# Patient Record
Sex: Female | Born: 1960
Health system: Southern US, Community
[De-identification: ages and names within clinical notes are randomized; demographics above are authoritative.]

## PROBLEM LIST (undated history)

## (undated) DIAGNOSIS — E785 Hyperlipidemia, unspecified: Secondary | ICD-10-CM

## (undated) DIAGNOSIS — N3010 Interstitial cystitis (chronic) without hematuria: Secondary | ICD-10-CM

## (undated) DIAGNOSIS — L259 Unspecified contact dermatitis, unspecified cause: Secondary | ICD-10-CM

## (undated) DIAGNOSIS — R7303 Prediabetes: Secondary | ICD-10-CM

## (undated) DIAGNOSIS — K469 Unspecified abdominal hernia without obstruction or gangrene: Secondary | ICD-10-CM

## (undated) DIAGNOSIS — J309 Allergic rhinitis, unspecified: Secondary | ICD-10-CM

## (undated) DIAGNOSIS — R0789 Other chest pain: Secondary | ICD-10-CM

## (undated) DIAGNOSIS — T7840XA Allergy, unspecified, initial encounter: Secondary | ICD-10-CM

## (undated) DIAGNOSIS — Z86010 Personal history of colonic polyps: Secondary | ICD-10-CM

## (undated) DIAGNOSIS — R400 Somnolence: Secondary | ICD-10-CM

## (undated) DIAGNOSIS — K439 Ventral hernia without obstruction or gangrene: Secondary | ICD-10-CM

## (undated) DIAGNOSIS — J4530 Mild persistent asthma, uncomplicated: Secondary | ICD-10-CM

## (undated) DIAGNOSIS — Z8619 Personal history of other infectious and parasitic diseases: Secondary | ICD-10-CM

## (undated) DIAGNOSIS — G4730 Sleep apnea, unspecified: Secondary | ICD-10-CM

## (undated) DIAGNOSIS — E782 Mixed hyperlipidemia: Secondary | ICD-10-CM

## (undated) DIAGNOSIS — F329 Major depressive disorder, single episode, unspecified: Secondary | ICD-10-CM

## (undated) DIAGNOSIS — J301 Allergic rhinitis due to pollen: Secondary | ICD-10-CM

## (undated) DIAGNOSIS — K219 Gastro-esophageal reflux disease without esophagitis: Secondary | ICD-10-CM

## (undated) HISTORY — PX: POLYPECTOMY: SHX149

## (undated) HISTORY — PX: COLONOSCOPY W/ POLYPECTOMY: SHX1380

## (undated) HISTORY — DX: Major depressive disorder, single episode, unspecified: F32.9

## (undated) HISTORY — PX: CYSTOSCOPY: SUR368

## (undated) HISTORY — DX: Personal history of colonic polyps: Z86.010

## (undated) HISTORY — DX: Allergy, unspecified, initial encounter: T78.40XA

## (undated) HISTORY — DX: Hyperlipidemia, unspecified: E78.5

## (undated) HISTORY — DX: Mixed hyperlipidemia: E78.2

## (undated) HISTORY — DX: Unspecified contact dermatitis, unspecified cause: L25.9

## (undated) HISTORY — DX: Other chest pain: R07.89

## (undated) HISTORY — DX: Gastro-esophageal reflux disease without esophagitis: K21.9

## (undated) HISTORY — DX: Personal history of other infectious and parasitic diseases: Z86.19

## (undated) HISTORY — DX: Sleep apnea, unspecified: G47.30

## (undated) HISTORY — PX: OTHER SURGICAL HISTORY: SHX169

## (undated) HISTORY — DX: Allergic rhinitis due to pollen: J30.1

## (undated) HISTORY — DX: Mild persistent asthma, uncomplicated: J45.30

## (undated) HISTORY — PX: COLONOSCOPY: SHX174

---
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
## 1998-10-08 ENCOUNTER — Emergency Department (HOSPITAL_COMMUNITY): Admission: EM | Admit: 1998-10-08 | Discharge: 1998-10-08 | Payer: Self-pay | Admitting: Emergency Medicine

## 2000-11-12 HISTORY — PX: CHOLECYSTECTOMY: SHX55

## 2001-11-12 HISTORY — PX: HERNIA REPAIR: SHX51

## 2002-02-05 ENCOUNTER — Other Ambulatory Visit: Admission: RE | Admit: 2002-02-05 | Discharge: 2002-02-05 | Payer: Self-pay | Admitting: Obstetrics & Gynecology

## 2002-02-09 ENCOUNTER — Encounter: Admission: RE | Admit: 2002-02-09 | Discharge: 2002-02-09 | Payer: Self-pay | Admitting: Internal Medicine

## 2002-02-09 ENCOUNTER — Encounter: Payer: Self-pay | Admitting: Internal Medicine

## 2002-04-13 ENCOUNTER — Encounter: Payer: Self-pay | Admitting: General Surgery

## 2002-04-13 ENCOUNTER — Encounter (INDEPENDENT_AMBULATORY_CARE_PROVIDER_SITE_OTHER): Payer: Self-pay | Admitting: Specialist

## 2002-04-13 ENCOUNTER — Observation Stay (HOSPITAL_COMMUNITY): Admission: RE | Admit: 2002-04-13 | Discharge: 2002-04-14 | Payer: Self-pay | Admitting: General Surgery

## 2003-09-15 ENCOUNTER — Ambulatory Visit (HOSPITAL_COMMUNITY): Admission: RE | Admit: 2003-09-15 | Discharge: 2003-09-15 | Payer: Self-pay | Admitting: Occupational Medicine

## 2006-03-28 ENCOUNTER — Emergency Department (HOSPITAL_COMMUNITY): Admission: EM | Admit: 2006-03-28 | Discharge: 2006-03-28 | Payer: Self-pay | Admitting: Family Medicine

## 2006-07-19 ENCOUNTER — Emergency Department (HOSPITAL_COMMUNITY): Admission: EM | Admit: 2006-07-19 | Discharge: 2006-07-19 | Payer: Self-pay | Admitting: Emergency Medicine

## 2006-07-25 ENCOUNTER — Ambulatory Visit: Payer: Self-pay | Admitting: Internal Medicine

## 2006-08-15 ENCOUNTER — Ambulatory Visit: Payer: Self-pay | Admitting: Internal Medicine

## 2006-09-24 ENCOUNTER — Ambulatory Visit: Payer: Self-pay | Admitting: Internal Medicine

## 2007-07-22 ENCOUNTER — Ambulatory Visit (HOSPITAL_COMMUNITY): Admission: RE | Admit: 2007-07-22 | Discharge: 2007-07-22 | Payer: Self-pay | Admitting: Family Medicine

## 2008-04-12 ENCOUNTER — Emergency Department (HOSPITAL_COMMUNITY): Admission: EM | Admit: 2008-04-12 | Discharge: 2008-04-12 | Payer: Self-pay | Admitting: Emergency Medicine

## 2008-09-17 ENCOUNTER — Ambulatory Visit (HOSPITAL_BASED_OUTPATIENT_CLINIC_OR_DEPARTMENT_OTHER): Admission: RE | Admit: 2008-09-17 | Discharge: 2008-09-17 | Payer: Self-pay | Admitting: Urology

## 2008-09-17 ENCOUNTER — Encounter (INDEPENDENT_AMBULATORY_CARE_PROVIDER_SITE_OTHER): Payer: Self-pay | Admitting: Urology

## 2011-03-27 NOTE — Op Note (Signed)
Donna Willis, Donna Willis                ACCOUNT NO.:  [id number]   MEDICAL RECORD NO.:  [id number]          PATIENT TYPE:  AMB   LOCATION:  [facility]                         FACILITY:  [facility]   PHYSICIAN:  Ronald L. Earlene Plater, M.D.  DATE OF BIRTH:  1961/10/16   DATE OF PROCEDURE:  09/17/2008  DATE OF DISCHARGE:                               OPERATIVE REPORT   DIAGNOSIS:  Probable interstitial cystitis.   OPERATIVE PROCEDURE:  Cystourethroscopy, hydraulic bladder distention,  bladder biopsies and bladder washings.   SURGEON:  Lucrezia Starch. Earlene Plater, M.D.   ANESTHESIA:  LMA.   FINDINGS:  A 600 mL capacity bladder at 80 cm of water pressure, cracks  and submucosal glomerulation noted.   ESTIMATED BLOOD LOSS:  Minimal.   TUBES:  None.   COMPLICATIONS:  None.   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  She notes that she has had hematuria x2 in  the past.  The situation has progressively worsened.  She has suprapubic  pain relieved by voiding and voids every 2 hours with nocturia times  two.  She has had an NMP-22 in the office which was negative.  She had  renal ultrasound that revealed no significant problems.  After  understanding risks, benefits and alternatives she has elected to  proceed with Cysto, hydraulic bladder distention and bladder biopsy for  both diagnostic and therapeutic purposes.   PROCEDURE IN DETAIL:  The patient was placed in supine position.  After  proper LMA anesthesia was placed in the dorsal lithotomy position and  prepped and draped with Betadine in a sterile fashion.  Cystourethroscopy was performed with a 22.5 French Olympus panendoscope  utilizing the 12 and 70 degrees lenses.  The bladder was carefully  inspected.  Efflux of clear urine was noted from the normally placed  orifices bilaterally.  There was impression that appeared to be a  uterine  impression on the lower bladder.  In the posterior midline there  was some raised reddish areas that appeared to be inflammatory, but  carcinoma in situ was a remote possibility.  Hydraulic bladder  distention was then performed to 80 cm of water pressure.  She was noted  to have a 600 mL bladder capacity and when the pressure was relieved  cracks and glomerulation were noted diffusely.  There was some bleeding  noted from them.  A biopsy was taken of the posterior midline from the  area of maximum  erythema and  the base was cauterized with Bugbee coagulation cautery.  Barbotage  bladder cytologies were then performed with normal saline and submitted  for cytology.  The bladder was drained.  The panendoscope was removed.  The patient was taken to the recovery room stable.      Ronald L. Earlene Plater, M.D.  Electronically Signed     RLD/MEDQ  D:  09/17/2008  T:  09/17/2008  Job:  [id number]

## 2011-03-30 NOTE — Assessment & Plan Note (Signed)
[facility]                           GASTROENTEROLOGY OFFICE NOTE   Donna Willis, Donna Willis                       MRN:          [id number]  DATE:07/25/2006                            DOB:          [birth year]    REFERRING PHYSICIAN:  Jonita Albee, M.D.   OFFICE CONSULTATION:   PROBLEM:  Left-sided abdominal pain for 3 weeks with nausea and fullness.   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  She has a history of reactive airway disease.  She is  status post cholecystectomy in 2003 and had an umbilical hernia in 2003.  Has had one remote C-section.  She has not had any prior GI problems or  prior GI evaluation.  She says her current symptoms started about 3 weeks  ago and has had some intermittent lower abdominal discomfort along with a  constant sense of fullness and swelling or pressure on the left side of her  abdomen.  She does feel that this is exacerbated some by straining with  bowel movements.  She has not noted any change in discomfort or bloating  with p.o. intake though her appetite has been quite poor and she says she  feels full all over her abdomen.  She says she eats a little bit and feels  like it is backing up.  She has been nauseated and has vomited on a few  occasions, though not daily.  Her weight has remained fairly stable.  She  has not had any bowel movement over the past 2 days but prior to that had  been having some intermittent loose stools, no melena or hematochezia.  She  has not had any fever or chills with this illness, no sweats.  She also  mentioned some urinary frequency and pressure type of symptoms.  She has not  had any history of ureterolithiasis.   The patient has had a GYN evaluation since onset of her pain.  She is not  felt to have a GYN etiology.  She underwent CT scan of the abdomen and  pelvis on July 21, 2006, which was really unremarkable.  She  does have a  very small umbilical hernia containing only mesenteric fat.  Bowel loops had  normal appearance.  There was no diverticulosis mentioned, no adenopathy or  ascites.   Laboratory studies on September 11:  Glucose 98, creatinine 0.8.  Liver  function studies normal.  Amylase 42, lipase 22.  WBC of 6.5, hemoglobin 14,  hematocrit of 40, MCV of 85.  Sedimentation rate was 51.  A UA showed light  bacteria, 3-5 wbc's.   The patient was started on a course of Flagyl 500 mg b.i.d. and Bactrim  b.i.d. 4 days ago.  Thus far she has not really noticed any improvement  symptoms, though feels that she has been perhaps more nauseated.   CURRENT MEDICATIONS:  1. Bactrim one p.o. b.i.d.  2. Phenergan 25 mg q.4-6h. p.r.n.  3. Flagyl 500 mg b.i.d.  4. Darvocet-N 100 p.r.n.   ALLERGIES/INTOLERANCES:  PENICILLIN and  CIPRO.  She says she had some  itching with CIPRO once before but has since taken Cipro-E and did fine with  that.   PAST HISTORY:  As outlined above.   SOCIAL HISTORY:  The patient is married, has 1 child.  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  She is a nonsmoker, nondrinker.   FAMILY HISTORY:  Pertinent for diabetes in her grandparents, heart disease  in her grandfather.  Mother had colon polyps.  No family history of  inflammatory bowel disease.  Mother also has been diagnosed with  amyloidosis.   REVIEW OF SYSTEMS:  Entirely negative other than outlined above.   PHYSICAL EXAMINATION:  GENERAL:  A well-developed white female in no acute  distress  VITAL SIGNS:  Height is 5 feet 6 inches, weight is 221, blood pressure  121/78, pulse is 56.  HEENT:  Nontraumatic, normocephalic.  EOMI.  PERRLA.  Sclerae are anicteric.  CARDIOVASCULAR:  Regular rate and rhythm with S1 and S2.  PULMONARY:  Clear to A&P.  ABDOMEN:  Soft, bowel sounds are active.  She is tender in the left  midquadrant, left lower quadrant.  There is no guarding or rebound, no mass  or  palpable hepatosplenomegaly.  No lesions noted.  RECTAL:  Not repeated.  Hemoccult-negative per Dr. Perrin Maltese according to the  patient.   IMPRESSION:  A 50 year old female with 3-week history of left lower  abdominal pain, generalized abdominal fullness and nausea, with negative CT  scan and elevated sedimentation rate.  Etiology of her GI symptoms is not  clear at this time.  There is no evidence for any definite  inflammatory/infectious process on CT scan.  Question viral syndrome.  Question subacute diverticulitis.   PLAN:  1. Finish course of Flagyl 500 mg b.i.d. x14 days and Bactrim b.i.d. x14      days.  2. Phenergan on a p.r.n. basis.  3. Samples of Nexium for 40 mg p.o. q.a.m.  4. Plan return office visit in one week.  She was asked to call in the      interim should her symptoms worsen.  Depending on her response to the      antibiotics, will decide regarding need for colonoscopy plus/minus EGD      at the time of her next visit.                                  Amy Esterwood, PA-C                                Iva Boop, MD,FACG   AE/MedQ  DD:  07/25/2006  DT:  07/26/2006  Job #:  [id number]

## 2011-03-30 NOTE — Op Note (Signed)
[facility]  Patient:    Donna Willis, GARBETT Visit Number: [id number] MRN: [id number]          Service Type: SUR Location: 4W [id number] Attending Physician:  Delsa Bern Dictated by:   Lorne Skeens. Hoxworth, M.D. Proc. Date: 04/13/02 Admit Date:  04/13/2002                             Operative Report  PREOPERATIVE DIAGNOSES: 1. Cholelithiasis. 2. Cholecystitis.  POSTOPERATIVE DIAGNOSES: 1. Cholelithiasis. 2. Cholecystitis. 3. Umbilical hernia.  PROCEDURES: 1. Laparoscopic cholecystectomy with intraoperative cholangiogram. 2. Repair of umbilical hernia.  SURGEON:  Lorne Skeens. Hoxworth, M.D.  ASSISTANT:  Vikki Ports, M.D.  ANESTHESIA:  General.  BRIEF HISTORY:  The patient is a 50 year old white female who recently presented with right flank and back pain to her primary care physician. CT scan was obtained which was unremarkable except for cholelithiasis. On questioning, she has had definite episodes of right upper quadrant, right flank, and right back pain entirely consistent with biliary colic. Laparoscopic cholecystectomy with cholangiogram has been recommended and accepted.  NATURE OF THE PROCEDURE/INDICATIONS:  The risks of bleeding, infection, bile leak, and bile duct injury were discussed and understood. She is now brought to the operating room for the procedure.  PROCEDURE IN DETAIL:  The patient was brought to the operating room and placed in supine position on the operating table and general endotracheal anesthesia was induced. She received preoperative antibiotics. PAS were placed. The abdomen was sterilely prepped and draped. Local anesthesia was used to infiltrate the trocar sites. A 1 cm incision was made at the umbilicus. An umbilical hernia was palpable at this point. Dissection was carried down to the sac which was dissected away from the umbilical skin. This was mainly herniated preperitoneal fat.  Dissection was carried down through this to the peritoneum which was entered. Two stay sutures of 2-0 Vicryl were placed. The Hasson trocar inserted and pneumoperitoneum established. Under direct vision, a 10 mm trocar was placed in the subxiphoid area. Two 5 mm trocars on the right subcostal margin. The gallbladder was visualized and was definitely chronically thickened and inflamed. Some minor omental adhesions were dissected off the infundibulum. The fundus was grasped and elevated above the liver. The infundibulum retracted inferolaterally. Fibrofatty tissue was then stripped off the neck of the gallbladder toward the porta hepatis. The distal gallbladder and enclosed triangle was thoroughly dissected. The cystic artery was seen coursing up under the gallbladder wall. The distal gallbladder cystic duct trench was identified. The cystic duct appeared to be possibly somewhat short with a slightly tented common bile duct in the field. Dissection was kept close to the gallbladder cystic duct junction and the cystic duct was encircled at this point or was smaller in diameter and clipped to the gallbladder junction. The cystic artery was proximally clipped distally. Operative cholangiograms were then obtained through the cystic duct. This showed good filling of the normal small common bile duct, common hepatic duct, and intrahepatic ducts with free flow into the duodenum and no filling defects. Following this, the cholangiocatheter was removed, and the cystic duct was doubly clipped proximally and divided. The cystic artery was divided. The gallbladder was then dissected free from its bed using a hook cautery and removed through the umbilicus. Complete hemostasis was obtained in the gallbladder bed then right upper quadrant irrigated and suctioned until clear. At this point, the fascial edges at the  umbilicus were dissected back about 1 cm in all directions to allow closure. The umbilical  defect was then closed with four interrupted 0 Vicryl sutures transversely. Pneumoperitoneum was reestablished with camera through the subxiphoid port and the area was reinspected and there was no trapped viscera or defects left. The lateral trocars were removed under direct vision and all CO2 evacuated from the peritoneal cavity, and the subxiphoid trocar removed. The skin incisions were closed with interrupted subcuticular 4-0 Monocryl and Steri-Strips. Sponge, instrument, and needle counts were correct. Compressive dressing was applied, and the patient taken to the recovery room in good condition. Dictated by:   Lorne Skeens. Hoxworth, M.D. Attending Physician:  Delsa Bern DD:  04/13/02 TD:  04/14/02 Job: [id number]

## 2011-03-30 NOTE — Assessment & Plan Note (Signed)
[facility]                           GASTROENTEROLOGY OFFICE NOTE   SHAYLON, GILLEAN                       MRN:          [id number]  DATE:09/24/2006                            DOB:          1961/04/22    Miss Bias returns for followup of her abdominal pain and change in bowel  habit syndrome with elevated sed rate. She is completely well at this time.  It is unclear what she had, but presumably she had some sort of infectious  syndrome. Her weight is stable. There are no other issues at this time.   Blood pressure is 110/70, pulse is 64.   ASSESSMENT:  Presumed infectious diarrhea syndrome with left lower quadrant  pain. Resolved.   PLAN:  Followup as needed, routine screening colonoscopy at age 4.   I advised her that if she had recurrent symptoms as sometimes inflammatory  bowel disease could start out this way, she should return to see me sooner  as well. I think she is going to be fine and this is some sort of transient  infectious/inflammatory process.     Iva Boop, MD,FACG  Electronically Signed    CEG/MedQ  DD: 09/24/2006  DT: 09/24/2006  Job #: [id number]   cc:   Jonita Albee, M.D.

## 2011-03-30 NOTE — Assessment & Plan Note (Signed)
[facility]                           GASTROENTEROLOGY OFFICE NOTE   Donna, Willis                       MRN:          [id number]  DATE:08/15/2006                            DOB:          1961/04/10    CHIEF COMPLAINT:  Followup of abdominal pain, change in bowels.   HISTORY OF PRESENT ILLNESS:  Donna Willis was seen to evaluate left lower  quadrant pain and elevated sedimentation rate.  A CT was unrevealing.  When  I saw her about 3-4 weeks ago, she had been treated with Flagyl and Bactrim  by Dr. Perrin Maltese and was given some Nexium.  In that time period, she has  generally improved, but still eating just soup and crackers.  She has lost  some weight down about 6 pounds with the diet change.  There is no pain  anymore.  She has not had fever.  She has some mildly loose stools, but is  not really having any difficulties.  She is on no medication at this time.  She finished her antibiotics.   PHYSICAL EXAMINATION:  GENERAL:  An obese, well-developed, white woman.  She  looks well.  VITAL SIGNS:  Pulse 56, blood pressure 120/78, weight 215.  ABDOMEN:  Soft, nontender without organomegaly, mass or hernia.   ASSESSMENT:  Left lower quadrant pain, nausea which has resolved, although  she has not really been able to advance her diet back to normal.  She had an  elevated sedimentation rate of unclear etiology.  I am not exactly sure what  her syndrome was.  She is only 45.  There are just mild bowel habit changes  and it sounds to me like she may have had some sort of viral syndrome or  infection that is resolving.  She has not had any bleeding or other  worrisome features.  She has lost some weight, but I think that can be  explained with what we know at this point.   PLAN:  1. Recheck a CBC and a sedimentation rate.  2. See me at the end of October.  She is planning a trip to [place] in      early November.  If things change or worsen, she is to call  me back.      As long as she continues to improve, I would not investigate further.       Donna Boop, MD,FACG    CEG/MedQ  DD:  08/17/2006  DT:  08/19/2006  Job #:  [id number]   cc:   Donna Willis, M.D.

## 2011-08-14 LAB — POCT HEMOGLOBIN-HEMACUE: Hemoglobin: 14.3

## 2012-06-29 ENCOUNTER — Ambulatory Visit (INDEPENDENT_AMBULATORY_CARE_PROVIDER_SITE_OTHER): Payer: 59 | Admitting: Emergency Medicine

## 2012-06-29 ENCOUNTER — Ambulatory Visit: Payer: 59

## 2012-06-29 VITALS — BP 121/77 | HR 88 | Temp 98.1°F | Resp 14 | Ht 65.0 in | Wt 231.6 lb

## 2012-06-29 DIAGNOSIS — K219 Gastro-esophageal reflux disease without esophagitis: Secondary | ICD-10-CM

## 2012-06-29 DIAGNOSIS — R05 Cough: Secondary | ICD-10-CM

## 2012-06-29 MED ORDER — OMEPRAZOLE 20 MG PO CPDR: 20.0000 mg | DELAYED_RELEASE_CAPSULE | Freq: Every day | ORAL | Status: DC

## 2012-06-29 MED ORDER — BENZONATATE 100 MG PO CAPS: 100.0000 mg | ORAL_CAPSULE | Freq: Three times a day (TID) | ORAL | Status: AC | PRN

## 2012-06-29 NOTE — Progress Notes (Signed)
  Subjective:    Patient ID: Donna Willis, female    DOB: July 14, 1961, 51 y.o.   MRN: [id number]  HPI patient presents with a cough which has been present for one month. Her cough is nonproductive enhancement associated with a good amount of heartburn she has been taking Pepcid for this. She has tried an albuterol inhaler and this seems to help some    Review of Systems     Objective:   Physical Exam alert cooperative female who is not in distress. Chest is clear. I didn't hear any audible wheezes. There may be a slight prolongation of expiration . There is no murmur audible.   [facility] reading (PRIMARY) by  Dr. Cleta Alberts no acute disease  Results for orders placed during the hospital encounter of 09/17/08  POCT PREGNANCY, URINE      Component Value Range   Preg Test, Ur NEGATIVE    POCT HEMOGLOBIN-HEMACUE      Component Value Range   Hemoglobin 14.3          Assessment  & Plan:     I suspect her cough is secondary to reflux. We'll treat with omeprazole 20 one a day along with her Pepcid one a day have given her instructions regarding treatment of her cough and treatment for reflux.

## 2012-06-29 NOTE — Patient Instructions (Signed)
Gastroesophageal Reflux Disease, Adult Gastroesophageal reflux disease (GERD) happens when acid from your stomach flows up into the esophagus. When acid comes in contact with the esophagus, the acid causes soreness (inflammation) in the esophagus. Over time, GERD may create small holes (ulcers) in the lining of the esophagus. CAUSES   Increased body weight. This puts pressure on the stomach, making acid rise from the stomach into the esophagus.   Smoking. This increases acid production in the stomach.   Drinking alcohol. This causes decreased pressure in the lower esophageal sphincter (valve or ring of muscle between the esophagus and stomach), allowing acid from the stomach into the esophagus.   Late evening meals and a full stomach. This increases pressure and acid production in the stomach.   A malformed lower esophageal sphincter.  Sometimes, no cause is found. SYMPTOMS   Burning pain in the lower part of the mid-chest behind the breastbone and in the mid-stomach area. This may occur twice a week or more often.   Trouble swallowing.   Sore throat.   Dry cough.   Asthma-like symptoms including chest tightness, shortness of breath, or wheezing.  DIAGNOSIS  Your caregiver may be able to diagnose GERD based on your symptoms. In some cases, X-rays and other tests may be done to check for complications or to check the condition of your stomach and esophagus. TREATMENT  Your caregiver may recommend over-the-counter or prescription medicines to help decrease acid production. Ask your caregiver before starting or adding any new medicines.  HOME CARE INSTRUCTIONS   Change the factors that you can control. Ask your caregiver for guidance concerning weight loss, quitting smoking, and alcohol consumption.   Avoid foods and drinks that make your symptoms worse, such as:   Caffeine or alcoholic drinks.   Chocolate.   Peppermint or mint flavorings.   Garlic and onions.   Spicy foods.     Citrus fruits, such as oranges, lemons, or limes.   Tomato-based foods such as sauce, chili, salsa, and pizza.   Fried and fatty foods.   Avoid lying down for the 3 hours prior to your bedtime or prior to taking a nap.   Eat small, frequent meals instead of large meals.   Wear loose-fitting clothing. Do not wear anything tight around your waist that causes pressure on your stomach.   Raise the head of your bed 6 to 8 inches with wood blocks to help you sleep. Extra pillows will not help.   Only take over-the-counter or prescription medicines for pain, discomfort, or fever as directed by your caregiver.   Do not take aspirin, ibuprofen, or other nonsteroidal anti-inflammatory drugs (NSAIDs).  SEEK IMMEDIATE MEDICAL CARE IF:   You have pain in your arms, neck, jaw, teeth, or back.   Your pain increases or changes in intensity or duration.   You develop nausea, vomiting, or sweating (diaphoresis).   You develop shortness of breath, or you faint.   Your vomit is green, yellow, black, or looks like coffee grounds or blood.   Your stool is red, bloody, or black.  These symptoms could be signs of other problems, such as heart disease, gastric bleeding, or esophageal bleeding. MAKE SURE YOU:   Understand these instructions.   Will watch your condition.   Will get help right away if you are not doing well or get worse.  Document Released: 08/08/2005 Document Revised: 10/18/2011 Document Reviewed: 05/18/2011 [facility] Patient Information 2012 [place], [place].Cough, Adult  A cough is a reflex that helps  clear your throat and airways. It can help heal the body or may be a reaction to an irritated airway. A cough may only last 2 or 3 weeks (acute) or may last more than 8 weeks (chronic).  CAUSES Acute cough:  Viral or bacterial infections.  Chronic cough:  Infections.   Allergies.   Asthma.   Post-nasal drip.   Smoking.   Heartburn or acid reflux.   Some medicines.    Chronic lung problems (COPD).   Cancer.  SYMPTOMS   Cough.   Fever.   Chest pain.   Increased breathing rate.   High-pitched whistling sound when breathing (wheezing).   Colored mucus that you cough up (sputum).  TREATMENT   A bacterial cough may be treated with antibiotic medicine.   A viral cough must run its course and will not respond to antibiotics.   Your caregiver may recommend other treatments if you have a chronic cough.  HOME CARE INSTRUCTIONS   Only take over-the-counter or prescription medicines for pain, discomfort, or fever as directed by your caregiver. Use cough suppressants only as directed by your caregiver.   Use a cold steam vaporizer or humidifier in your bedroom or home to help loosen secretions.   Sleep in a semi-upright position if your cough is worse at night.   Rest as needed.   Stop smoking if you smoke.  SEEK IMMEDIATE MEDICAL CARE IF:   You have pus in your sputum.   Your cough starts to worsen.   You cannot control your cough with suppressants and are losing sleep.   You begin coughing up blood.   You have difficulty breathing.   You develop pain which is getting worse or is uncontrolled with medicine.   You have a fever.  MAKE SURE YOU:   Understand these instructions.   Will watch your condition.   Will get help right away if you are not doing well or get worse.  Document Released: 04/27/2011 Document Revised: 10/18/2011 Document Reviewed: 04/27/2011 [facility] Patient Information 2012 [place], [place].

## 2012-08-12 DIAGNOSIS — R0789 Other chest pain: Secondary | ICD-10-CM

## 2012-08-12 HISTORY — DX: Other chest pain: R07.89

## 2012-08-18 ENCOUNTER — Ambulatory Visit (INDEPENDENT_AMBULATORY_CARE_PROVIDER_SITE_OTHER): Payer: 59 | Admitting: Family Medicine

## 2012-08-18 ENCOUNTER — Inpatient Hospital Stay (HOSPITAL_COMMUNITY)
Admission: EM | Admit: 2012-08-18 | Discharge: 2012-08-21 | DRG: 355 | Disposition: A | Discharge status: Home / Self Care | Payer: 59 | Attending: General Surgery | Admitting: General Surgery

## 2012-08-18 ENCOUNTER — Encounter (HOSPITAL_COMMUNITY): Payer: Self-pay

## 2012-08-18 ENCOUNTER — Telehealth: Payer: Self-pay

## 2012-08-18 VITALS — BP 148/82 | HR 78 | Temp 98.2°F | Resp 17 | Ht 65.0 in | Wt 235.0 lb

## 2012-08-18 DIAGNOSIS — K420 Umbilical hernia with obstruction, without gangrene: Secondary | ICD-10-CM

## 2012-08-18 DIAGNOSIS — K219 Gastro-esophageal reflux disease without esophagitis: Secondary | ICD-10-CM | POA: Diagnosis present

## 2012-08-18 DIAGNOSIS — N3010 Interstitial cystitis (chronic) without hematuria: Secondary | ICD-10-CM | POA: Diagnosis present

## 2012-08-18 DIAGNOSIS — Z6839 Body mass index (BMI) 39.0-39.9, adult: Secondary | ICD-10-CM

## 2012-08-18 DIAGNOSIS — R109 Unspecified abdominal pain: Secondary | ICD-10-CM

## 2012-08-18 DIAGNOSIS — K439 Ventral hernia without obstruction or gangrene: Secondary | ICD-10-CM

## 2012-08-18 DIAGNOSIS — Z23 Encounter for immunization: Secondary | ICD-10-CM

## 2012-08-18 HISTORY — DX: Unspecified abdominal hernia without obstruction or gangrene: K46.9

## 2012-08-18 HISTORY — DX: Interstitial cystitis (chronic) without hematuria: N30.10

## 2012-08-18 LAB — POCT UA - MICROSCOPIC ONLY
Casts, Ur, LPF, POC: NEGATIVE
Crystals, Ur, HPF, POC: NEGATIVE
Mucus, UA: NEGATIVE

## 2012-08-18 LAB — POCT URINALYSIS DIPSTICK
Ketones, UA: NEGATIVE
Protein, UA: NEGATIVE
Spec Grav, UA: 1.015
pH, UA: 6

## 2012-08-18 LAB — POCT CBC
HCT, POC: 43.7 % (ref 37.7–47.9)
Hemoglobin: 13.2 g/dL (ref 12.2–16.2)
MCH, POC: 26.5 pg — AB (ref 27–31.2)
MCV: 87.7 fL (ref 80–97)
RBC: 4.98 M/uL (ref 4.04–5.48)
WBC: 7.4 10*3/uL (ref 4.6–10.2)

## 2012-08-18 LAB — BASIC METABOLIC PANEL
CO2: 23 mEq/L (ref 19–32)
Calcium: 9.1 mg/dL (ref 8.4–10.5)
Glucose, Bld: 100 mg/dL — ABNORMAL HIGH (ref 70–99)
Sodium: 138 mEq/L (ref 135–145)

## 2012-08-18 LAB — SURGICAL PCR SCREEN
MRSA, PCR: NEGATIVE
Staphylococcus aureus: NEGATIVE

## 2012-08-18 MED ORDER — INFLUENZA VIRUS VACC SPLIT PF IM SUSP
0.5000 mL | INTRAMUSCULAR | Status: AC
  Administered 2012-08-19: 0.5 mL via INTRAMUSCULAR
  Filled 2012-08-18: qty 0.5

## 2012-08-18 MED ORDER — OXYCODONE-ACETAMINOPHEN 5-325 MG PO TABS: 1.0000 | ORAL_TABLET | ORAL | Status: DC | PRN

## 2012-08-18 MED ORDER — ONDANSETRON HCL 4 MG/2ML IJ SOLN: 4.0000 mg | Freq: Four times a day (QID) | INTRAMUSCULAR | Status: DC | PRN

## 2012-08-18 MED ORDER — MORPHINE SULFATE 2 MG/ML IJ SOLN
1.0000 mg | INTRAMUSCULAR | Status: DC | PRN
  Administered 2012-08-18: 2 mg via INTRAMUSCULAR

## 2012-08-18 MED ORDER — MORPHINE SULFATE 2 MG/ML IJ SOLN
1.0000 mg | INTRAMUSCULAR | Status: DC | PRN
  Administered 2012-08-18 – 2012-08-19 (×2): 2 mg via INTRAVENOUS
  Filled 2012-08-18 (×3): qty 1

## 2012-08-18 MED ORDER — KCL IN DEXTROSE-NACL 20-5-0.45 MEQ/L-%-% IV SOLN
INTRAVENOUS | Status: DC
  Administered 2012-08-19: 06:00:00 via INTRAVENOUS
  Filled 2012-08-18 (×4): qty 1000

## 2012-08-18 MED ORDER — DIPHENHYDRAMINE HCL 12.5 MG/5ML PO ELIX: 12.5000 mg | ORAL_SOLUTION | Freq: Four times a day (QID) | ORAL | Status: DC | PRN

## 2012-08-18 MED ORDER — DEXTROSE 5 % IV SOLN
2.0000 g | INTRAVENOUS | Status: AC
  Filled 2012-08-18: qty 2

## 2012-08-18 MED ORDER — DIPHENHYDRAMINE HCL 50 MG/ML IJ SOLN: 12.5000 mg | Freq: Four times a day (QID) | INTRAMUSCULAR | Status: DC | PRN

## 2012-08-18 MED ORDER — PANTOPRAZOLE SODIUM 40 MG IV SOLR
40.0000 mg | Freq: Every day | INTRAVENOUS | Status: DC
  Administered 2012-08-18 – 2012-08-20 (×3): 40 mg via INTRAVENOUS
  Filled 2012-08-18 (×4): qty 40

## 2012-08-18 NOTE — Telephone Encounter (Signed)
She was sent to [facility] ER today , hopefully he has found her. I have left message for him to call me.

## 2012-08-18 NOTE — H&P (Signed)
Donna Willis 1961-02-26  [id number].   Room - 1527 PCP - [facility] - Dr. Renato Shin Uro - Dr. Vania Rea  Chief Complaint/Reason for Consult: abdominal pain with hernia HPI: This is a 51 year old female who underwent a laparoscopic cholecystectomy in 2002. At that time, she was noted to have an umbilical hernia which was primarily repaired during the closure of her umbilical incision. She has been doing well since that time. Over the last several months she has noted a bulge, consistent with a hernia that has not had any issues with this. Yesterday he she began noticing some abdominal pain at this location. She ate solid food for lunch time but only a popsicle at dinner secondary to worsening abdominal pain. She did have a bowel movement yesterday, but states this was a little bit loose. Her pain persisted overnight. She has had worsening nausea but no emesis. She went to urgent care today where she was found to have an incarcerated umbilical hernia. We were contacted, and informed the urgent care to some the patient urgently to [facility] emergency department.  Review of systems: Please see history of present illness, otherwise all other systems have been reviewed and are negative.  Family History  Problem Relation Age of Onset  . Colon polyps Mother   . Diverticulitis Father     Past Medical History  Diagnosis Date  . GERD (gastroesophageal reflux disease)   . Hernia   . Interstitial cystitis     Past Surgical History  Procedure Date  . Cholecystectomy   . Cesarean section   . Tonsillectomy and adenoidectomy   . Hernia repair     umbilical, during lap chole    Social History:  reports that she has never smoked. She has never used smokeless tobacco. She reports that she drinks alcohol. She reports that she does not use illicit drugs.  Allergies:  Allergies  Allergen Reactions  . Ciprofloxacin Itching  . Penicillins Itching     (Not in a hospital  admission)  Blood pressure 131/76, pulse 70, temperature 98.2 F (36.8 C), temperature source Oral, resp. rate 18, last menstrual period 08/04/2012, SpO2 100.00%. Physical Exam: General: pleasant, WD, WN white female who is laying in bed in NAD HEENT: head is normocephalic, atraumatic.  Sclera are noninjected.  PERRL.  Ears and nose without any masses or lesions.  Mouth is pink and moist Heart: regular, rate, and rhythm.  Normal s1,s2. No obvious murmurs, gallops, or rubs noted.  Palpable radial and pedal pulses bilaterally Lungs: CTAB, no wheezes, rhonchi, or rales noted.  Respiratory effort nonlabored Abd: soft, mild distention, hypoactive BS, no masses or organomegaly. The patient has a moderate size incarcerated umbilical hernia. This is very tender and unable to be reduced secondary to pain. This is still mildly soft and not hard yet. There is no erythematous changes to her skin. MS: all 4 extremities are symmetrical with no cyanosis, clubbing, or edema. Skin: warm and dry with no masses, lesions, or rashes Psych: A&Ox3 with an appropriate affect.    Results for orders placed in visit on 08/18/12 (from the past 48 hour(s))  POCT CBC     Status: Abnormal   Collection Time   08/18/12 11:38 AM      Component Value Range Comment   WBC 7.4  4.6 - 10.2 K/uL    Lymph, poc 1.6  0.6 - 3.4    POC LYMPH PERCENT 21.9  10 - 50 %L  MID (cbc) 0.3  0 - 0.9    POC MID % 4.6  0 - 12 %M    POC Granulocyte 5.4  2 - 6.9    Granulocyte percent 73.5  37 - 80 %G    RBC 4.98  4.04 - 5.48 M/uL    Hemoglobin 13.2  12.2 - 16.2 g/dL    HCT, POC 19.1  47.8 - 47.9 %    MCV 87.7  80 - 97 fL    MCH, POC 26.5 (*) 27 - 31.2 pg    MCHC 30.2 (*) 31.8 - 35.4 g/dL    RDW, POC 29.5      Platelet Count, POC 299  142 - 424 K/uL    MPV 9.1  0 - 99.8 fL   POCT UA - MICROSCOPIC ONLY     Status: Normal   Collection Time   08/18/12 11:38 AM      Component Value Range Comment   WBC, Ur, HPF, POC 0-1      RBC, urine,  microscopic 0-1      Bacteria, U Microscopic tr      Mucus, UA neg      Epithelial cells, urine per micros 8-12      Crystals, Ur, HPF, POC neg      Casts, Ur, LPF, POC neg      Yeast, UA neg     POCT URINE PREGNANCY     Status: Normal   Collection Time   08/18/12 11:38 AM      Component Value Range Comment   Preg Test, Ur Negative     POCT URINALYSIS DIPSTICK     Status: Normal   Collection Time   08/18/12 11:47 AM      Component Value Range Comment   Color, UA yellow      Clarity, UA hazy      Glucose, UA neg      Bilirubin, UA neg      Ketones, UA neg      Spec Grav, UA 1.015      Blood, UA neg      pH, UA 6.0      Protein, UA neg      Urobilinogen, UA 0.2      Nitrite, UA neg      Leukocytes, UA Negative      No results found.  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  Assessment/Plan 1. incarcerated umbilical hernia 2. History of interstitial cystitis  Seen by Dr. Monico Blitz in the past, but has not seen him in 2+ years. 3. History of reactive airway disease 4.  Treated for GERD by Dr. Cleta Alberts  Plan: We will get the patient admitted. We will keep her n.p.o. in preparation for possible surgical intervention tonight versus surgery in the morning. We'll give her when necessary medicines for pain and nausea. I discussed this with the patient and she understands.  OSBORNE,KELLY E 08/18/2012, 3:40 PM Pager: [phone]  To discuss with Dr. Corliss Skains, who is call coverage tonight.  If not operated on tonight, I will do her surgery tomorrow.  I discussed the indications and complications of hernia surgery with the patient.  I discussed both the laparoscopic and open approach to hernia repair..  The potential risks of hernia surgery include, but are not limited to, bleeding, infection, open surgery, nerve injury, and recurrence of the hernia. Her husband and son were at the bedside.   Ovidio Kin, MD, [facility] Surgery Pager: [phone]  Office phone:   [phone]

## 2012-08-18 NOTE — ED Notes (Signed)
Dr. Ezzard Standing is currently performing a surgery on another patient in the hospital, when he is finished with his current patient, he will decide whether he will do surgery on Mrs. Funchess today or Maybe tomorrow. Dr. Ezzard Standing do want this patient to be admitted.

## 2012-08-18 NOTE — ED Notes (Signed)
Kelly with [facility] notified of patient's location in the ED.

## 2012-08-18 NOTE — ED Notes (Signed)
Pt is pending for incarcerated hernia, Tresa Endo PA from [place] surgery has seem this patient and sts this pt will be going to OR.

## 2012-08-18 NOTE — Progress Notes (Signed)
Subjective:    Patient ID: Donna Willis, female    DOB: 1961/02/03, 51 y.o.   MRN: [id number]  HPI  [name] is a 51 yr old female here with a 12 hour history of abdominal pain.  The pain started last evening and is worst around her belly button, but it feels like her "whole stomach is tight".  She describes the pain is sharp and "pulling".  She had some nausea this morning for which she took Zofran.  She has not had vomiting.  She endorses some loose stools yesterday.  Her last normal bowel movement was Saturday morning.  She denies constipation.  There has been no blood or mucus in the stool.  Appetite has been decreased, but eating does not worsen her symptoms.  The pain is somewhat positional.  She had difficulty finding a comfortable position to sleep in.  Lying flat feels best.  Bending over causes pain.  She has had some back pain today located in the right flank.  She denies problems urinating.  She has a history of interstitial cystitis, but denies any hematuria.  She denies vaginal discharge or concern for STI.  She denies the possibility of pregnancy.  LMP about 2 weeks ago.  She had a similar episode about 7 years ago.  At the time it was thought she has diverticulosis.  She was seen by GI who felt that it was likely a GI bug.  She has not yet had a screening colonoscopy.  She had a cholecystectomy and umbilical hernia repair in 2003.   Review of Systems  Constitutional: Positive for appetite change. Negative for fever and chills.  HENT: Negative.   Respiratory: Negative.   Cardiovascular: Negative.   Genitourinary: Negative.   Musculoskeletal: Positive for back pain (right flank).  Neurological: Positive for dizziness. Negative for light-headedness and headaches.       Objective:   Physical Exam  Vitals reviewed. Constitutional: She is oriented to person, place, and time. She appears well-developed and well-nourished. No distress.  Cardiovascular: Normal rate, regular rhythm  and normal heart sounds.  Exam reveals no gallop and no friction rub.   No murmur heard. Pulmonary/Chest: Breath sounds normal. She has no wheezes. She has no rales.  Abdominal: Soft. She exhibits no distension. Bowel sounds are decreased. There is no hepatosplenomegaly. There is tenderness in the periumbilical area. There is no rigidity, no rebound, no guarding, no CVA tenderness, no tenderness at McBurney's point and negative Murphy's sign. A hernia is present. Hernia confirmed positive in the ventral area.       Rosving's and obturator signs are negative  Neurological: She is alert and oriented to person, place, and time.  Skin: Skin is warm and dry.     Results for orders placed in visit on 08/18/12  POCT CBC      Component Value Range   WBC 7.4  4.6 - 10.2 K/uL   Lymph, poc 1.6  0.6 - 3.4   POC LYMPH PERCENT 21.9  10 - 50 %L   MID (cbc) 0.3  0 - 0.9   POC MID % 4.6  0 - 12 %M   POC Granulocyte 5.4  2 - 6.9   Granulocyte percent 73.5  37 - 80 %G   RBC 4.98  4.04 - 5.48 M/uL   Hemoglobin 13.2  12.2 - 16.2 g/dL   HCT, POC 40.9  81.1 - 47.9 %   MCV 87.7  80 - 97 fL   MCH, POC  26.5 (*) 27 - 31.2 pg   MCHC 30.2 (*) 31.8 - 35.4 g/dL   RDW, POC 16.1     Platelet Count, POC 299  142 - 424 K/uL   MPV 9.1  0 - 99.8 fL  POCT UA - MICROSCOPIC ONLY      Component Value Range   WBC, Ur, HPF, POC 0-1     RBC, urine, microscopic 0-1     Bacteria, U Microscopic tr     Mucus, UA neg     Epithelial cells, urine per micros 8-12     Crystals, Ur, HPF, POC neg     Casts, Ur, LPF, POC neg     Yeast, UA neg    POCT URINE PREGNANCY      Component Value Range   Preg Test, Ur Negative    POCT URINALYSIS DIPSTICK      Component Value Range   Color, UA yellow     Clarity, UA hazy     Glucose, UA neg     Bilirubin, UA neg     Ketones, UA neg     Spec Grav, UA 1.015     Blood, UA neg     pH, UA 6.0     Protein, UA neg     Urobilinogen, UA 0.2     Nitrite, UA neg     Leukocytes, UA  Negative          Assessment & Plan:  [name] is a 51 yr old female with 12 hour history of abdominal pain.  Her CBC and UA are both normal.  Urine pregnancy test is negative.  She has a known ventral hernia that she states is bulging out more than usual.  It is not reducible manually, and causes her extreme pain to attempt reduction.  Discussed the case with Dr. Alwyn Ren, who also examined the patient.  Dr. Alwyn Ren spoke with surgeon Dr. Ovidio Kin who asked that the patient proceed to directly [facility] ED for immediate consultation.  The patient is in agreement with this plan and will proceed by private car to [facility] ED.  The ED has been alerted to her arrival and will contact Dr. Ezzard Standing when she arrives.

## 2012-08-18 NOTE — Progress Notes (Signed)
Subjective patient was having abdominal pain from her umbilical hernia. The physician assistant asked me to see the patient also. She's been hurting badly since yesterday. She's had the hernia for some time, but this is significantly different. Did not do anything that made it popped out further than usual.  Objective: Umbilical hernia just below the umbilicus a larger than my fist. With gentle pressure I attempted to reduce it, but was unsuccessful. It caused a moderate amount of pain and on doing so.  Assessment: Umbilical herniation with incarceration  Plan: Spoke to the general surgeon Dr. Ovidio Kin. He denies recent the patient to [facility] long ER to be seen by the surgeon.

## 2012-08-18 NOTE — Telephone Encounter (Signed)
Spouse called looking for wife in concern to her health (he is on her medical release form) please call for his questions in regard to patient. Thank you!

## 2012-08-18 NOTE — Progress Notes (Signed)
I have called Diane at [facility] to advise her Dr Ezzard Standing needs to be paged upon patients arrival to [facility] Emergency Department.

## 2012-08-18 NOTE — Anesthesia Preprocedure Evaluation (Addendum)
Anesthesia Evaluation  Patient identified by MRN, date of birth, ID band Patient awake    Reviewed: Allergy & Precautions, H&P , NPO status , Patient's Chart, lab work & pertinent test results  Airway Mallampati: II TM Distance: >3 FB Neck ROM: Full    Dental  (+) Teeth Intact and Caps,    Pulmonary neg pulmonary ROS,  breath sounds clear to auscultation  Pulmonary exam normal       Cardiovascular - CAD, - Past MI and - CHF Rhythm:Regular Rate:Normal     Neuro/Psych negative neurological ROS  negative psych ROS   GI/Hepatic Neg liver ROS, GERD-  Medicated,  Endo/Other  negative endocrine ROSMorbid obesity  Renal/GU negative Renal ROS     Musculoskeletal negative musculoskeletal ROS (+)   Abdominal (+) + obese,   Peds  Hematology negative hematology ROS (+)   Anesthesia Other Findings   Reproductive/Obstetrics                          Anesthesia Physical Anesthesia Plan  ASA: II  Anesthesia Plan: General   Post-op Pain Management:    Induction: Intravenous and Rapid sequence  Airway Management Planned: Oral ETT  Additional Equipment:   Intra-op Plan:   Post-operative Plan: Extubation in OR  Informed Consent: I have reviewed the patients History and Physical, chart, labs and discussed the procedure including the risks, benefits and alternatives for the proposed anesthesia with the patient or authorized representative who has indicated his/her understanding and acceptance.   Dental advisory given  Plan Discussed with: CRNA  Anesthesia Plan Comments:         Anesthesia Quick Evaluation

## 2012-08-18 NOTE — ED Notes (Signed)
Patient  States that she began having mid abdominal pain last night. Patient went to Urgent Care and they in turn call [facility]. Dr. Ezzard Standing is to see patient in the ED. Patient also c/o nausea, but denies diarrhea, vomiting , or fever.

## 2012-08-18 NOTE — Progress Notes (Signed)
Screen opened on error

## 2012-08-18 NOTE — Patient Instructions (Addendum)
We have spoken to Dr Ezzard Standing at [facility], he is going to meet you in the ER< I have spoken to Diane at [facility] ED and she will page Dr Ezzard Standing upon your arrival to [facility] Emergency

## 2012-08-19 ENCOUNTER — Inpatient Hospital Stay (HOSPITAL_COMMUNITY): Payer: 59 | Admitting: Anesthesiology

## 2012-08-19 ENCOUNTER — Encounter (HOSPITAL_COMMUNITY): Payer: Self-pay | Admitting: Anesthesiology

## 2012-08-19 ENCOUNTER — Encounter (HOSPITAL_COMMUNITY): Admission: EM | Disposition: A | Discharge status: Home / Self Care | Payer: Self-pay | Source: Home / Self Care

## 2012-08-19 HISTORY — PX: UMBILICAL HERNIA REPAIR: SHX196

## 2012-08-19 LAB — BASIC METABOLIC PANEL
Calcium: 8.6 mg/dL (ref 8.4–10.5)
Chloride: 104 mEq/L (ref 96–112)
Creatinine, Ser: 0.66 mg/dL (ref 0.50–1.10)
GFR calc Af Amer: >90 mL/min (ref >90)
GFR calc non Af Amer: >90 mL/min (ref >90)

## 2012-08-19 LAB — CBC
HCT: 35.8 % — ABNORMAL LOW (ref 36.0–46.0)
Platelets: 215 10*3/uL (ref 150–400)
RDW: 14.1 % (ref 11.5–15.5)
WBC: 6.6 10*3/uL (ref 4.0–10.5)

## 2012-08-19 SURGERY — REPAIR, HERNIA, UMBILICAL, LAPAROSCOPIC
Anesthesia: General | Site: Abdomen | Wound class: Clean

## 2012-08-19 MED ORDER — FENTANYL CITRATE 0.05 MG/ML IJ SOLN
INTRAMUSCULAR | Status: DC | PRN
  Administered 2012-08-19 (×2): 50 ug via INTRAVENOUS
  Administered 2012-08-19: 100 ug via INTRAVENOUS
  Administered 2012-08-19: 50 ug via INTRAVENOUS

## 2012-08-19 MED ORDER — ENOXAPARIN SODIUM 40 MG/0.4ML ~~LOC~~ SOLN
40.0000 mg | SUBCUTANEOUS | Status: DC
  Administered 2012-08-19 – 2012-08-20 (×2): 40 mg via SUBCUTANEOUS
  Filled 2012-08-19 (×3): qty 0.4

## 2012-08-19 MED ORDER — GLYCOPYRROLATE 0.2 MG/ML IJ SOLN
INTRAMUSCULAR | Status: DC | PRN
  Administered 2012-08-19: 0.6 mg via INTRAVENOUS

## 2012-08-19 MED ORDER — LABETALOL HCL 5 MG/ML IV SOLN
INTRAVENOUS | Status: DC | PRN
  Administered 2012-08-19: 5 mg via INTRAVENOUS

## 2012-08-19 MED ORDER — HYDROCODONE-ACETAMINOPHEN 5-325 MG PO TABS
1.0000 | ORAL_TABLET | ORAL | Status: DC | PRN
  Administered 2012-08-19 – 2012-08-21 (×7): 2 via ORAL
  Filled 2012-08-19 (×7): qty 2

## 2012-08-19 MED ORDER — NEOSTIGMINE METHYLSULFATE 1 MG/ML IJ SOLN
INTRAMUSCULAR | Status: DC | PRN
  Administered 2012-08-19: 5 mg via INTRAVENOUS

## 2012-08-19 MED ORDER — MORPHINE SULFATE 2 MG/ML IJ SOLN
1.0000 mg | INTRAMUSCULAR | Status: DC | PRN
  Administered 2012-08-19 (×3): 2 mg via INTRAVENOUS
  Filled 2012-08-19 (×3): qty 1

## 2012-08-19 MED ORDER — BUPIVACAINE HCL (PF) 0.25 % IJ SOLN
INTRAMUSCULAR | Status: DC | PRN
  Administered 2012-08-19: 20 mL

## 2012-08-19 MED ORDER — ONDANSETRON HCL 4 MG PO TABS
4.0000 mg | ORAL_TABLET | Freq: Four times a day (QID) | ORAL | Status: DC | PRN
  Administered 2012-08-20: 4 mg via ORAL
  Filled 2012-08-19: qty 1

## 2012-08-19 MED ORDER — ACETAMINOPHEN 10 MG/ML IV SOLN
INTRAVENOUS | Status: DC | PRN
  Administered 2012-08-19: 1000 mg via INTRAVENOUS

## 2012-08-19 MED ORDER — LACTATED RINGERS IV SOLN
INTRAVENOUS | Status: DC
  Administered 2012-08-19: 1000 mL via INTRAVENOUS

## 2012-08-19 MED ORDER — CEFAZOLIN SODIUM-DEXTROSE 2-3 GM-% IV SOLR
INTRAVENOUS | Status: DC | PRN
  Administered 2012-08-19: 2 g via INTRAVENOUS

## 2012-08-19 MED ORDER — ROCURONIUM BROMIDE 100 MG/10ML IV SOLN
INTRAVENOUS | Status: DC | PRN
  Administered 2012-08-19: 35 mg via INTRAVENOUS

## 2012-08-19 MED ORDER — KCL IN DEXTROSE-NACL 20-5-0.45 MEQ/L-%-% IV SOLN
INTRAVENOUS | Status: DC
  Administered 2012-08-19 – 2012-08-21 (×4): via INTRAVENOUS
  Filled 2012-08-19 (×6): qty 1000

## 2012-08-19 MED ORDER — ONDANSETRON HCL 4 MG/2ML IJ SOLN
INTRAMUSCULAR | Status: DC | PRN
  Administered 2012-08-19: 4 mg via INTRAVENOUS

## 2012-08-19 MED ORDER — IBUPROFEN 600 MG PO TABS
600.0000 mg | ORAL_TABLET | Freq: Four times a day (QID) | ORAL | Status: DC | PRN
  Administered 2012-08-21 (×2): 600 mg via ORAL
  Filled 2012-08-19 (×2): qty 1

## 2012-08-19 MED ORDER — PROPOFOL 10 MG/ML IV BOLUS
INTRAVENOUS | Status: DC | PRN
  Administered 2012-08-19: 150 mg via INTRAVENOUS

## 2012-08-19 MED ORDER — LACTATED RINGERS IV SOLN: INTRAVENOUS | Status: DC

## 2012-08-19 MED ORDER — HYDRALAZINE HCL 20 MG/ML IJ SOLN
INTRAMUSCULAR | Status: DC | PRN
  Administered 2012-08-19: 5 mg via INTRAVENOUS

## 2012-08-19 MED ORDER — ONDANSETRON HCL 4 MG/2ML IJ SOLN
4.0000 mg | Freq: Four times a day (QID) | INTRAMUSCULAR | Status: DC | PRN
  Administered 2012-08-19 – 2012-08-21 (×4): 4 mg via INTRAVENOUS
  Filled 2012-08-19 (×5): qty 2

## 2012-08-19 MED ORDER — HYDROMORPHONE HCL PF 1 MG/ML IJ SOLN
INTRAMUSCULAR | Status: DC | PRN
  Administered 2012-08-19 (×2): 1 mg via INTRAVENOUS

## 2012-08-19 MED ORDER — HYDROMORPHONE HCL PF 1 MG/ML IJ SOLN
0.2500 mg | INTRAMUSCULAR | Status: DC | PRN
  Administered 2012-08-19 (×2): 0.5 mg via INTRAVENOUS

## 2012-08-19 MED ORDER — LACTATED RINGERS IV SOLN
INTRAVENOUS | Status: DC | PRN
  Administered 2012-08-19: 11:00:00 via INTRAVENOUS

## 2012-08-19 MED ORDER — BUPIVACAINE LIPOSOME 1.3 % IJ SUSP
20.0000 mL | Freq: Once | INTRAMUSCULAR | Status: DC
  Filled 2012-08-19: qty 20

## 2012-08-19 MED ORDER — MIDAZOLAM HCL 5 MG/5ML IJ SOLN
INTRAMUSCULAR | Status: DC | PRN
  Administered 2012-08-19: 2 mg via INTRAVENOUS

## 2012-08-19 MED ORDER — LIDOCAINE HCL (CARDIAC) 20 MG/ML IV SOLN
INTRAVENOUS | Status: DC | PRN
  Administered 2012-08-19: 100 mg via INTRAVENOUS

## 2012-08-19 MED ORDER — DEXAMETHASONE SODIUM PHOSPHATE 10 MG/ML IJ SOLN
INTRAMUSCULAR | Status: DC | PRN
  Administered 2012-08-19: 10 mg via INTRAVENOUS

## 2012-08-19 MED ORDER — SUCCINYLCHOLINE CHLORIDE 20 MG/ML IJ SOLN
INTRAMUSCULAR | Status: DC | PRN
  Administered 2012-08-19: 100 mg via INTRAVENOUS

## 2012-08-19 MED ORDER — PROMETHAZINE HCL 25 MG/ML IJ SOLN: 6.2500 mg | INTRAMUSCULAR | Status: DC | PRN

## 2012-08-19 SURGICAL SUPPLY — 47 items
APL SKNCLS STERI-STRIP NONHPOA (GAUZE/BANDAGES/DRESSINGS) ×1
APPLIER CLIP ROT 10 11.4 M/L (STAPLE)
BENZOIN TINCTURE PRP APPL 2/3 (GAUZE/BANDAGES/DRESSINGS) ×2 IMPLANT
CABLE HIGH FREQUENCY MONO STRZ (ELECTRODE) ×2 IMPLANT
CANISTER SUCTION 2500CC (MISCELLANEOUS) IMPLANT
CLIP APPLIE ROT 10 11.4 M/L (STAPLE) IMPLANT
CLOTH BEACON ORANGE TIMEOUT ST (SAFETY) ×2 IMPLANT
DECANTER SPIKE VIAL GLASS SM (MISCELLANEOUS) ×2 IMPLANT
DERMABOND ADVANCED (GAUZE/BANDAGES/DRESSINGS) ×1
DERMABOND ADVANCED .7 DNX12 (GAUZE/BANDAGES/DRESSINGS) ×1 IMPLANT
DEVICE SECURE STRAP 25 ABSORB (INSTRUMENTS) ×4 IMPLANT
DEVICE TROCAR PUNCTURE CLOSURE (ENDOMECHANICALS) ×2 IMPLANT
DRAPE INCISE IOBAN 66X45 STRL (DRAPES) ×2 IMPLANT
DRAPE LAPAROSCOPIC ABDOMINAL (DRAPES) ×2 IMPLANT
ELECT REM PT RETURN 9FT ADLT (ELECTROSURGICAL) ×2
ELECTRODE REM PT RTRN 9FT ADLT (ELECTROSURGICAL) ×1 IMPLANT
GLOVE BIO SURGEON STRL SZ 6.5 (GLOVE) ×4 IMPLANT
GLOVE BIOGEL PI IND STRL 7.0 (GLOVE) ×2 IMPLANT
GLOVE BIOGEL PI IND STRL 7.5 (GLOVE) ×1 IMPLANT
GLOVE BIOGEL PI INDICATOR 7.0 (GLOVE) ×2
GLOVE BIOGEL PI INDICATOR 7.5 (GLOVE) ×1
GLOVE ECLIPSE 7.0 STRL STRAW (GLOVE) ×2 IMPLANT
GLOVE SURG SIGNA 7.5 PF LTX (GLOVE) ×2 IMPLANT
GLOVE SURG SS PI 7.5 STRL IVOR (GLOVE) ×2 IMPLANT
GOWN STRL NON-REIN LRG LVL3 (GOWN DISPOSABLE) ×2 IMPLANT
GOWN STRL REIN XL XLG (GOWN DISPOSABLE) ×4 IMPLANT
KIT BASIN OR (CUSTOM PROCEDURE TRAY) ×4 IMPLANT
MESH PARIETEX 4.7 (Mesh General) ×2 IMPLANT
NEEDLE SPNL 22GX3.5 QUINCKE BK (NEEDLE) ×2 IMPLANT
PENCIL BUTTON HOLSTER BLD 10FT (ELECTRODE) IMPLANT
SCALPEL HARMONIC ACE (MISCELLANEOUS) IMPLANT
SET IRRIG TUBING LAPAROSCOPIC (IRRIGATION / IRRIGATOR) ×2 IMPLANT
SOLUTION ANTI FOG 6CC (MISCELLANEOUS) ×2 IMPLANT
STAPLER VISISTAT 35W (STAPLE) ×2 IMPLANT
STRIP CLOSURE SKIN 1/4X3 (GAUZE/BANDAGES/DRESSINGS) ×2 IMPLANT
STRIP CLOSURE SKIN 1/4X4 (GAUZE/BANDAGES/DRESSINGS) ×2 IMPLANT
SUT MON AB 5-0 PS2 18 (SUTURE) ×2 IMPLANT
SUT NOVA NAB GS-21 0 18 T12 DT (SUTURE) ×2 IMPLANT
SUT NOVA NAB GS-21 1 T12 (SUTURE) ×2 IMPLANT
TOWEL OR 17X26 10 PK STRL BLUE (TOWEL DISPOSABLE) ×2 IMPLANT
TRAY FOLEY CATH 14FRSI W/METER (CATHETERS) ×2 IMPLANT
TRAY LAP CHOLE (CUSTOM PROCEDURE TRAY) ×2 IMPLANT
TROCAR HASSON GELL 12X100 (TROCAR) IMPLANT
TROCAR Z-THREAD FIOS 11X100 BL (TROCAR) ×2 IMPLANT
TROCAR Z-THREAD FIOS 5X100MM (TROCAR) ×2 IMPLANT
TUBING INSUFFLATION 10FT LAP (TUBING) ×2 IMPLANT
WATER STERILE IRR 1500ML POUR (IV SOLUTION) ×2 IMPLANT

## 2012-08-19 NOTE — Transfer of Care (Signed)
Immediate Anesthesia Transfer of Care Note  Patient: Donna Willis  Procedure(s) Performed: Procedure(s) (LRB): LAPAROSCOPIC UMBILICAL HERNIA (N/A)  Patient Location: PACU  Anesthesia Type: General  Level of Consciousness: sedated, patient cooperative and responds to stimulaton  Airway & Oxygen Therapy: Patient Spontanous Breathing and Patient connected to face mask oxgen  Post-op Assessment: Report given to PACU RN and Post -op Vital signs reviewed and stable  Post vital signs: Reviewed and stable  Complications: No apparent anesthesia complications

## 2012-08-19 NOTE — Progress Notes (Signed)
Patient ID: Donna Willis, female   DOB: 1961-07-10, 51 y.o.   MRN: [id number] Patient feels better this morning.  Less pain.  Hernia reduced on its own last night, partially.  Patient is ready for OR today.  Has no questions.  Donna Willis E 9:52 AM

## 2012-08-19 NOTE — Anesthesia Postprocedure Evaluation (Signed)
Anesthesia Post Note  Patient: Donna Willis  Procedure(s) Performed: Procedure(s) (LRB): LAPAROSCOPIC UMBILICAL HERNIA (N/A)  Anesthesia type: General  Patient location: PACU  Post pain: Pain level controlled  Post assessment: Post-op Vital signs reviewed  Last Vitals:  Filed Vitals:   08/19/12 1330  BP: 117/69  Pulse: 68  Temp:   Resp: 7    Post vital signs: Reviewed  Level of consciousness: sedated  Complications: No apparent anesthesia complications

## 2012-08-19 NOTE — Brief Op Note (Signed)
08/18/2012 - 08/19/2012  12:38 PM  PATIENT:  Donna Willis, 51 y.o., female, MRN: [id number]  PREOP DIAGNOSIS:  incarcerated umbilical hernia  POSTOP DIAGNOSIS:   Incarcerated (omentum) recurrent umbilical/ventral hernia.  PROCEDURE:   Procedure(s): LAPAROSCOPIC UMBILICAL HERNIA  SURGEON:   Ovidio Kin, M.D.  ASSISTANT:   None  ANESTHESIA:   general  Einar Pheasant, MD - Anesthesiologist Mechele Dawley, CRNA - CRNA Edison Pace - CRNA  General  EBL:  min  ml  BLOOD ADMINISTERED: none  DRAINS: none   LOCAL MEDICATIONS USED:   20 cc 1/4  SPECIMEN:   none  COUNTS CORRECT:  YES  INDICATIONS FOR PROCEDURE:  Donna Willis is a 51 y.o. (DOB: 05/15/1961) white female whose primary care physician is No primary provider on file. and comes for incarcerated recurrent umbilical/ventral hernia.   The indications and risks of the surgery were explained to the patient.  The risks include, but are not limited to, infection, bleeding, and nerve injury.  Note dictated to:   #[id number]  Type of repair - Mesh  (choices - primary suture, mesh, or component)  Name of mesh - Parietex composite  Size of mesh - Length 3 cm, Width 5 cm  Mesh overlap - 4 cm  Placement of mesh - beneath fascia and into peritoneal cavity,   (choices - beneath fascia and into peritoneal cavity, beneath fascia but external to peritoneal cavity, between the muscle and fascia, above or external to fascia)

## 2012-08-19 NOTE — Preoperative (Signed)
Beta Blockers   Reason not to administer Beta Blockers:Not Applicable 

## 2012-08-20 ENCOUNTER — Encounter (HOSPITAL_COMMUNITY): Payer: Self-pay | Admitting: Surgery

## 2012-08-20 NOTE — Op Note (Signed)
NAMEDOROTHIE, WAH NO.:  [id number]  MEDICAL RECORD NO.:  [id number]  LOCATION:  1527                         FACILITY:  [facility]  PHYSICIAN:  Sandria Bales. Ezzard Standing, M.D.  DATE OF BIRTH:  03/13/1961  DATE OF PROCEDURE:  19 August 2012                              OPERATIVE REPORT   PREOPERATIVE DIAGNOSIS:  Incarcerated umbilical/ventral hernia.  POSTOPERATIVE DIAGNOSIS:  Incarcerated umbilical/ventral hernia approximately 3 x 5-cm defect.  [Photos taken]  PROCEDURE:  Laparoscopic umbilical/ventral hernia repair with 12 x 12-cm Parietex composite mesh.  SURGEON:  Sandria Bales. Ezzard Standing, MD  FIRST ASSISTANT:  None.  ANESTHESIA:  General supervised Dr. Lucille Passy.  ESTIMATED BLOOD LOSS:  Minimal.  I used about 20 mL of 0.25% Marcaine. There was no specimen.  INDICATION FOR PROCEDURE:  Ms. Topor is a 51 year old white female who was seen through [facility] of her primary medical doctor, sees Dr. Varney Baas from a GYN standpoint provisionally with an incarcerated umbilical/ventral hernia.  She had had an umbilical hernia repair by Dr. Johna Sheriff in 2002, during a laparoscopic cholecystectomy.  She noticed a bulge her umbilicus, but then presented acutely yesterday with increasing abdominal pain.  She was seen by Dr. Janace Hoard and sent to the [facility] Emergency Room where we admitted her.  I discussed with her the indications, potential complications of umbilical hernia, surgery, potential complications include, but are not limited to, bleeding, infection, nerve injury, and recurrent hernia.  I also discussed the open and laparoscopic repair, but felt it should be best served with a laparoscopic hernia repair.  OPERATIVE NOTE:  The patient was placed in supine position in room #6, both arms were tucked.  Her abdomen was prepped with ChloraPrep, sterilely draped.  She had general anesthesia supervised by Dr. Lucille Passy, who was given 2 g of Ancef at  the initiation of procedure.  The time-out was held and the surgical checklist run.  Her abdomen was prepped with ChloraPrep and sterilely draped with Ioban drape.  I accessed her abdominal cavity through a left upper quadrant 10- mm Optiview trocar and insufflated the abdomen, and placed a second 5-mm trocar in the left mid abdomen.  Her right and left lobes of liver were unremarkable.  She does have a lot of omentum, portion of this which was incarcerated in this umbilical/ventral hernia, and this hernia was recurred and had been repaired by Dr. Johna Sheriff in the past.  I was able to reduce this omentum out.  There was some bleeding from the omentum which I controlled with Bovie electrocautery.   The defect that was about 3 cm cranially and 5 cm transversely.  I then closed the hernia primarily with a figure-of-eight #1 Novafil suture.  Then, I closed the hernia primarily.  I placed an Underlay composite Parietex mesh.  I used four holding sutures for the Parietex mass along with 35 Secruretrap absorbable tacks for the mesh.  The mesh lay flat.  I desufflated the abdomen one time, in which, there was no evidence of any defect over the edge of the mesh.  I reinspected the omentum, take it out.  I then irrigated out with about  500 mL of saline.  There was no bleeding or other injury.  The patient tolerated the procedure well.  The trocars were then removed in turn.  I infiltrated about 20 mL of 0.25% Marcaine in the different trocar sites.  I closed the two left abdominal trocar sites with a 5-0 Vicryl suture and painted with Dermabond.  The puncture wounds for the transfascial sutures, which were five, puncture wounds were closed with Steri-Strips.    The patient tolerated the procedure well, was transported to the recovery room in good condition.  Sponge and needle count were correct at the end of the case.   Sandria Bales. Ezzard Standing, M.D., FACS   DHN/MEDQ  D:  08/19/2012  T:  08/20/2012   Job:  [id number]  cc:   [facility]  Freddy Finner, M.D. Fax: [phone]

## 2012-08-20 NOTE — Care Management Note (Signed)
    Page 1 of 1   08/20/2012     2:07:03 PM   CARE MANAGEMENT NOTE 08/20/2012  Patient:  Donna Willis, Donna Willis   Account Number:  [id number]  Date Initiated:  08/20/2012  Documentation initiated by:  Lorenda Ishihara  Subjective/Objective Assessment:   51 yo female admitted s/p ventral hernia repair. PTA lived at home with spouse.     Action/Plan:   Anticipated DC Date:  08/21/2012   Anticipated DC Plan:  HOME/SELF CARE      DC Planning Services  CM consult      Choice offered to / List presented to:             Status of service:  Completed, signed off Medicare Important Message given?   (If response is "NO", the following Medicare IM given date fields will be blank) Date Medicare IM given:   Date Additional Medicare IM given:    Discharge Disposition:  HOME/SELF CARE  Per UR Regulation:  Reviewed for med. necessity/level of care/duration of stay  If discussed at Long Length of Stay Meetings, dates discussed:    Comments:

## 2012-08-20 NOTE — Progress Notes (Signed)
EVERY SINCE LAST NIGHT EVERY TIME  PT HAS VOIDED HER URINE HAS BEEN BLOOD TINGED.  SHE IS VOIDING FREQUENTLY.   NOTIFIED MD ON CALL. ORDERED TO MONITOR PT'S URINE.  WILL CONTINUE TO DO SO.

## 2012-08-20 NOTE — Progress Notes (Signed)
Patient ID: Donna Willis, female   DOB: [birth year], 51 y.o.   MRN: [id number] 1 Day Post-Op  Subjective: Pt with little appetite.  No flatus yet, but tolerated the clear liquids she had this morning.  Objective: Vital signs in last 24 hours: Temp:  [97.9 F (36.6 C)-98.9 F (37.2 C)] 98.4 F (36.9 C) (10/09 1113) Pulse Rate:  [65-89] 72  (10/09 1113) Resp:  [7-18] 18  (10/09 1113) BP: (103-149)/(58-84) 117/72 mmHg (10/09 1113) SpO2:  [90 %-100 %] 95 % (10/09 1113) Last BM Date: 08/17/12  Intake/Output from previous day: 10/08 0701 - 10/09 0700 In: 2510 [I.V.:2510] Out: 2700 [Urine:2700] Intake/Output this shift: Total I/O In: 302.5 [I.V.:302.5] Out: 300 [Urine:300]  PE: Abd: soft, tender appropriately, few BS, incisions c/d/i  Lab Results:   Basename 08/19/12 0350 08/18/12 1138  WBC 6.6 7.4  HGB 11.9* 13.2  HCT 35.8* 43.7  PLT 215 --   BMET  Basename 08/19/12 0350 08/18/12 1609  NA 137 138  K 3.9 3.7  CL 104 103  CO2 24 23  GLUCOSE 117* 100*  BUN 7 6  CREATININE 0.66 0.61  CALCIUM 8.6 9.1   PT/INR No results found for this basename: LABPROT:2,INR:2 in the last 72 hours CMP     Component Value Date/Time   NA 137 08/19/2012 0350   K 3.9 08/19/2012 0350   CL 104 08/19/2012 0350   CO2 24 08/19/2012 0350   GLUCOSE 117* 08/19/2012 0350   BUN 7 08/19/2012 0350   CREATININE 0.66 08/19/2012 0350   CALCIUM 8.6 08/19/2012 0350   GFRNONAA >90 08/19/2012 0350   GFRAA >90 08/19/2012 0350   Lipase  No results found for this basename: lipase       Studies/Results: No results found.  Anti-infectives: Anti-infectives     Start     Dose/Rate Route Frequency Ordered Stop   08/18/12 1600   cefOXitin (MEFOXIN) 2 g in dextrose 5 % 50 mL IVPB     Comments: On call to OR      2 g 100 mL/hr over 30 Minutes Intravenous On call to O.R. 08/18/12 1554 08/19/12 0559         Assessment/Plan  1. S/p lap umbilical hernia repair - D. Aceyn Kathol - 08/19/2012  Plan: 1. Advance  diet as tolerates 2. Mobilize 3. Suspect home tomorrow   LOS: 2 days   OSBORNE,KELLY E 08/20/2012, 11:39 AM Pager: [phone]  Doing well except for soreness.  Ovidio Kin, MD, [facility] Pager: [phone] Office phone:  [phone]

## 2012-08-21 MED ORDER — PANTOPRAZOLE SODIUM 40 MG PO TBEC: 40.0000 mg | DELAYED_RELEASE_TABLET | Freq: Every day | ORAL | Status: DC

## 2012-08-21 MED ORDER — HYDROCODONE-ACETAMINOPHEN 5-325 MG PO TABS: 1.0000 | ORAL_TABLET | ORAL | Status: DC | PRN

## 2012-08-21 NOTE — Progress Notes (Signed)
  Pharmacy Note: IV --> PO Conversion  This patient is receiving IV Protonix. Based on criteria approved by the Pharmacy and Therapeutics Committee, this medication is being converted to the equivalent oral dose form. These criteria include:   . The patient is eating (either orally or per tube) and/or has been taking other orally administered medications for at least 24 hours.  . This patient has no evidence of active gastrointestinal bleeding or impaired GI absorption (gastrectomy, short bowel, patient on TNA or NPO).   If you have questions about this conversion, please contact the pharmacy department. [phone]  Darrol Angel, PharmD Pager: [phone] 08/21/2012 9:38 AM

## 2012-08-21 NOTE — Progress Notes (Signed)
Patient evaluated for long-term disease management services with [facility] Care Management Program as benefit of her [facility] insurance. She works within the [employer]. Spoke with patient at bedside regarding Link to Wellness program. Patient will receive a post discharge transition of care call upon discharge.  Raiford Noble, MSN- Ed, RN,BSN [facility] Liaison [phone]

## 2012-08-21 NOTE — Discharge Summary (Signed)
  Patient ID: KARENE BRACKEN MRN: [id number] DOB/AGE: [birth year] 51 y.o.  Admit date: 08/18/2012 Discharge date: 08/21/2012  Procedures: laparoscopic umbilical hernia repair with mesh  Consults: None  Reason for Admission: This is a 51 yo female who presented to [facility] with abdominal pain secondary to an incarcerated umbilical hernia.  She was admitted.  Admission Diagnoses:  Patient Active Problem List  Diagnosis  . Incarcerated umbilical hernia    Hospital Course: The patient was admitted and taken to the operating room the following day.  She had a large amount of omentum stuck in her hernia.  This was repair with mesh.  The patient tolerated the procedure well.  She was started on clear liquids on POD#1 and was able to be advanced as tolerated.  She had a BM and passed flatus on POD# 2.  She was tolerating a regular diet at this time and was felt stable for dc home.  Discharge Diagnoses:  Principal Problem:  *Incarcerated umbilical hernia s/p lap UHR  Discharge Medications:   Medication List     As of 08/21/2012 12:00 PM    TAKE these medications         HYDROcodone-acetaminophen 5-325 MG per tablet   Commonly known as: NORCO/VICODIN   Take 1-2 tablets by mouth every 4 (four) hours as needed.      ibuprofen 200 MG tablet   Commonly known as: ADVIL,MOTRIN   Take 400 mg by mouth every 6 (six) hours as needed. pain      omeprazole 20 MG capsule   Commonly known as: PRILOSEC   Take 20 mg by mouth daily.        Discharge Instructions:     Follow-up Information    Follow up with Uyen Eichholz H, MD. In 2 weeks.   Contact information:   [address] [place] [address] [phone]          Signed: Letha Cape 08/21/2012, 12:00 PM  Agree with above.  Ovidio Kin, M.D. [facility] [phone]

## 2012-08-21 NOTE — Progress Notes (Addendum)
Patient ID: Donna Willis, female   DOB: 1961/09/22, 51 y.o.   MRN: [id number] 2 Days Post-Op  Subjective: Pt states she's feeling ok.  Not eating a lot, but just had a BM and is passing flatus.  Objective: Vital signs in last 24 hours: Temp:  [98.2 F (36.8 C)-98.6 F (37 C)] 98.5 F (36.9 C) (10/10 0620) Pulse Rate:  [73-89] 88  (10/10 0620) Resp:  [18-20] 18  (10/10 0620) BP: (107-136)/(48-93) 126/93 mmHg (10/10 0620) SpO2:  [95 %-99 %] 95 % (10/10 0620) Last BM Date: 08/19/12  Intake/Output from previous day: 10/09 0701 - 10/10 0700 In: 2340 [P.O.:480; I.V.:1860] Out: 3300 [Urine:3300] Intake/Output this shift: Total I/O In: 120 [P.O.:120] Out: 200 [Urine:200]  PE: Abd: soft, tender appropriately, incisions c/d/i, abdominal binder in place  Lab Results:   Basename 08/19/12 0350  WBC 6.6  HGB 11.9*  HCT 35.8*  PLT 215   BMET  Basename 08/19/12 0350 08/18/12 1609  NA 137 138  K 3.9 3.7  CL 104 103  CO2 24 23  GLUCOSE 117* 100*  BUN 7 6  CREATININE 0.66 0.61  CALCIUM 8.6 9.1   PT/INR No results found for this basename: LABPROT:2,INR:2 in the last 72 hours CMP     Component Value Date/Time   NA 137 08/19/2012 0350   K 3.9 08/19/2012 0350   CL 104 08/19/2012 0350   CO2 24 08/19/2012 0350   GLUCOSE 117* 08/19/2012 0350   BUN 7 08/19/2012 0350   CREATININE 0.66 08/19/2012 0350   CALCIUM 8.6 08/19/2012 0350   GFRNONAA >90 08/19/2012 0350   GFRAA >90 08/19/2012 0350   Lipase  No results found for this basename: lipase    Studies/Results: No results found.  Anti-infectives: Anti-infectives     Start     Dose/Rate Route Frequency Ordered Stop   08/18/12 1600   cefOXitin (MEFOXIN) 2 g in dextrose 5 % 50 mL IVPB     Comments: On call to OR      2 g 100 mL/hr over 30 Minutes Intravenous On call to O.R. 08/18/12 1554 08/19/12 0559          Assessment/Plan  1. Laparoscopic umbilical hernia repair - incarcerated umbilical hernia  D. Lizzie Cokley -  08/19/2012  Plan: 1. Patient is going to eat a solid diet for lunch, if she tolerates this she may dc home   LOS: 3 days    OSBORNE,KELLY E 08/21/2012, 11:56 AM Pager: [phone]  Overall looks good.  Agree with above.  Ovidio Kin, MD, [facility] Surgery Pager: [phone] Office phone:  [phone]

## 2012-08-29 ENCOUNTER — Telehealth (INDEPENDENT_AMBULATORY_CARE_PROVIDER_SITE_OTHER): Payer: Self-pay

## 2012-08-29 ENCOUNTER — Encounter (INDEPENDENT_AMBULATORY_CARE_PROVIDER_SITE_OTHER): Payer: Self-pay

## 2012-08-29 NOTE — Telephone Encounter (Signed)
The pt called requesting a note for work to return Monday.  She was told she can go back to work before her office visit.  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  She does no heavy lifting.  She wants a note faxed to Wyman Songster at [phone].  I typed a note and faxed it.

## 2012-09-04 ENCOUNTER — Encounter (INDEPENDENT_AMBULATORY_CARE_PROVIDER_SITE_OTHER): Payer: Self-pay | Admitting: Surgery

## 2012-09-04 ENCOUNTER — Encounter: Payer: Self-pay | Admitting: Family Medicine

## 2012-09-04 ENCOUNTER — Encounter: Payer: Self-pay | Admitting: Internal Medicine

## 2012-09-04 ENCOUNTER — Ambulatory Visit (INDEPENDENT_AMBULATORY_CARE_PROVIDER_SITE_OTHER): Payer: Commercial Managed Care - PPO | Admitting: Surgery

## 2012-09-04 ENCOUNTER — Ambulatory Visit (INDEPENDENT_AMBULATORY_CARE_PROVIDER_SITE_OTHER): Payer: 59 | Admitting: Family Medicine

## 2012-09-04 VITALS — BP 137/92 | HR 78 | Temp 97.4°F | Ht 63.75 in | Wt 226.0 lb

## 2012-09-04 VITALS — BP 124/76 | HR 96 | Temp 97.6°F | Resp 18 | Ht 64.0 in | Wt 227.2 lb

## 2012-09-04 DIAGNOSIS — R079 Chest pain, unspecified: Secondary | ICD-10-CM

## 2012-09-04 DIAGNOSIS — K420 Umbilical hernia with obstruction, without gangrene: Secondary | ICD-10-CM

## 2012-09-04 DIAGNOSIS — R0789 Other chest pain: Secondary | ICD-10-CM

## 2012-09-04 DIAGNOSIS — Z1211 Encounter for screening for malignant neoplasm of colon: Secondary | ICD-10-CM

## 2012-09-04 NOTE — Progress Notes (Signed)
Office Note 09/04/2012  CC:  Chief Complaint  Patient presents with  . Establish Care    twinge left side of chest    HPI:  Donna Willis is a 51 y.o. White female who is here to establish care. Patient's most recent primary MD: Urgent care on [address].  GYN is Dr[facility] at [facility] [place]. Old records in EPIC/HL were reviewed prior to or during today's visit.  She is almost fully recovered from her recent umbillical hernia repair. She complains of recent "twinges" of pain in left mid/upper chest region, both at rest and when she walks, mild intensity, lasts minutes usually and spontaneously resolves.  No SOB, nausea, palpitations, or jaw or arm symptoms.  Says she occasionally has some left arm achiness but SEPARATE from the recent chest twinges.  Says bp has been normal at work, past history of HTN, says she knows she had to get some medicine in surgery for high bp twice.    Past Medical History  Diagnosis Date  . GERD (gastroesophageal reflux disease)   . Hernia   . Interstitial cystitis     Dr. Earlene Plater: hasn't seen him in yrs or so, was on elmiron and coldn't tell much difference.  Cherene Altes fever     seasonal    Past Surgical History  Procedure Date  . Cholecystectomy 2003  . Cesarean section 1988  . Tonsillectomy and adenoidectomy 1974  . Hernia repair 2003    umbilical, during lap chole  . Umbilical hernia repair 08/19/2012    Procedure: LAPAROSCOPIC UMBILICAL HERNIA;  Surgeon: Kandis Cocking, MD;  Location: [facility]S;  Service: General;  Laterality: N/A;    Family History  Problem Relation Age of Onset  . Colon polyps Mother   . Diverticulitis Father     History   Social History  . Marital Status: Married    Spouse Name: N/A    Number of Children: N/A  . Years of Education: N/A   Occupational History  . Not on file.   Social History Main Topics  . Smoking status: Never Smoker   . Smokeless tobacco: Never Used  . Alcohol Use: Yes     rare  .  Drug Use: No  . Sexually Active: No   Other Topics Concern  . 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  No T/A/Ds    Outpatient Encounter Prescriptions as of 09/04/2012  Medication Sig Dispense Refill  . HYDROcodone-acetaminophen (NORCO/VICODIN) 5-325 MG per tablet Take 1-2 tablets by mouth every 4 (four) hours as needed.  40 tablet  0  . ibuprofen (ADVIL,MOTRIN) 200 MG tablet Take 400 mg by mouth every 6 (six) hours as needed. pain      . omeprazole (PRILOSEC) 20 MG capsule Take 20 mg by mouth daily.        Allergies  Allergen Reactions  . Ciprofloxacin Itching  . Penicillins Itching    ROS Review of Systems  Constitutional: Negative for fever, chills, appetite change and fatigue.  HENT: Negative for ear pain, congestion, sore throat, neck stiffness and dental problem.   Eyes: Negative for discharge, redness and visual disturbance.  Respiratory: Negative for cough, chest tightness, shortness of breath and wheezing.   Cardiovascular: Positive for chest pain. Negative for palpitations and leg swelling.  Gastrointestinal: Positive for abdominal  pain (resolving s/p recent umbillical hernia repair). Negative for nausea, vomiting, diarrhea and blood in stool.  Genitourinary: Negative for dysuria, urgency, frequency, hematuria, flank pain and difficulty urinating.  Musculoskeletal: Negative for myalgias, back pain, joint swelling and arthralgias.  Skin: Negative for pallor and rash.  Neurological: Negative for dizziness, speech difficulty, weakness and headaches.  Hematological: Negative for adenopathy. Does not bruise/bleed easily.  Psychiatric/Behavioral: Negative for confusion and disturbed wake/sleep cycle. The patient is not nervous/anxious.     PE; Blood pressure 137/92, pulse 78, temperature 97.4 F  (36.3 C), temperature source Temporal, height 5' 3.75" (1.619 m), weight 226 lb (102.513 kg), last menstrual period 08/04/2012, SpO2 98.00%. Gen: Alert, well appearing.  Patient is oriented to person, place, time, and situation. AFFECT: pleasant, lucid thought and speech. ENT:  Eyes: no injection, icteris, swelling, or exudate.  EOMI, PERRLA. Nose: no drainage or turbinate edema/swelling.  No injection or focal lesion.  Mouth: lips without lesion/swelling.   Neck - No masses or thyromegaly or limitation in range of motion CV: RRR, no m/r/g.   LUNGS: CTA bilat, nonlabored resps, good aeration in all lung fields.  Pertinent labs:   Lab Results  Component Value Date   WBC 6.6 08/19/2012   HGB 11.9* 08/19/2012   HCT 35.8* 08/19/2012   MCV 84.0 08/19/2012   PLT 215 08/19/2012   Lab Results  Component Value Date   CREATININE 0.66 08/19/2012   BUN 7 08/19/2012   NA 137 08/19/2012   K 3.9 08/19/2012   CL 104 08/19/2012   CO2 24 08/19/2012   ASSESSMENT AND PLAN:   New pt: old records reviewed in chart.  Atypical chest pain EKG fine. Will order rest/stress myoview. Most likely this is noncardiac pain but with her age and the potential for atypical presention of CAD in women, I believe she needs a stress test. She'll return soon for fasting CPE and we'll check FLP, CMET, CBC, and CMET at that time.  Colon cancer screening Ordered referral to GI for colon cancer screening.    No Follow-up on file.

## 2012-09-04 NOTE — Progress Notes (Signed)
[facility]  Ovidio Kin, MD,  FACS [address] [place]  [address] [place], [place]    [address] Phone:  [phone] FAX:  [phone]   Re:   Donna Willis DOB:   1961/01/02 MRN:   [id number]  ASSESSMENT AND PLAN: 1.  Repair of incacerated umbilical/ventral hernia - D. Quavion Boule - 08/19/2012  She has done well.  Return appt PRN.  2.  History of lap chole -  2002 3.  GERD 4.  Interstitial cystitis.  Followed by Dr. Monico Blitz  HISTORY OF PRESENT ILLNESS: Chief Complaint  Patient presents with  . Routine Post Op   Donna Willis is a 51 y.o. (DOB: 1961-09-21)  white female who is a patient of MCGOWEN,PHILIP H, MD and comes to me today for follow up of an incarcerated umbilical/ventral hernia.  She had done well. She returned to work on Mon, 10/21.  She has a sore spot to the lower left side of the repair.  Otherwise she is doing okay.  PHYSICAL EXAM: LMP 08/04/2012  Abdomen:  Still has a soft lump below the umbilicus.  I think that this is residual sac and should decrease in size with time.  DATA REVIEWED: None new.  Ovidio Kin, MD, FACS Office:  [phone]

## 2012-09-04 NOTE — Assessment & Plan Note (Signed)
EKG fine. Will order rest/stress myoview. Most likely this is noncardiac pain but with her age and the potential for atypical presention of CAD in women, I believe she needs a stress test. She'll return soon for fasting CPE and we'll check FLP, CMET, CBC, and CMET at that time.

## 2012-09-04 NOTE — Assessment & Plan Note (Signed)
Ordered referral to GI for colon cancer screening. 

## 2012-09-11 ENCOUNTER — Encounter (HOSPITAL_COMMUNITY): Payer: 59

## 2012-09-12 HISTORY — PX: CARDIOVASCULAR STRESS TEST: SHX262

## 2012-09-17 ENCOUNTER — Encounter: Payer: 59 | Admitting: Family Medicine

## 2012-09-17 ENCOUNTER — Ambulatory Visit (HOSPITAL_COMMUNITY): Payer: 59 | Attending: Cardiovascular Disease | Admitting: Radiology

## 2012-09-17 VITALS — BP 130/78 | Ht 65.0 in | Wt 222.0 lb

## 2012-09-17 DIAGNOSIS — R079 Chest pain, unspecified: Secondary | ICD-10-CM | POA: Insufficient documentation

## 2012-09-17 DIAGNOSIS — R5381 Other malaise: Secondary | ICD-10-CM | POA: Insufficient documentation

## 2012-09-17 DIAGNOSIS — R0789 Other chest pain: Secondary | ICD-10-CM

## 2012-09-17 MED ORDER — TECHNETIUM TC 99M SESTAMIBI GENERIC - CARDIOLITE
11.0000 | Freq: Once | INTRAVENOUS | Status: AC | PRN
  Administered 2012-09-17: 11 via INTRAVENOUS

## 2012-09-17 MED ORDER — TECHNETIUM TC 99M SESTAMIBI GENERIC - CARDIOLITE
33.0000 | Freq: Once | INTRAVENOUS | Status: AC | PRN
  Administered 2012-09-17: 33 via INTRAVENOUS

## 2012-09-17 NOTE — Progress Notes (Signed)
[facility] SITE 3 NUCLEAR MED [address] [id number] [place] [address] [phone]  Cardiology Nuclear Med Study  Donna Willis is a 51 y.o. female     MRN : [id number]     DOB: Sep 16, 1961  Procedure Date: 09/17/2012  Nuclear Med Background Indication for Stress Test:  Evaluation for Ischemia History:  No prior Cardiac History Cardiac Risk Factors: N/A  Symptoms:  Chest Pain, Fatigue and Occ. Lt. Arm Pain   Nuclear Pre-Procedure Caffeine/Decaff Intake:  None NPO After: 7:30am   Lungs:  clear O2 Sat: 96% on room air. IV 0.9% NS with Angio Cath:  22g  IV Site: R Forearm  IV Started by:  Bonnita Levan, RN  Chest Size (in):  44 Cup Size: C  Height: 5\' 5"  (1.651 m)  Weight:  222 lb (100.699 kg)  BMI:  Body mass index is 36.94 kg/(m^2). Tech Comments:  N/A    Nuclear Med Study 1 or 2 day study: 1 day  Stress Test Type:  Stress  Reading MD: Charlton Haws, MD  Order Authorizing Provider:  Nicoletta Ba, MD  Resting Radionuclide: Technetium 43m Sestamibi  Resting Radionuclide Dose: 11.0 mCi   Stress Radionuclide:  Technetium 28m Sestamibi  Stress Radionuclide Dose: 33.0 mCi           Stress Protocol Rest HR: 82 Stress HR: 164  Rest BP: 130/78 Stress BP: 160/80  Exercise Time (min): 7:29 METS: 9.2   Predicted Max HR: 170 bpm % Max HR: 96.47 bpm Rate Pressure Product: 36644   Dose of Adenosine (mg):  n/a Dose of Lexiscan: n/a mg  Dose of Atropine (mg): n/a Dose of Dobutamine: n/a mcg/kg/min (at max HR)  Stress Test Technologist: Bonnita Levan, RN  Nuclear Technologist:  Domenic Polite, CNMT     Rest Procedure:  Myocardial perfusion imaging was performed at rest 45 minutes following the intravenous administration of Technetium 86m Sestamibi. Rest ECG: NSR  Stress Procedure:  The patient performed treadmill exercise using a Bruce  Protocol for 7:29 minutes. The patient stopped due to fatigue and dyspnea and denied any chest pain.  There were no  significant ST-T wave changes.  Technetium 79m Sestamibi was injected at peak exercise and myocardial perfusion imaging was performed after a brief delay. Stress ECG: No significant change from baseline ECG  QPS Raw Data Images:  Normal; no motion artifact; normal heart/lung ratio. Stress Images:  Normal homogeneous uptake in all areas of the myocardium. Rest Images:  Normal homogeneous uptake in all areas of the myocardium. Subtraction (SDS):  Normal Transient Ischemic Dilatation (Normal <1.22):  0.93 Lung/Heart Ratio (Normal <0.45):  0.39  Quantitative Gated Spect Images QGS EDV:  55 ml QGS ESV:  11 ml  Impression Exercise Capacity:  Fair exercise capacity. BP Response:  Normal blood pressure response. Clinical Symptoms:  No significant symptoms noted. ECG Impression:  No significant ST segment change suggestive of ischemia. Comparison with Prior Nuclear Study: No images to compare  Overall Impression:  Normal stress nuclear study.  LV Ejection Fraction: 80%.  LV Wall Motion:  NL LV Function; NL Wall Motion  Charlton Haws

## 2012-09-18 ENCOUNTER — Encounter: Payer: Self-pay | Admitting: Family Medicine

## 2012-09-18 ENCOUNTER — Telehealth: Payer: Self-pay | Admitting: Family Medicine

## 2012-09-18 NOTE — Telephone Encounter (Signed)
Pls notify patient that her recent stress test was completely normal.  Reassure her--she appears to have a healthy heart.  --thx

## 2012-09-18 NOTE — Telephone Encounter (Signed)
I have attempted to contact this patient by phone with the following results: left message to return my call on answering machine (mobile).  

## 2012-09-19 NOTE — Telephone Encounter (Signed)
RC from patient.  Notified of results.

## 2012-09-22 ENCOUNTER — Ambulatory Visit: Payer: 59 | Admitting: Family Medicine

## 2012-09-25 ENCOUNTER — Ambulatory Visit (INDEPENDENT_AMBULATORY_CARE_PROVIDER_SITE_OTHER): Payer: 59 | Admitting: Family Medicine

## 2012-09-25 ENCOUNTER — Encounter: Payer: Self-pay | Admitting: Family Medicine

## 2012-09-25 VITALS — BP 134/86 | HR 71 | Temp 97.8°F | Ht 63.75 in | Wt 227.0 lb

## 2012-09-25 DIAGNOSIS — Z0000 Encounter for general adult medical examination without abnormal findings: Secondary | ICD-10-CM

## 2012-09-25 LAB — COMPREHENSIVE METABOLIC PANEL
ALT: 17 U/L (ref 0–35)
AST: 18 U/L (ref 0–37)
Albumin: 3.9 g/dL (ref 3.5–5.2)
Alkaline Phosphatase: 49 U/L (ref 39–117)
BUN: 9 mg/dL (ref 6–23)
Calcium: 8.9 mg/dL (ref 8.4–10.5)
Chloride: 108 mEq/L (ref 96–112)
Potassium: 3.8 mEq/L (ref 3.5–5.1)
Sodium: 138 mEq/L (ref 135–145)
Total Protein: 7.8 g/dL (ref 6.0–8.3)

## 2012-09-25 LAB — LIPID PANEL
HDL: 41 mg/dL (ref >39.00)
VLDL: 32.6 mg/dL (ref 0.0–40.0)

## 2012-09-25 LAB — TSH: TSH: 1.23 u[IU]/mL (ref 0.35–5.50)

## 2012-09-25 NOTE — Progress Notes (Signed)
Office Note 09/25/2012  CC:  Chief Complaint  Patient presents with  . Annual Exam    fasting labs    HPI:  Donna Willis is a 51 y.o. White female who is here for CPE.  Since I last saw her she had a lexiscan myoview and this was normal.  She has no acute complaints today.  She is set to have her pap/pelvic and mammogram at the end of this month through her GYN MD.  She has appt with Dr. Leone Payor in December for colonoscopy/colon cancer screening.   Past Medical History  Diagnosis Date  . GERD (gastroesophageal reflux disease)   . Hernia   . Interstitial cystitis     Dr. Earlene Plater: hasn't seen him in yrs or so, was on elmiron and coldn't tell much difference.  . Hay fever     seasonal  . Atypical chest pain 08/2012    Normal Myoview 09/2012    Past Surgical History  Procedure Date  . Cholecystectomy 2002  . Cesarean section 1988  . Tonsillectomy and adenoidectomy 1974  . Hernia repair 2003    umbilical, during lap chole  . Umbilical hernia repair 08/19/2012    Procedure: LAPAROSCOPIC UMBILICAL HERNIA;  Surgeon: Kandis Cocking, MD;  Location: [facility];  Service: General;  Laterality: N/A;  . Cardiovascular stress test 09/2012    Rest/Stress myoview: normal    Family History  Problem Relation Age of Onset  . Colon polyps Mother   . Diverticulitis Father     History   Social History  . Marital Status: Married    Spouse Name: N/A    Number of Children: N/A  . Years of Education: N/A   Occupational History  . Not on file.   Social History Main Topics  . Smoking status: Never Smoker   . Smokeless tobacco: Never Used  . Alcohol Use: Yes     Comment: rare  . Drug Use: No  . Sexually Active: No   Other Topics Concern  . 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  No T/A/Ds     Outpatient Prescriptions Prior to Visit  Medication Sig Dispense Refill  . HYDROcodone-acetaminophen (NORCO/VICODIN) 5-325 MG per tablet Take 1-2 tablets by mouth every 4 (four) hours as needed.  40 tablet  0  . ibuprofen (ADVIL,MOTRIN) 200 MG tablet Take 400 mg by mouth every 6 (six) hours as needed. pain      . omeprazole (PRILOSEC) 20 MG capsule Take 20 mg by mouth daily.        Allergies  Allergen Reactions  . Ciprofloxacin Itching  . Penicillins Itching    ROS Review of Systems  Constitutional: Negative for fever, chills, appetite change and fatigue.  HENT: Negative for ear pain, congestion, sore throat, neck stiffness and dental problem.   Eyes: Negative for discharge, redness and visual disturbance.  Respiratory: Negative for cough, chest tightness, shortness of breath and wheezing.   Cardiovascular: Negative for chest pain, palpitations and leg swelling.  Gastrointestinal: Negative for nausea, vomiting, abdominal pain, diarrhea and blood in stool.  Genitourinary: Negative for dysuria, urgency, frequency, hematuria, flank pain and difficulty urinating.  Musculoskeletal: Negative for myalgias, back pain, joint swelling and arthralgias.  Skin: Negative for pallor and rash.  Neurological: Negative for dizziness, speech difficulty, weakness and headaches.  Hematological: Negative  for adenopathy. Does not bruise/bleed easily.  Psychiatric/Behavioral: Negative for confusion and sleep disturbance. The patient is not nervous/anxious.     PE; Blood pressure 134/86, pulse 71, temperature 97.8 F (36.6 C), temperature source Temporal, height 5' 3.75" (1.619 m), weight 227 lb (102.967 kg), last menstrual period 08/29/2012, SpO2 98.00%. Gen: Alert, well appearing.  Patient is oriented to person, place, time, and situation. AFFECT: pleasant, lucid thought and speech. ENT: Ears: EACs clear, normal epithelium.  TMs with good light reflex and landmarks bilaterally.  Eyes: no injection,  icteris, swelling, or exudate.  EOMI, PERRLA. Nose: no drainage or turbinate edema/swelling.  No injection or focal lesion.  Mouth: lips without lesion/swelling.  Oral mucosa pink and moist.  Dentition intact and without obvious caries or gingival swelling.  Oropharynx without erythema, exudate, or swelling.  Neck: supple/nontender.  No LAD, mass, or TM.  Carotid pulses 2+ bilaterally, without bruits. CV: RRR, no m/r/g.   LUNGS: CTA bilat, nonlabored resps, good aeration in all lung fields. ABD: soft, NT, ND, BS normal.  No hepatospenomegaly or mass.  No bruits. EXT: no clubbing, cyanosis, or edema.  Musculoskeletal: no joint swelling, erythema, warmth, or tenderness.  ROM of all joints intact. Skin - no sores or suspicious lesions or rashes or color changes  Pertinent labs:  None today  ASSESSMENT AND PLAN:   Health maintenance examination Reviewed age and gender appropriate health maintenance issues (prudent diet, regular exercise, health risks of tobacco and excessive alcohol, use of seatbelts, fire alarms in home, use of sunscreen).  Also reviewed age and gender appropriate health screening as well as vaccine recommendations. Fasting lipids, CMET, and TSH today.  She had a CBC in mid October already this year. Vaccines all UTD.  Colon cancer screening is scheduled. GYN screening is arranged. Discussed more aggressive diet/exercise/TLC to lose a bit of weight and lower risk of CV dz.    FOLLOW UP:  No Follow-up on file.

## 2012-09-25 NOTE — Patient Instructions (Signed)

## 2012-09-25 NOTE — Assessment & Plan Note (Signed)
Reviewed age and gender appropriate health maintenance issues (prudent diet, regular exercise, health risks of tobacco and excessive alcohol, use of seatbelts, fire alarms in home, use of sunscreen).  Also reviewed age and gender appropriate health screening as well as vaccine recommendations. Fasting lipids, CMET, and TSH today.  She had a CBC in mid October already this year. Vaccines all UTD.  Colon cancer screening is scheduled. GYN screening is arranged. Discussed more aggressive diet/exercise/TLC to lose a bit of weight and lower risk of CV dz.

## 2012-10-02 ENCOUNTER — Ambulatory Visit (AMBULATORY_SURGERY_CENTER): Payer: 59 | Admitting: *Deleted

## 2012-10-02 VITALS — Ht 65.0 in | Wt 226.0 lb

## 2012-10-02 DIAGNOSIS — Z1211 Encounter for screening for malignant neoplasm of colon: Secondary | ICD-10-CM

## 2012-10-02 MED ORDER — NA SULFATE-K SULFATE-MG SULF 17.5-3.13-1.6 GM/177ML PO SOLN: 1.0000 | Freq: Once | ORAL | Status: DC

## 2012-10-02 NOTE — Progress Notes (Signed)
No egg or soy allergy. ewm 

## 2012-10-22 ENCOUNTER — Encounter: Payer: Self-pay | Admitting: Internal Medicine

## 2012-10-22 ENCOUNTER — Ambulatory Visit (AMBULATORY_SURGERY_CENTER): Payer: 59 | Admitting: Internal Medicine

## 2012-10-22 VITALS — BP 112/77 | HR 69 | Temp 98.0°F | Resp 21 | Ht 65.0 in | Wt 226.0 lb

## 2012-10-22 DIAGNOSIS — D126 Benign neoplasm of colon, unspecified: Secondary | ICD-10-CM

## 2012-10-22 DIAGNOSIS — Z1211 Encounter for screening for malignant neoplasm of colon: Secondary | ICD-10-CM

## 2012-10-22 MED ORDER — SODIUM CHLORIDE 0.9 % IV SOLN: 500.0000 mL | INTRAVENOUS | Status: DC

## 2012-10-22 NOTE — Progress Notes (Signed)
Patient did not experience any of the following events: a burn prior to discharge; a fall within the facility; wrong site/side/patient/procedure/implant event; or a hospital transfer or hospital admission upon discharge from the facility. (G8907) Patient did not have preoperative order for IV antibiotic SSI prophylaxis. (G8918)  

## 2012-10-22 NOTE — Patient Instructions (Addendum)
One polyp was removed from the colon. It looks benign. Remainder of the exam was ok - normal.  Thank you for choosing me and [facility].  Iva Boop, MD, FACG  YOU HAD AN ENDOSCOPIC PROCEDURE TODAY AT THE [facility]: Refer to the procedure report that was given to you for any specific questions about what was found during the examination.  If the procedure report does not answer your questions, please call your gastroenterologist to clarify.  If you requested that your care partner not be given the details of your procedure findings, then the procedure report has been included in a sealed envelope for you to review at your convenience later.  YOU SHOULD EXPECT: Some feelings of bloating in the abdomen. Passage of more gas than usual.  Walking can help get rid of the air that was put into your GI tract during the procedure and reduce the bloating. If you had a lower endoscopy (such as a colonoscopy or flexible sigmoidoscopy) you may notice spotting of blood in your stool or on the toilet paper. If you underwent a bowel prep for your procedure, then you may not have a normal bowel movement for a few days.  DIET: Your first meal following the procedure should be a light meal and then it is ok to progress to your normal diet.  A half-sandwich or bowl of soup is an example of a good first meal.  Heavy or fried foods are harder to digest and may make you feel nauseous or bloated.  Likewise meals heavy in dairy and vegetables can cause extra gas to form and this can also increase the bloating.  Drink plenty of fluids but you should avoid alcoholic beverages for 24 hours.  ACTIVITY: Your care partner should take you home directly after the procedure.  You should plan to take it easy, moving slowly for the rest of the day.  You can resume normal activity the day after the procedure however you should NOT DRIVE or use heavy machinery for 24 hours (because of the sedation medicines  used during the test).    SYMPTOMS TO REPORT IMMEDIATELY: A gastroenterologist can be reached at any hour.  During normal business hours, 8:30 AM to 5:00 PM Monday through Friday, call [phone].  After hours and on weekends, please call the GI answering service at [phone] who will take a message and have the physician on call contact you.   Following lower endoscopy (colonoscopy or flexible sigmoidoscopy):  Excessive amounts of blood in the stool  Significant tenderness or worsening of abdominal pains  Swelling of the abdomen that is new, acute  Fever of 100F or higher  FOLLOW UP: If any biopsies were taken you will be contacted by phone or by letter within the next 1-3 weeks.  Call your gastroenterologist if you have not heard about the biopsies in 3 weeks.  Our staff will call the home number listed on your records the next business day following your procedure to check on you and address any questions or concerns that you may have at that time regarding the information given to you following your procedure. This is a courtesy call and so if there is no answer at the home number and we have not heard from you through the emergency physician on call, we will assume that you have returned to your regular daily activities without incident.  SIGNATURES/CONFIDENTIALITY: You and/or your care partner have signed paperwork which will be entered into your  electronic medical record.  These signatures attest to the fact that that the information above on your After Visit Summary has been reviewed and is understood.  Full responsibility of the confidentiality of this discharge information lies with you and/or your care-partner.

## 2012-10-22 NOTE — Op Note (Signed)
[facility] [address] [place], [address]   COLONOSCOPY PROCEDURE REPORT  PATIENT: Donna Willis, Donna Willis  MR#: [id number] BIRTHDATE: [birth year] , 51  yrs. old GENDER: Female ENDOSCOPIST: Iva Boop, MD, [facility] REFERRED BY:   Earley Favor, MD PROCEDURE DATE:  10/22/2012 PROCEDURE:   Colonoscopy with snare polypectomy ASA CLASS:   Class II INDICATIONS:average risk screening. MEDICATIONS: MAC sedation, administered by CRNA, These medications were titrated to patient response per physician's verbal order, and propofol (Diprivan) 400mg  IV  DESCRIPTION OF PROCEDURE:   After the risks benefits and alternatives of the procedure were thoroughly explained, informed consent was obtained.  A digital rectal exam revealed no abnormalities of the rectum.   The LB [id number]-[id number]  endoscope was introduced through the anus and advanced to the cecum, which was identified by both the appendix and ileocecal valve. No adverse events experienced.   The quality of the prep was Suprep good  The instrument was then slowly withdrawn as the colon was fully examined.      COLON FINDINGS: A smooth flat polyp measuring 4x10 mm in size with a mucous cap was found at the cecum.  A polypectomy was performed with a cold snare.  The resection was complete and the polyp tissue was completely retrieved.   The colon mucosa was otherwise normal. A right colon retroflexion was performed.  Retroflexed views revealed no abnormalities. The time to cecum=3 minutes 0 seconds. Withdrawal time=16 minutes 41 seconds.  The scope was withdrawn and the procedure completed. COMPLICATIONS: There were no complications.  ENDOSCOPIC IMPRESSION: 1.   Flat polyp measuring 4x10 mm in size was found at the cecum; polypectomy was performed with a cold snare 2.   The colon mucosa was otherwise normal - good prep   RECOMMENDATIONS: Timing of repeat colonoscopy will be determined by  pathology findings.   eSigned:  Iva Boop, MD, [facility] 10/22/2012 12:17 PM   cc: Earley Favor, MD and The Patient

## 2012-10-22 NOTE — Progress Notes (Signed)
Called to room to assist during endoscopic procedure.  Patient ID and intended procedure confirmed with present staff. Received instructions for my participation in the procedure from the performing physician.  

## 2012-10-23 ENCOUNTER — Telehealth: Payer: Self-pay | Admitting: *Deleted

## 2012-10-23 NOTE — Telephone Encounter (Signed)
Left message that we called for f/u 

## 2012-10-27 ENCOUNTER — Encounter: Payer: Self-pay | Admitting: Family Medicine

## 2012-10-28 ENCOUNTER — Encounter: Payer: Self-pay | Admitting: Internal Medicine

## 2012-10-28 DIAGNOSIS — Z860100 Personal history of colon polyps, unspecified: Secondary | ICD-10-CM

## 2012-10-28 HISTORY — DX: Personal history of colonic polyps: Z86.010

## 2012-10-28 HISTORY — DX: Personal history of colon polyps, unspecified: Z86.0100

## 2012-10-28 NOTE — Progress Notes (Signed)
Quick Note:  1 cm tubular adenoma Repeat colon 10/2015 approx ______

## 2012-10-30 ENCOUNTER — Encounter: Payer: Self-pay | Admitting: Family Medicine

## 2013-06-16 ENCOUNTER — Ambulatory Visit (INDEPENDENT_AMBULATORY_CARE_PROVIDER_SITE_OTHER): Payer: 59 | Admitting: Nurse Practitioner

## 2013-06-16 ENCOUNTER — Encounter: Payer: Self-pay | Admitting: Nurse Practitioner

## 2013-06-16 VITALS — BP 130/84 | HR 82 | Temp 98.2°F | Ht 63.75 in | Wt 242.0 lb

## 2013-06-16 DIAGNOSIS — J9809 Other diseases of bronchus, not elsewhere classified: Secondary | ICD-10-CM | POA: Insufficient documentation

## 2013-06-16 DIAGNOSIS — J988 Other specified respiratory disorders: Secondary | ICD-10-CM

## 2013-06-16 MED ORDER — METHYLPREDNISOLONE ACETATE 40 MG/ML IJ SUSP
40.0000 mg | Freq: Once | INTRAMUSCULAR | Status: AC
  Administered 2013-06-16: 40 mg via INTRAMUSCULAR

## 2013-06-16 MED ORDER — BENZONATATE 100 MG PO CAPS: ORAL_CAPSULE | ORAL | Status: DC

## 2013-06-16 MED ORDER — PREDNISONE 10 MG PO TABS: ORAL_TABLET | ORAL | Status: DC

## 2013-06-16 NOTE — Progress Notes (Signed)
Subjective:     Donna Willis is a 52 y.o. female who presents for evaluation of nonproductive cough. Symptoms began 1 week ago. Symptoms have been unchanged since that time. Past history is significant for occasional episodes of bronchitis, pneumonia and bronchospasm r/t allergic trigger, and histoplasmosis. CXR 1 year ago showed scattered calified granulomas, calcified hylar lymph nodes. Triggers in past include lillies, and hand sanitizer gels.  The following portions of the patient's history were reviewed and updated as appropriate: allergies, current medications, past medical history, past social history and problem list.  Review of Systems Constitutional: positive for fatigue and due to frequent coughing, negative for chills and fevers Ears, nose, mouth, throat, and face: negative for earaches, nasal congestion and sore throat Respiratory: positive for cough and past episodes with same symptoms of dry cough, relieved with oral prednisone, negative for hemoptysis, pleurisy/chest pain, sputum and wheezing Cardiovascular: negative for chest pain, chest pressure/discomfort, irregular heart beat, palpitations and syncope Gastrointestinal: positive for taking nexium with no relief in cough episodes, negative for abdominal pain, nausea and reflux symptoms, . Hematologic/lymphatic: Hx of histoplasmosis with evidence of calcified granulomas on CXR Allergic/Immunologic: positive for bronchospasm r/t flowers & hand sanitizer gels., negative for anaphylaxis    Objective:    BP 130/84  Pulse 82  Temp(Src) 98.2 F (36.8 C) (Oral)  Ht 5' 3.75" (1.619 m)  Wt 242 lb (109.77 kg)  BMI 41.88 kg/m2  SpO2 96%  LMP 05/17/2013 General appearance: alert, cooperative, appears stated age, fatigued, no distress and moderately obese Head: Normocephalic, without obvious abnormality, atraumatic Eyes: negative findings: lids and lashes normal, conjunctivae and sclerae normal, corneas clear, pupils equal, round,  reactive to light and accomodation and looks tired Ears: normal TM's and external ear canals both ears Nose: clear discharge, no polyps, no crusting or bleeding points Throat: tonsils absent, mucosa moist & pink Lungs: clear to auscultation bilaterally and frequent coughing made assessment challenging Heart: regular rate and rhythm, S1, S2 normal, no murmur, click, rub or gallop Skin: Skin color, texture, turgor normal. No rashes or lesions Lymph nodes: 1 R posterior enlarged node-size of green pea-moveable &NT; otherwise no cervical, tonsillar, or supraclavicular LAD    Assessment:    Reactive Airway Disease    Plan:  40 mg prednisone IM in office, followed by oral prednisone taper. Tessalon pearles. May benefit from singular. Ref to pulmonology-will appreciate input for Tx of current episode & prophylaxis.  Worsening signs and symptoms discussed. Rest, fluids, acetaminophen, and humidification. Follow up as needed for persistent, worsening cough, or appearance of new symptoms.

## 2013-06-16 NOTE — Patient Instructions (Signed)
Bronchospasm, Adult  Bronchospasm means that there is a spasm or tightening of the airways going into the lungs. Because the airways go into a spasm and get smaller it makes breathing more difficult.  For reasons not completely known, workings (functions) of the airways designed to protect the lungs become over active. This causes the airways to become more sensitive to:  · Infection.  · Weather.  · Exercise.  · Irritants.  · Things that cause allergic reactions or allergies (allergens).  Frequent coughing or respiratory episodes should be checked for the cause. This condition may be made worse by exercise.  CAUSES   Inflammation is often the cause of this condition. Allergy, viral respiratory infections, or irritants in the air often cause this problem. Allergic reactions produce immediate and delayed responses. Late reactions may produce more serious inflammation. This may lead to increased reactivity of the airways. Sometimes this is inherited.  Some common triggers are:  · Allergies.  · Infection commonly triggers attacks. Antibiotics are not helpful for viral infections and usually do not help with attacks of bronchospasm.  · Exercise (running, etc.) can trigger an attack. Proper pre-exercise medications help most individuals participate in sports. Swimming is the least likely sport to cause problems.  · Irritants (for example, pollution, cigarette smoke, strong odors, aerosol sprays, paint fumes, etc.) may trigger attacks. You cannot smoke and do not allow smoking in your home. This is absolutely necessary. Show this instruction to mates, relatives and significant others that may not agree with you.  · Weather changes may cause lung problems but moving around trying to find an ideal climate does not seem to be overly helpful. Winds increase molds and pollens in the air. Rain refreshes the air by washing irritants out. Cold air may cause irritation.  · Emotional problems do not cause lung problems but can  trigger attacks.  SYMPTOMS   Wheezing is the most common symptom. Frequent coughing (with or without exercise and or crying) and repeated respiratory infections are all early warning signs of bronchospasm. Chest tightness and shortness of breath are other symptoms.  DIAGNOSIS   Early hidden bronchospasm may go for long periods of time without being detected. This is especially true if wheezing cannot be detected by your caregiver. Lung (pulmonary) function studies may help with diagnosis in these cases.  HOME CARE INSTRUCTIONS   · It is necessary to remain calm during an attack. Try to relax and breathe more slowly. During this time medications may be given. If any breathing problems seem to be getting worse and are unresponsive to treatment seek immediate medical care.  · If you have severe breathing difficulty or have had a life threatening attack it is probably a good idea for you to learn how to give adrenaline (epi-pen) or use an anaphylaxis kit. Your caregiver can help you with this. These are the same kits carried by people who have severe allergic reactions. This is especially important if you do not have readily accessible medical care.  · With any severe breathing problems where epinephrine (adrenaline) has been given at home call 911 immediately as the delayed reaction may be even more severe.  SEEK MEDICAL CARE IF:   · There is wheezing and shortness of breath, even if medications are given to prevent attacks.  · An oral temperature above 102° F (38.9° C) develops.  · There are muscle aches, chest pain, or thickening of sputum.  · The sputum changes from clear or white to yellow, green,   gray, or bloody.  · There are problems that may be related to the medicine you are given, such as a rash, itching, swelling, or trouble breathing.  SEEK IMMEDIATE MEDICAL CARE IF:   · The usual medicines do not stop your wheezing, or there is increased coughing.  · You have increased difficulty breathing.  MAKE SURE YOU:    · Understand these instructions.  · Will watch your condition.  · Will get help right away if you are not doing well or get worse.  Document Released: 11/01/2003 Document Revised: 01/21/2012 Document Reviewed: 06/16/2008  ExitCare® Patient Information ©2014 [employer].

## 2013-08-06 ENCOUNTER — Institutional Professional Consult (permissible substitution): Payer: 59 | Admitting: Internal Medicine

## 2013-08-31 ENCOUNTER — Ambulatory Visit (INDEPENDENT_AMBULATORY_CARE_PROVIDER_SITE_OTHER): Payer: 59 | Admitting: Pulmonary Disease

## 2013-08-31 ENCOUNTER — Encounter: Payer: Self-pay | Admitting: Pulmonary Disease

## 2013-08-31 VITALS — BP 132/72 | HR 100 | Ht 65.0 in | Wt 244.0 lb

## 2013-08-31 DIAGNOSIS — Z8619 Personal history of other infectious and parasitic diseases: Secondary | ICD-10-CM

## 2013-08-31 DIAGNOSIS — R053 Chronic cough: Secondary | ICD-10-CM | POA: Insufficient documentation

## 2013-08-31 DIAGNOSIS — J309 Allergic rhinitis, unspecified: Secondary | ICD-10-CM | POA: Insufficient documentation

## 2013-08-31 DIAGNOSIS — J45909 Unspecified asthma, uncomplicated: Secondary | ICD-10-CM

## 2013-08-31 DIAGNOSIS — R05 Cough: Secondary | ICD-10-CM

## 2013-08-31 DIAGNOSIS — J4520 Mild intermittent asthma, uncomplicated: Secondary | ICD-10-CM | POA: Insufficient documentation

## 2013-08-31 DIAGNOSIS — J4530 Mild persistent asthma, uncomplicated: Secondary | ICD-10-CM

## 2013-08-31 HISTORY — DX: Mild persistent asthma, uncomplicated: J45.30

## 2013-08-31 HISTORY — DX: Personal history of other infectious and parasitic diseases: Z86.19

## 2013-08-31 MED ORDER — ALBUTEROL SULFATE HFA 108 (90 BASE) MCG/ACT IN AERS: 2.0000 | INHALATION_SPRAY | Freq: Four times a day (QID) | RESPIRATORY_TRACT | Status: DC | PRN

## 2013-08-31 NOTE — Progress Notes (Signed)
Chief Complaint  Patient presents with  . Pulmonary Consult    referred by Maximino Sarin, NP    History of Present Illness: Donna Willis is a 52 y.o. female never smoker for evaluation of cough.  She has noticed trouble with a cough for a while.  This has been getting worse.  She has more trouble around strong smells, perfumes, and lillies.  She feels like her throat is going to close.  She has used proair, and this helps her symptoms.  She was treated with prednisone in August 2014 and this helped.    She has never been told she has asthma.  She does get wheezing at times, but this comes more from her throat than her chest.  She does sometimes get chest tightness.    She has trouble with allergies.  She is allergic to ragweed, grasses, dust, dogs, and cats.  She does not have any pets.  She used to have trouble with hives years ago >> never found out reason why this happened.  She does not usually have severe reactions to insect stings.  She gets itching from using penicillin and cipro.  She never smoked.  Her son does smoke, but not in the house.  Her son has asthma, and her mother has chronic bronchitis.  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  She is from [place].  She was told while in nursing school that she has an abnormal chest xray related to histoplasmosis.  She was told this was from exposure as a child to chickens on her grandparents farm.  She will sometimes get a cough at night.  She gets itchiness on her hands at times, but denies any other rashes.  She reports that her symptoms have gotten better over the past few weeks.  Tests:   Donna Willis  has a past medical history of GERD (gastroesophageal reflux disease); Hernia; Interstitial cystitis; Hay fever; Atypical chest pain (08/2012); Adenomatous colon polyp (10/22/12); and Personal history of  adenomatous colonic polyp (10/28/2012).  Donna Willis  has past surgical history  that includes Cholecystectomy (2002); Cesarean section (1988); Tonsillectomy and adenoidectomy (1974); Hernia repair (2003); Umbilical hernia repair (08/19/2012); Cardiovascular stress test (09/2012); and Colonoscopy w/ polypectomy (10/22/12).  Prior to Admission medications   Medication Sig Start Date End Date Taking? Authorizing Provider  ibuprofen (ADVIL,MOTRIN) 200 MG tablet Take 400 mg by mouth every 6 (six) hours as needed. pain   Yes Historical Provider, MD  Omega-3 Fatty Acids (FISH OIL PO) Take 1 capsule by mouth daily.   Yes Historical Provider, MD  omeprazole (PRILOSEC) 20 MG capsule Take 20 mg by mouth daily. 06/29/12 08/31/14 Yes Collene Gobble, MD    Allergies  Allergen Reactions  . Ciprofloxacin Itching  . Penicillins Itching    Her family history includes Colon polyps in her mother; Diverticulitis in her father; Other in her maternal uncle. There is no history of Rectal cancer.  She  reports that she has never smoked. She has never used smokeless tobacco. She reports that she drinks alcohol. She reports that she does not use illicit drugs.  Review of Systems  Constitutional: Negative for fever, chills, diaphoresis, activity change, appetite change, fatigue and unexpected weight change.  HENT: Negative for congestion, dental problem, ear discharge, ear pain, facial swelling, hearing loss, mouth sores, nosebleeds, postnasal drip, rhinorrhea, sinus pressure, sneezing, sore throat, tinnitus, trouble swallowing and voice change.   Eyes: Negative for  photophobia, discharge, itching and visual disturbance.  Respiratory: Positive for cough and shortness of breath. Negative for apnea, choking, chest tightness, wheezing and stridor.   Cardiovascular: Negative for chest pain, palpitations and leg swelling.  Gastrointestinal: Positive for abdominal pain. Negative for nausea, vomiting, constipation, blood in stool and abdominal distention.  Genitourinary: Negative for dysuria, urgency,  frequency, hematuria, flank pain, decreased urine volume and difficulty urinating.  Musculoskeletal: Negative for arthralgias, back pain, gait problem, joint swelling, myalgias, neck pain and neck stiffness.  Skin: Negative for color change, pallor and rash.  Neurological: Negative for dizziness, tremors, seizures, syncope, speech difficulty, weakness, light-headedness, numbness and headaches.  Hematological: Negative for adenopathy. Does not bruise/bleed easily.  Psychiatric/Behavioral: Negative for confusion, sleep disturbance and agitation. The patient is not nervous/anxious.    Physical Exam:  General - No distress ENT - No sinus tenderness, no oral exudate, no LAN, no thyromegaly, TM clear, pupils equal/reactive Cardiac - s1s2 regular, no murmur, pulses symmetric Chest - No wheeze/rales/dullness, good air entry, normal respiratory excursion Back - No focal tenderness Abd - Soft, non-tender, no organomegaly, + bowel sounds Ext - No edema Neuro - Normal strength, cranial nerves intact Skin - No rashes Psych - Normal mood, and behavior   Lab Results  Component Value Date   WBC 6.6 08/19/2012   HGB 11.9* 08/19/2012   HCT 35.8* 08/19/2012   MCV 84.0 08/19/2012   PLT 215 08/19/2012    Lab Results  Component Value Date   CREATININE 0.7 09/25/2012   BUN 9 09/25/2012   NA 138 09/25/2012   K 3.8 09/25/2012   CL 108 09/25/2012   CO2 22 09/25/2012    Lab Results  Component Value Date   ALT 17 09/25/2012   AST 18 09/25/2012   ALKPHOS 49 09/25/2012   BILITOT 0.5 09/25/2012     Clinical Data: Cough.  CHEST - 2 VIEW  Comparison: None.  Findings: Trachea is midline. Heart size normal. There are calcified hilar lymph nodes. Scattered calcified granulomas in the lungs. Lungs are otherwise clear. No pleural fluid.  IMPRESSION:  No acute findings.  Original Report Authenticated By: Reyes Ivan, M.D.   Assessment/Plan:  Coralyn Helling, MD [facility] Pager:  [phone]

## 2013-08-31 NOTE — Patient Instructions (Signed)
Proair two puffs up to four times per day as needed for cough, wheeze, or chest congestion Will schedule breathing test (PFT) Follow up in 2 months

## 2013-08-31 NOTE — Assessment & Plan Note (Signed)
Not much of an issue at present.  She can use prn OTC medications if needed.  She does not need additional allergy testing at present.

## 2013-08-31 NOTE — Assessment & Plan Note (Signed)
She has radiographic changes from August 2013 which are consistent with given history of prior histoplasmosis.  This is not an active issue.  She does not need additional chest imaging studies at this time.

## 2013-08-31 NOTE — Assessment & Plan Note (Addendum)
She has intermittent cough related to varies exposures and allergens.  She likely has intermittent asthma.  Her cough is not much of an issue at present.  To further assess will arrange for pulmonary function testing.  She can use prn proair.

## 2013-08-31 NOTE — Progress Notes (Deleted)
  Subjective:    Patient ID: Donna Willis, female    DOB: 1961-04-19, 52 y.o.   MRN: [id number]  HPI    Review of Systems  Constitutional: Negative for fever, chills, diaphoresis, activity change, appetite change, fatigue and unexpected weight change.  HENT: Negative for congestion, dental problem, ear discharge, ear pain, facial swelling, hearing loss, mouth sores, nosebleeds, postnasal drip, rhinorrhea, sinus pressure, sneezing, sore throat, tinnitus, trouble swallowing and voice change.   Eyes: Negative for photophobia, discharge, itching and visual disturbance.  Respiratory: Positive for cough and shortness of breath. Negative for apnea, choking, chest tightness, wheezing and stridor.   Cardiovascular: Negative for chest pain, palpitations and leg swelling.  Gastrointestinal: Positive for abdominal pain. Negative for nausea, vomiting, constipation, blood in stool and abdominal distention.  Genitourinary: Negative for dysuria, urgency, frequency, hematuria, flank pain, decreased urine volume and difficulty urinating.  Musculoskeletal: Negative for arthralgias, back pain, gait problem, joint swelling, myalgias, neck pain and neck stiffness.  Skin: Negative for color change, pallor and rash.  Neurological: Negative for dizziness, tremors, seizures, syncope, speech difficulty, weakness, light-headedness, numbness and headaches.  Hematological: Negative for adenopathy. Does not bruise/bleed easily.  Psychiatric/Behavioral: Negative for confusion, sleep disturbance and agitation. The patient is not nervous/anxious.        Objective:   Physical Exam        Assessment & Plan:

## 2013-08-31 NOTE — Assessment & Plan Note (Signed)
She is to use prn proair.  Depending on her response and results for PFT, will decide if she needs more maintenance inhaler therapy.

## 2013-09-07 ENCOUNTER — Encounter: Payer: Self-pay | Admitting: Family Medicine

## 2013-09-17 ENCOUNTER — Other Ambulatory Visit: Payer: Self-pay

## 2013-10-19 ENCOUNTER — Encounter: Payer: Self-pay | Admitting: Family Medicine

## 2013-10-22 ENCOUNTER — Other Ambulatory Visit: Payer: Self-pay | Admitting: Internal Medicine

## 2013-10-22 DIAGNOSIS — R05 Cough: Secondary | ICD-10-CM

## 2013-10-28 ENCOUNTER — Ambulatory Visit (INDEPENDENT_AMBULATORY_CARE_PROVIDER_SITE_OTHER): Payer: 59 | Admitting: Pulmonary Disease

## 2013-10-28 ENCOUNTER — Encounter: Payer: Self-pay | Admitting: Pulmonary Disease

## 2013-10-28 VITALS — BP 112/70 | HR 99 | Ht 64.0 in | Wt 250.0 lb

## 2013-10-28 DIAGNOSIS — J45909 Unspecified asthma, uncomplicated: Secondary | ICD-10-CM

## 2013-10-28 DIAGNOSIS — R05 Cough: Secondary | ICD-10-CM

## 2013-10-28 DIAGNOSIS — R059 Cough, unspecified: Secondary | ICD-10-CM

## 2013-10-28 DIAGNOSIS — J4520 Mild intermittent asthma, uncomplicated: Secondary | ICD-10-CM

## 2013-10-28 DIAGNOSIS — J309 Allergic rhinitis, unspecified: Secondary | ICD-10-CM

## 2013-10-28 MED ORDER — BECLOMETHASONE DIPROPIONATE 80 MCG/ACT IN AERS: 2.0000 | INHALATION_SPRAY | Freq: Two times a day (BID) | RESPIRATORY_TRACT | Status: DC

## 2013-10-28 NOTE — Patient Instructions (Signed)
Qvar two puffs twice per day, and rinse mouth after each use Proair two puffs up to four times per day as needed for cough, wheeze, or chest congestion Follow up in 3 months

## 2013-10-28 NOTE — Progress Notes (Signed)
Chief Complaint  Patient presents with  . Asthma    Breathing is unchanged. Having to use resuce inhaler 2-3 times per week. Had PFT done today.    History of Present Illness: Donna Willis is a 52 y.o. female never smoker with chronic cough.  She has prior hx of histoplasmosis.  She still has a cough.  This can happen at night.  She is using her rescue inhaler several times per week >> this helps some.  She denies sinus congestion, post nasal drip, or reflux.    She had PFT's earlier today.  This showed borderline bronchodilator responsive based on change in FEF 25-75%, but otherwise was normal   Tests: CXR 06/29/12 >> calcified hilar nodes, scattered calcified granuloms in lungs PFT 10/28/13 >> FEV1 2.65 (95%), FEV1% 84, TLC 4.65 (92%), TLC 127%, positive BD from FEF 25-75  Donna Willis  has a past medical history of GERD (gastroesophageal reflux disease); Hernia; Interstitial cystitis; Hay fever; Atypical chest pain (08/2012); Adenomatous colon polyp (10/22/12); Personal history of  adenomatous colonic polyp (10/28/2012); Mild intermittent asthma in adult without complication (08/31/2013); and History of histoplasmosis (08/31/2013).  Donna Willis  has past surgical history that includes Cholecystectomy (2002); Cesarean section (1988); Tonsillectomy and adenoidectomy (1974); Hernia repair (2003); Umbilical hernia repair (08/19/2012); Cardiovascular stress test (09/2012); and Colonoscopy w/ polypectomy (10/22/12).  Prior to Admission medications   Medication Sig Start Date End Date Taking? Authorizing Provider  ibuprofen (ADVIL,MOTRIN) 200 MG tablet Take 400 mg by mouth every 6 (six) hours as needed. pain   Yes Historical Provider, MD  Omega-3 Fatty Acids (FISH OIL PO) Take 1 capsule by mouth daily.   Yes Historical Provider, MD  omeprazole (PRILOSEC) 20 MG capsule Take 20 mg by mouth daily. 06/29/12 08/31/14 Yes Collene Gobble, MD    Allergies  Allergen Reactions  . Ciprofloxacin  Itching  . Penicillins Itching    Her family history includes Colon polyps in her mother; Diverticulitis in her father; Other in her maternal uncle. There is no history of Rectal cancer.  She  reports that she has never smoked. She has never used smokeless tobacco. She reports that she drinks alcohol. She reports that she does not use illicit drugs.  Physical Exam:  General - No distress ENT - No sinus tenderness, no oral exudate, no LAN Cardiac - s1s2 regular, no murmur Chest - No wheeze/rales/dullness Back - No focal tenderness Abd - Soft, non-tender Ext - No edema Neuro - Normal strength Skin - No rashes Psych - Normal mood, and behavior  Assessment/Plan:  Coralyn Helling, MD [place] [facility] Pager:  [phone]

## 2013-10-28 NOTE — Progress Notes (Signed)
PFT done today. 

## 2013-11-05 NOTE — Assessment & Plan Note (Signed)
Likely related to asthma.

## 2013-11-05 NOTE — Assessment & Plan Note (Signed)
Not much of an issue at present.  She can use prn OTC medications if needed.  She does not need additional allergy testing at present. 

## 2013-11-05 NOTE — Assessment & Plan Note (Signed)
She has borderline bronchodilator responsiveness on PFT.  She reports clinical improvement after use of short acting bronchodilators, but uses these several times per week.  She reports nocturnal symptoms also.  Will add Qvar to her inhaler regimen and continue as needed proair.

## 2013-11-08 ENCOUNTER — Encounter: Payer: Self-pay | Admitting: Family Medicine

## 2013-11-20 ENCOUNTER — Telehealth: Payer: Self-pay | Admitting: Pulmonary Disease

## 2013-11-20 NOTE — Telephone Encounter (Signed)
lmtcb x1 

## 2013-11-20 NOTE — Telephone Encounter (Signed)
PFT 10/28/13 >> FEV1 2.65 (95%), FEV1% 84, TLC 4.65 (92%), DLCO 127%, +BD from FEF 25-75  Will have my nurse call to f/u with patient about how she is doing since using Qvar.

## 2013-11-23 NOTE — Telephone Encounter (Signed)
lmtcb x2 

## 2013-11-24 MED ORDER — BECLOMETHASONE DIPROPIONATE 80 MCG/ACT IN AERS: 2.0000 | INHALATION_SPRAY | Freq: Two times a day (BID) | RESPIRATORY_TRACT | Status: DC

## 2013-11-24 NOTE — Telephone Encounter (Signed)
Spoke to pt and she states that she is doing much better since starting Qvar. Has not had to use her rescue inhaler as much. Would like a prescription sent to her pharmacy. This has been taken care of.

## 2014-02-16 ENCOUNTER — Telehealth: Payer: Self-pay | Admitting: Pulmonary Disease

## 2014-02-16 NOTE — Telephone Encounter (Signed)
Spoke with pt. appt scheduled to see TP. Nothing further needed

## 2014-02-17 ENCOUNTER — Telehealth: Payer: Self-pay | Admitting: Family Medicine

## 2014-02-17 ENCOUNTER — Ambulatory Visit (HOSPITAL_BASED_OUTPATIENT_CLINIC_OR_DEPARTMENT_OTHER)
Admission: RE | Admit: 2014-02-17 | Discharge: 2014-02-17 | Disposition: A | Discharge status: Home / Self Care | Payer: 59 | Source: Ambulatory Visit | Attending: Family Medicine | Admitting: Family Medicine

## 2014-02-17 ENCOUNTER — Ambulatory Visit (INDEPENDENT_AMBULATORY_CARE_PROVIDER_SITE_OTHER): Payer: 59 | Admitting: Family Medicine

## 2014-02-17 ENCOUNTER — Encounter: Payer: Self-pay | Admitting: Family Medicine

## 2014-02-17 VITALS — BP 134/86 | HR 86 | Temp 97.6°F | Resp 18 | Ht 64.0 in | Wt 249.0 lb

## 2014-02-17 DIAGNOSIS — M79672 Pain in left foot: Secondary | ICD-10-CM

## 2014-02-17 DIAGNOSIS — M79609 Pain in unspecified limb: Secondary | ICD-10-CM | POA: Insufficient documentation

## 2014-02-17 NOTE — Progress Notes (Addendum)
OFFICE NOTE  02/17/2014  CC:  Chief Complaint  Patient presents with  . Foot Pain    left foot pain, ball of foot, last night     HPI: Patient is a 53 y.o. Caucasian female who is here for left foot pain.  No trauma to foot. Onset 1-2 wks ago pain in outside and bottom/heel area of left foot, pain was mild initially. Onset last night of severe pain with wt bearing when she got up out of bed to use the bathroom, and the pain involved essentially the remainder of the foot.  At rest and with no wt on foot-- no pain. Slightly swollen to pt's eye but no redness or warmth noted. Ibup 600 mg about 6 hours ago helped some.  She walks in with the assistance of crutches today.    Pertinent PMH:  Past medical, surgical, social hx reviewed. MEDS:  Outpatient Prescriptions Prior to Visit  Medication Sig Dispense Refill  . albuterol (PROAIR HFA) 108 (90 BASE) MCG/ACT inhaler Inhale 2 puffs into the lungs every 6 (six) hours as needed for wheezing.  1 Inhaler  3  . beclomethasone (QVAR) 80 MCG/ACT inhaler Inhale 2 puffs into the lungs 2 (two) times daily.  1 Inhaler  6  . ibuprofen (ADVIL,MOTRIN) 200 MG tablet Take 400 mg by mouth every 6 (six) hours as needed. pain      . Omega-3 Fatty Acids (FISH OIL PO) Take 1 capsule by mouth daily.      Marland Kitchen omeprazole (PRILOSEC) 20 MG capsule Take 20 mg by mouth daily.       No facility-administered medications prior to visit.    PE: Blood pressure 134/86, pulse 86, temperature 97.6 F (36.4 C), temperature source Temporal, resp. rate 18, height 5\' 4"  (1.626 m), weight 249 lb (112.946 kg), last menstrual period 01/29/2014, SpO2 96.00%. Gen: Alert, well appearing.  Patient is oriented to person, place, time, and situation. LEFT FOOT: Minimal, if any, swelling on lateral aspect of left foot, but no erythema or deformity or warmth to touch.  PT/DP pulses 2+. She has focal TTP in medial aspect of her arch but not as much tenderness at the area of insertion of  plantar fascia on the heel.  No tenderness over the heel.  MTP joints, ankle, and toes all without tenderness.  She can move the ankle and foot normally without any resistance against it.  When she steps on her foot she winces, must use crutches to walk.  IMPRESSION AND PLAN:  Plantar foot pain w/out hx of any trauma-- suspect severe flare of plantar fasciitis; but she is not completely fitting with this dx in presentation and exam findings.  Will check foot x-ray today. Considering plantar fascia injection if x-ray unremarkable.   Low suspicion for gouty arthritis, will pursue no w/u for this dx at the present time.  FOLLOW UP: To be determined based on results of pending workup.   Addendum 02/17/14:  Foot x-ray normal.  PROCEDURE: Consent obtained. Pt positioned in prone position on exam table.  Used medial approach with 25g 1 and 1/2 inch needle to inject 53ml 40mg /ml depo medrol and 3 ml of 1% lidocaine w/out epi.   This was injected into the area of most tenderness near the medial tubercle of calcaneus.  Pt tolerated procedure well.  No immediate complications. Post-injection instructions given to pt: minimal wt bearing x 24h, then may ambulate wearing 1/4 inch heel insert in shoe.  Start plantar fascia stretching exercises in  2d--handout reviewed and given to patient.

## 2014-02-17 NOTE — Addendum Note (Signed)
Addended by: Tammi Sou on: 02/17/2014 04:34 PM   Modules accepted: Level of Service

## 2014-02-17 NOTE — Telephone Encounter (Signed)
Patient Information:  Caller Name: Rhyleigh  Phone: [phone]  Patient: Donna Willis, Donna Willis  Gender: Female  DOB: 06/28/1961  Age: 53 Years  PCP: Ricardo Jericho [facility])  Pregnant: No  Office Follow Up:  Does the office need to follow up with this patient?: No  Instructions For The Office: N/A   Symptoms  Reason For Call & Symptoms: L foot hurting with movement and weight bearing onset 02/16/14. Outer side of foot/ ankle swollen. No redness or warmth, but has slight purplish bruise on top of foot. Foot hurting all week, no injuries.  Hx of Umbilical Hernia repair 2 years ago and and has sore spot on L side of navel- worse with movement- onset 02/13/14. Thinks is area where had laparoscopic incision.   Reviewed Health History In EMR: Yes  Reviewed Medications In EMR: Yes  Reviewed Allergies In EMR: Yes  Reviewed Surgeries / Procedures: Yes  Date of Onset of Symptoms: 02/10/2014  Treatments Tried: Ibuprofen , Ice pack  Treatments Tried Worked: No OB / GYN:  LMP: 01/27/2014  Guideline(s) Used:  Foot Pain  Disposition Per Guideline:   See Today in Office  Reason For Disposition Reached:   Severe pain (e.g., excruciating, unable to do any normal activities)  Advice Given:  Reassurance - Foot Pain  : The symptoms you describe do not sound serious. You have told me that there is no redness, swelling, or fever. You have also told me that there has been no recent major injury.  Reassurance - Overuse  Foot pain and soreness are very common following vigorous activity. Such activities include sports like tennis and basketball, jogging, and certain types of work (lots of walking).  Aggravating Factors   Overuse: People that are in professions that require prolonged standing and walking commonly report foot pain.  Shoes: Poorly fitting or overly tight shoes are the underlying cause of many painful foot conditions. High heels are bad for feet.  Pain Medicines:  Ibuprofen (e.g., Motrin,  Advil):  Take 400 mg (two 200 mg pills) by mouth every 6 hours.  Another choice is to take 600 mg (three 200 mg pills) by mouth every 8 hours.  Call Back If:  Swelling, redness, or fever occur  Severe pain not relieved by pain medication  Pain lasts over 7 days  You become worse.  Patient Will Follow Care Advice:  YES  Appointment Scheduled:  02/17/2014 13:30:00 Appointment Scheduled Provider:  Ricardo Jericho [facility])

## 2014-02-17 NOTE — Patient Instructions (Signed)
Take 600 mg ibuprofen three times a day with food. Buy a 1/4 inch heel lift for left shoe and wear at all times until foot no longer hurts. Do your stretching exercises 3 times per day starting 02/19/14.

## 2014-02-18 ENCOUNTER — Ambulatory Visit (INDEPENDENT_AMBULATORY_CARE_PROVIDER_SITE_OTHER): Payer: 59 | Admitting: Adult Health

## 2014-02-18 ENCOUNTER — Encounter: Payer: Self-pay | Admitting: Adult Health

## 2014-02-18 VITALS — BP 114/80 | HR 81 | Temp 97.9°F | Ht 65.0 in | Wt 252.0 lb

## 2014-02-18 DIAGNOSIS — J4520 Mild intermittent asthma, uncomplicated: Secondary | ICD-10-CM

## 2014-02-18 DIAGNOSIS — J45909 Unspecified asthma, uncomplicated: Secondary | ICD-10-CM

## 2014-02-18 MED ORDER — PREDNISONE 10 MG PO TABS: ORAL_TABLET | ORAL | Status: DC

## 2014-02-18 NOTE — Progress Notes (Signed)
   Subjective:    Patient ID: Donna Willis, female    DOB: 1961/09/23, 53 y.o.   MRN: [id number]  HPI 53 yo never smoker with chronic cough.  She has prior hx of histoplasmosis.  Tests: CXR 06/29/12 >> calcified hilar nodes, scattered calcified granuloms in lungs PFT 10/28/13 >> FEV1 2.65 (95%), FEV1% 84, TLC 4.65 (92%), TLC 127%, positive BD from FEF 25-75  02/18/2014 Acute OV  Complains of increased SOB, cough that is mostly dry with rare thick white mucus, occasional wheezing, increased SABA use x 5 days.  Denies any f/c/s, hemoptysis, discolored mucus, hemoptysis, chest tightness, nausea, vomiting.  Started on [employer] . Feels that the high pollen count is contributing  Feels that smells trigger her cough/wheezing -mulch, rubber , etc.    Review of Systems Constitutional:   No  weight loss, night sweats,  Fevers, chills, fatigue, or  lassitude.  HEENT:   No headaches,  Difficulty swallowing,  Tooth/dental problems, or  Sore throat,                No sneezing, itching, ear ache,  +nasal congestion, post nasal drip,   CV:  No chest pain,  Orthopnea, PND, swelling in lower extremities, anasarca, dizziness, palpitations, syncope.   GI  No heartburn, indigestion, abdominal pain, nausea, vomiting, diarrhea, change in bowel habits, loss of appetite, bloody stools.   Resp:    No chest wall deformity  Skin: no rash or lesions.  GU: no dysuria, change in color of urine, no urgency or frequency.  No flank pain, no hematuria   MS:  No joint pain or swelling.  No decreased range of motion.  No back pain.  Psych:  No change in mood or affect. No depression or anxiety.  No memory loss.         Objective:   Physical Exam GEN: A/Ox3; pleasant , NAD, well nourished   HEENT:  [place]/AT,  EACs-clear, TMs-wnl, NOSE-clear drainage, THROAT-clear, no lesions, no postnasal drip or exudate noted.   NECK:  Supple w/ fair ROM; no JVD; normal carotid impulses w/o bruits; no thyromegaly or nodules  palpated; no lymphadenopathy.  RESP  Faint exp wheeze on forced exp , no accessory muscle use, no dullness to percussion  CARD:  RRR, no m/r/g  , no peripheral edema, pulses intact, no cyanosis or clubbing.  GI:   Soft & nt; nml bowel sounds; no organomegaly or masses detected.  Musco: Warm bil, no deformities or joint swelling noted.   Neuro: alert, no focal deficits noted.    Skin: Warm, no lesions or rashes         Assessment & Plan:

## 2014-02-18 NOTE — Assessment & Plan Note (Signed)
Exacerbation w/ AR trigger  Plan  Prednisone taper over next week.  Continue with allegra daily As needed  Drainage.  Saline nasal rinses As needed   Delsym 2 tsp Twice daily  As needed  Cough .  Albuterol 2 puffs every 4hrs as needed for wheezing.  Please contact office for sooner follow up if symptoms do not improve or worsen or seek emergency care  Follow up Dr. Halford Chessman  As planned and As needed

## 2014-02-18 NOTE — Progress Notes (Signed)
Reviewed and agree with assessment/plan. 

## 2014-02-18 NOTE — Patient Instructions (Signed)
Prednisone taper over next week.  Continue with allegra daily As needed  Drainage.  Saline nasal rinses As needed   Delsym 2 tsp Twice daily  As needed  Cough .  Albuterol 2 puffs every 4hrs as needed for wheezing.  Please contact office for sooner follow up if symptoms do not improve or worsen or seek emergency care  Follow up Dr. Halford Chessman  As planned and As needed

## 2014-06-11 LAB — PULMONARY FUNCTION TEST
DL/VA % pred: 136 %
DL/VA: 6.55 ml/min/mmHg/L
DLCO unc % pred: 127 %
DLCO unc: 31.01 ml/min/mmHg
FEF 25-75 Post: 3.28 L/sec
FEF 25-75 Pre: 2.39 L/sec
FEF2575-%Change-Post: 37 %
FEF2575-%Pred-Post: 122 %
FEF2575-%Pred-Pre: 88 %
FEV1-%Change-Post: 8 %
FEV1-%Pred-Post: 95 %
FEV1-%Pred-Pre: 88 %
FEV1-Post: 2.65 L
FEV1-Pre: 2.43 L
FEV1FVC-%Change-Post: 4 %
FEV1FVC-%Pred-Pre: 100 %
FEV6-%Change-Post: 5 %
FEV6-%Pred-Post: 92 %
FEV6-%Pred-Pre: 86 %
FEV6-Post: 3.14 L
FEV6-Pre: 2.96 L
FEV6FVC-%Pred-Post: 103 %
FEV6FVC-%Pred-Pre: 103 %
FVC-%Change-Post: 4 %
FVC-%Pred-Post: 89 %
FVC-%Pred-Pre: 86 %
FVC-Post: 3.14 L
FVC-Pre: 3.02 L
Post FEV1/FVC ratio: 84 %
Post FEV6/FVC ratio: 100 %
Pre FEV1/FVC ratio: 81 %
Pre FEV6/FVC Ratio: 100 %
RV % pred: 63 %
RV: 1.16 L
TLC % pred: 92 %
TLC: 4.65 L

## 2014-06-14 ENCOUNTER — Ambulatory Visit (INDEPENDENT_AMBULATORY_CARE_PROVIDER_SITE_OTHER): Payer: 59 | Admitting: Family Medicine

## 2014-06-14 ENCOUNTER — Encounter: Payer: Self-pay | Admitting: Family Medicine

## 2014-06-14 VITALS — BP 130/86 | HR 74 | Temp 97.6°F | Resp 18 | Ht 64.0 in | Wt 250.0 lb

## 2014-06-14 DIAGNOSIS — R599 Enlarged lymph nodes, unspecified: Secondary | ICD-10-CM

## 2014-06-14 DIAGNOSIS — R590 Localized enlarged lymph nodes: Secondary | ICD-10-CM

## 2014-06-14 DIAGNOSIS — L0293 Carbuncle, unspecified: Secondary | ICD-10-CM

## 2014-06-14 DIAGNOSIS — L0292 Furuncle, unspecified: Secondary | ICD-10-CM

## 2014-06-14 MED ORDER — MUPIROCIN 2 % EX OINT: TOPICAL_OINTMENT | CUTANEOUS | Status: DC

## 2014-06-14 MED ORDER — SULFAMETHOXAZOLE-TMP DS 800-160 MG PO TABS: ORAL_TABLET | ORAL | Status: DC

## 2014-06-14 NOTE — Progress Notes (Signed)
OFFICE NOTE  06/14/2014  CC:  Chief Complaint  Patient presents with  . Otalgia    L ear   . Neck Pain     HPI: Patient is a 53 y.o. Caucasian female who is here for ear pain. Onset 3 nights ago, left outer ear (ear lobe) pain, also some under ear on lateral neck. No fever.  Advil helps some with the pain.  Recalls a bump or two on ear that she scratched lately. Has had an adult cat x 6 mo and he is a "biter" but she doesn't distinctly recall any bite to ear region.  Pertinent PMH:  Past medical, surgical, social, and family history reviewed and no changes are noted since last office visit.  MEDS: Not on prednisone or prilosec listed below Outpatient Prescriptions Prior to Visit  Medication Sig Dispense Refill  . albuterol (PROAIR HFA) 108 (90 BASE) MCG/ACT inhaler Inhale 2 puffs into the lungs every 6 (six) hours as needed for wheezing.  1 Inhaler  3  . beclomethasone (QVAR) 80 MCG/ACT inhaler Inhale 2 puffs into the lungs 2 (two) times daily.  1 Inhaler  6  . ibuprofen (ADVIL,MOTRIN) 200 MG tablet Take 400 mg by mouth every 6 (six) hours as needed. pain      . fexofenadine (ALLEGRA) 180 MG tablet Take 180 mg by mouth daily.      . Omega-3 Fatty Acids (FISH OIL PO) Take 1 capsule by mouth daily.      Marland Kitchen omeprazole (PRILOSEC) 20 MG capsule Take 20 mg by mouth daily.      . predniSONE (DELTASONE) 10 MG tablet 4 tabs for 2 days, then 3 tabs for 2 days, 2 tabs for 2 days, then 1 tab for 2 days, then stop  20 tablet  0   No facility-administered medications prior to visit.    PE: Blood pressure 130/86, pulse 74, temperature 97.6 F (36.4 C), temperature source Temporal, resp. rate 18, height 5\' 4"  (1.626 m), weight 250 lb (113.399 kg), SpO2 97.00%. Gen: Alert, well appearing.  Patient is oriented to person, place, time, and situation. ENT: Ears: EACs clear, normal epithelium.  TMs with good light reflex and landmarks bilaterally.   Left ear lobe with mild erythema and 4-5 tiny  punctate ulcerations with pink edges.  No signif TTP.  I cannot appreciate any fluid filled pockets when I squeeze the ear lobe.  Left lateral region of neck is mildly TTP in soft tissues but I cannot palpate any distinct lymph nodes or other mass.  No skin erythema or warmth on neck. Eyes: no injection, icteris, swelling, or exudate.  EOMI, PERRLA. Nose: no drainage or turbinate edema/swelling.  No injection or focal lesion.  Mouth: lips without lesion/swelling.  Oral mucosa pink and moist.  Dentition intact and without obvious caries or gingival swelling.  Oropharynx without erythema, exudate, or swelling.    IMPRESSION AND PLAN:  Left earlobe furuncles, appear to be drained.  Possibly some reactive LAD on left lat neck region but nothing palpable. Rx'd bactrim DS 1 tab bid x 7d. Bactroban ointment 2% tid to affected area x 10d. Heat to area 30 min bid. Signs/symptoms to call or return for were reviewed and pt expressed understanding.  An After Visit Summary was printed and given to the patient.  FOLLOW UP: prn

## 2014-06-14 NOTE — Progress Notes (Signed)
Pre visit review using our clinic review tool, if applicable. No additional management support is needed unless otherwise documented below in the visit note. 

## 2014-08-09 ENCOUNTER — Telehealth: Payer: Self-pay | Admitting: Pulmonary Disease

## 2014-08-09 MED ORDER — BECLOMETHASONE DIPROPIONATE 80 MCG/ACT IN AERS: 2.0000 | INHALATION_SPRAY | Freq: Two times a day (BID) | RESPIRATORY_TRACT | Status: DC

## 2014-08-09 NOTE — Telephone Encounter (Signed)
Called pt. Aware RX sent in. nothing further needed

## 2014-08-11 ENCOUNTER — Encounter: Payer: Self-pay | Admitting: Pulmonary Disease

## 2014-08-11 ENCOUNTER — Ambulatory Visit (INDEPENDENT_AMBULATORY_CARE_PROVIDER_SITE_OTHER): Payer: 59 | Admitting: Pulmonary Disease

## 2014-08-11 VITALS — BP 134/82 | HR 69 | Temp 98.2°F | Ht 64.75 in | Wt 241.0 lb

## 2014-08-11 DIAGNOSIS — B370 Candidal stomatitis: Secondary | ICD-10-CM

## 2014-08-11 DIAGNOSIS — J4531 Mild persistent asthma with (acute) exacerbation: Secondary | ICD-10-CM

## 2014-08-11 DIAGNOSIS — J45909 Unspecified asthma, uncomplicated: Secondary | ICD-10-CM | POA: Insufficient documentation

## 2014-08-11 DIAGNOSIS — J45901 Unspecified asthma with (acute) exacerbation: Secondary | ICD-10-CM

## 2014-08-11 MED ORDER — NYSTATIN 100000 UNIT/ML MT SUSP: 500000.0000 [IU] | Freq: Four times a day (QID) | OROMUCOSAL | Status: DC

## 2014-08-11 MED ORDER — PREDNISONE 10 MG PO TABS: ORAL_TABLET | ORAL | Status: DC

## 2014-08-11 MED ORDER — BUDESONIDE-FORMOTEROL FUMARATE 160-4.5 MCG/ACT IN AERO: 2.0000 | INHALATION_SPRAY | Freq: Two times a day (BID) | RESPIRATORY_TRACT | Status: DC

## 2014-08-11 MED ORDER — BECLOMETHASONE DIPROPIONATE 80 MCG/ACT IN AERS: 2.0000 | INHALATION_SPRAY | Freq: Two times a day (BID) | RESPIRATORY_TRACT | Status: DC

## 2014-08-11 NOTE — Progress Notes (Signed)
Chief Complaint  Patient presents with  . Follow-up    Pt reports that cough has flared up again x 3 weeks. Mucus production at times thick and white in color. Pt needs refill of QVAR.     History of Present Illness: Donna Willis is a 53 y.o. female never smoker with chronic cough.  She has prior hx of histoplasmosis.  She was cleaning out some old boxes of letters.  She was exposed to a lot of dust.  This happened about 3 weeks ago.  Since then she has noticed more cough and chest congestion.  She was bringing up yellow sputum, but now more clear.  Her cough is usually dry.  She gets some wheezing.  She denies sinus congestion, fever, headache, or skin rash.  She has been using Qvar.  She has been using proair bid for past 2 weeks >> this helps but doesn't last.   Tests: CXR 06/29/12 >> calcified hilar nodes, scattered calcified granuloms in lungs PFT 10/28/13 >> FEV1 2.65 (95%), FEV1% 84, TLC 4.65 (92%), TLC 127%, positive BD from FEF 25-75  PMHx, PSHx, Medications, Allergies, Fhx, Shx reviewed.  Physical Exam:  General - No distress ENT - No sinus tenderness, white exudate posterior pharynx Cardiac - s1s2 regular, no murmur Chest - No wheeze/rales/dullness Back - No focal tenderness Abd - Soft, non-tender Ext - No edema Neuro - Normal strength Skin - No rashes Psych - Normal mood, and behavior  Assessment/Plan:  Chesley Mires, MD [place] [facility]/Sleep Pager:  [phone]

## 2014-08-11 NOTE — Assessment & Plan Note (Signed)
Will have her use nystatin swish and swallow for 5 days.  Discussed option of using spacer device with her inhalers >> she would like to defer this for now.

## 2014-08-11 NOTE — Patient Instructions (Signed)
Nystatin 5 ml swish and swallow 4 times daily for 5 days Symbicort two puffs twice per day until sample is done >> rinse mouth after each use Prednisone 10 mg pill >> 2 pills for 2 days, 1 pill for 2 days, 1/2 pill for 2 days Resume Qvar once symbicort sample is finished Call if not felling better  Follow up in 6 months

## 2014-08-11 NOTE — Assessment & Plan Note (Signed)
She has exacerbation related to dust exposure.  I don't think she has bacterial infection, and will defer Abx and CXR for now.  Will give her course of prednisone.  Will have her use symbicort bid until sample is completed, and then resume Qvar.  She can continue prn proair.  She is to call if not improved.

## 2014-12-10 ENCOUNTER — Other Ambulatory Visit: Payer: Self-pay | Admitting: Pulmonary Disease

## 2014-12-28 ENCOUNTER — Encounter: Payer: Self-pay | Admitting: Sports Medicine

## 2014-12-28 ENCOUNTER — Ambulatory Visit (INDEPENDENT_AMBULATORY_CARE_PROVIDER_SITE_OTHER): Payer: 59 | Admitting: Sports Medicine

## 2014-12-28 VITALS — BP 141/84 | Ht 65.0 in | Wt 234.0 lb

## 2014-12-28 DIAGNOSIS — M25521 Pain in right elbow: Secondary | ICD-10-CM | POA: Insufficient documentation

## 2014-12-28 MED ORDER — PREDNISONE (PAK) 10 MG PO TABS: ORAL_TABLET | ORAL | Status: DC

## 2014-12-28 NOTE — Assessment & Plan Note (Signed)
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 -Treatment options were discussed today. -She will try a Medrol dose pack for 6 days to see if this can calm down the inflammation. -We fitted and applied a right elbow body helix compression sleeve, which she says helps. -She will rest, ice, and elevate the elbow. -If her symptoms persist, we would likely obtain x-rays of the right elbow and consider a corticosteroid injection in the right elbow joint. -She'll follow-up in 4-6 weeks or sooner if needed.

## 2014-12-28 NOTE — Progress Notes (Signed)
   Subjective:    Patient ID: Donna Willis, female    DOB: 1961-11-12, 54 y.o.   MRN: [id number]  HPI Donna Willis is a 54 year old right-hand-dominant female who presents with right elbow pain. Onset of symptoms was approximately one month ago without any known acute injury. She does recall that she was helping someone move, when her right elbow began hurting. Location of pain is primarily posterior lateral elbow. Symptoms are aggravated with opening a bottle or with lifting. Character is a dull ache. She tried taking ibuprofen with little relief. She denies any numbness, tingling, or weakness.  Past medical history, social history, medications, and allergies were reviewed and are up to date in the chart.  Review of Systems 7 point review of systems was performed and was otherwise negative unless noted in the history of present illness.     Objective:   Physical Exam BP 141/84 mmHg  Ht 5\' 5"  (1.651 m)  Wt 234 lb (106.142 kg)  BMI 38.94 kg/m2 GEN: The patient is well-developed well-nourished female and in no acute distress.  She is awake alert and oriented x3. SKIN: warm and well-perfused, no rash  Neuro: Strength 5/5 globally. Sensation intact throughout. No focal deficits. Vasc: +2 bilateral distal pulses. No edema.  MSK: Examination of the right elbow reveals range of motion from 0-120 with mild pain at the endpoint of motion with terminal extension. Point of maximal tenderness is elicited over the lateral olecranon joint line posteriorly. Pain is not reproduced with resisted supination or pronation. Minimal tenderness at the medial and lateral epicondyles. Strength is intact. Triceps strength is good.  Limited musculoskeletal ultrasound: Long and short axis views were obtained of the right elbow. The triceps tendon appears intact. There appears to be a increase in the joint fluid of the right elbow compared to the left. She has a small osteophyte located at the distal lateral humerus  impinging at the olecranon with surrounding joint fluid. Arthritic changes are seen throughout. The medial and lateral common flexor and extensor tendons appear intact.     Assessment & Plan:  Please see problem based assessment and plan in the problem list.

## 2015-01-20 ENCOUNTER — Ambulatory Visit (INDEPENDENT_AMBULATORY_CARE_PROVIDER_SITE_OTHER): Payer: 59 | Admitting: Sports Medicine

## 2015-01-20 ENCOUNTER — Encounter: Payer: Self-pay | Admitting: Sports Medicine

## 2015-01-20 VITALS — BP 130/84 | Ht 65.0 in | Wt 234.0 lb

## 2015-01-20 DIAGNOSIS — M7711 Lateral epicondylitis, right elbow: Secondary | ICD-10-CM

## 2015-01-20 DIAGNOSIS — M25521 Pain in right elbow: Secondary | ICD-10-CM

## 2015-01-20 MED ORDER — NITROGLYCERIN 0.2 MG/HR TD PT24
MEDICATED_PATCH | TRANSDERMAL | Status: DC
Start: 2015-01-20 — End: 2015-10-12

## 2015-01-20 NOTE — Patient Instructions (Signed)

## 2015-01-21 DIAGNOSIS — M7711 Lateral epicondylitis, right elbow: Secondary | ICD-10-CM | POA: Insufficient documentation

## 2015-01-21 NOTE — Progress Notes (Signed)
   Subjective:    Patient ID: Donna Willis, female    DOB: October 28, 1961, 54 y.o.   MRN: [id number]  HPI Donna Willis is a 54 year old right-hand-dominant female who presents with right elbow pain. Onset of symptoms for 2-3 month without any known acute injury. Last seen 4 week ago and treated for elbow DJD with a prednisone dose pak with some improvement and a elbow compression sleeve.  Location of pain is primarily posterior lateral elbow. Symptoms are aggravated with opening a bottle or with lifting and wrist extension. Character is a dull ache. She has also been using ibuprofen once she finished prednisone. She denies any numbness, tingling, or weakness.  Past medical history, social history, medications, and allergies were reviewed and are up to date in the chart.  Review of Systems 7 point review of systems was performed and was otherwise negative unless noted in the history of present illness.     Objective:   Physical Exam BP 130/84 mmHg  Ht 5\' 5"  (1.651 m)  Wt 234 lb (106.142 kg)  BMI 38.94 kg/m2 GEN: The patient is well-developed well-nourished female and in no acute distress.  She is awake alert and oriented x3. SKIN: warm and well-perfused, no rash  Neuro: Strength 5/5 globally. Sensation intact throughout. No focal deficits. Vasc: +2 bilateral distal pulses. No edema.  MSK: Examination of the right elbow reveals range of motion from 0-120 with mild pain at the endpoint of motion with terminal extension. Point of maximal tenderness is elicited over the lateral joint line and lateral epicondyle. Pain with reproduced with resisted pronation and wrist extension. Strength is intact. Triceps strength is good.  Limited musculoskeletal ultrasound: Long and short axis views were obtained of the right elbow.  Hypoechoic splits in the lateral epicondyle common extensor insertion  Small osteophyte located at the distal lateral humerus impinging at the olecranon with surrounding joint fluid.  Arthritic changes are seen throughout.     Assessment & Plan:  Impression: Lateral epicondylitis of right elbow  Recommendations: -discuss treatment options of continue conservative management with treatment of NTG protocol, continue NSAIDs, cortisone injection, and therapeutic exercise -Patient will start on NTG protocol with f/u and repeat US in 5-6 weeks -Provided HEP for epicondylitis - Injection if pain continues or worsens

## 2015-02-01 ENCOUNTER — Ambulatory Visit: Payer: 59 | Admitting: Sports Medicine

## 2015-03-28 ENCOUNTER — Telehealth: Payer: Self-pay

## 2015-03-28 NOTE — Telephone Encounter (Signed)
LMOVM asking patient to CB 

## 2015-03-29 NOTE — Telephone Encounter (Signed)
No call back, My Chart Message Sent

## 2015-04-06 ENCOUNTER — Encounter: Payer: Self-pay | Admitting: Family Medicine

## 2015-04-22 ENCOUNTER — Telehealth: Payer: Self-pay | Admitting: Pulmonary Disease

## 2015-04-22 MED ORDER — BECLOMETHASONE DIPROPIONATE 80 MCG/ACT IN AERS: INHALATION_SPRAY | RESPIRATORY_TRACT | Status: DC

## 2015-04-22 NOTE — Telephone Encounter (Signed)
Pt requesting refill on QVAR. rx sent to pharmacy. Nothing further needed at this time.

## 2015-05-20 ENCOUNTER — Encounter: Payer: Self-pay | Admitting: Pulmonary Disease

## 2015-05-20 ENCOUNTER — Ambulatory Visit (INDEPENDENT_AMBULATORY_CARE_PROVIDER_SITE_OTHER): Payer: 59 | Admitting: Pulmonary Disease

## 2015-05-20 VITALS — BP 130/72 | HR 66 | Temp 97.0°F | Ht 65.0 in | Wt 236.8 lb

## 2015-05-20 DIAGNOSIS — J4530 Mild persistent asthma, uncomplicated: Secondary | ICD-10-CM

## 2015-05-20 DIAGNOSIS — W5501XA Bitten by cat, initial encounter: Secondary | ICD-10-CM

## 2015-05-20 MED ORDER — DOXYCYCLINE HYCLATE 100 MG PO TABS: 100.0000 mg | ORAL_TABLET | Freq: Two times a day (BID) | ORAL | Status: DC

## 2015-05-20 MED ORDER — BECLOMETHASONE DIPROPIONATE 80 MCG/ACT IN AERS: 2.0000 | INHALATION_SPRAY | Freq: Every day | RESPIRATORY_TRACT | Status: DC

## 2015-05-20 NOTE — Progress Notes (Signed)
Chief Complaint  Patient presents with  . Follow-up    Pt reports breathing has been fair. Has prod cough in AM's (white phlem). No wheezing, no chest tx.     History of Present Illness: Donna Willis is a 54 y.o. female never smoker with chronic cough.  She has prior hx of histoplasmosis.  Her asthma has been well controlled.  She has not needed to use proair.  She is not having cough, wheeze, or sputum.  This Spring was the first time in a while she can remember not having issues with her breathing.  She denies sinus congestion, ear pain, or sore throat.  She has not had issues with thrush.  She sometimes forgets to take her PM dose of Qvar, but does not notice any worsening of her breathing.  She has a International aid/development worker.  She was bit by the cat in her left lower leg.  This has been warm, swollen, and painful.  She has been putting on topical antibiotic, but not helping.  She was bit yesterday.  She is not having fever.   Tests: CXR 06/29/12 >> calcified hilar nodes, scattered calcified granuloms in lungs PFT 10/28/13 >> FEV1 2.65 (95%), FEV1% 84, TLC 4.65 (92%), TLC 127%, positive BD from FEF 25-75  PMHx >> GERD, Cystitis  PSHx, Medications, Allergies, Fhx, Shx reviewed.  Physical Exam: BP 130/72 mmHg  Pulse 66  Temp(Src) 97 F (36.1 C) (Oral)  Ht 5\' 5"  (1.651 m)  Wt 236 lb 12.8 oz (107.412 kg)  BMI 39.41 kg/m2  SpO2 97%  General - No distress ENT - No sinus tenderness,no oral exudate, no LAN Cardiac - s1s2 regular, no murmur Chest - No wheeze/rales/dullness Back - No focal tenderness Abd - Soft, non-tender Ext - No edema Neuro - Normal strength Skin - pustule with surrounding erythema anterior aspect Lt lower leg Psych - Normal mood, and behavior  Assessment/Plan:  Mild, persistent asthma. Plan: - she can try decrease her Qvar to two puffs in the morning only  - continue prn proair  Cat bite. She has allergy to PCN and Cipro. Plan: - will give her course of  doxycycline   Chesley Mires, MD [facility] Pager:  [phone]

## 2015-05-20 NOTE — Patient Instructions (Signed)
Try changing Qvar to two puffs in the morning Doxycycline 100 mg twice per day Follow up in 1 year

## 2015-08-10 ENCOUNTER — Other Ambulatory Visit: Payer: Self-pay | Admitting: Pulmonary Disease

## 2015-10-12 ENCOUNTER — Ambulatory Visit (INDEPENDENT_AMBULATORY_CARE_PROVIDER_SITE_OTHER): Payer: 59 | Admitting: Family Medicine

## 2015-10-12 ENCOUNTER — Encounter: Payer: Self-pay | Admitting: Family Medicine

## 2015-10-12 VITALS — BP 131/84 | HR 80 | Temp 98.3°F | Resp 16 | Ht 65.0 in | Wt 243.0 lb

## 2015-10-12 DIAGNOSIS — J019 Acute sinusitis, unspecified: Secondary | ICD-10-CM | POA: Diagnosis not present

## 2015-10-12 DIAGNOSIS — J4531 Mild persistent asthma with (acute) exacerbation: Secondary | ICD-10-CM | POA: Diagnosis not present

## 2015-10-12 MED ORDER — PREDNISONE 20 MG PO TABS: ORAL_TABLET | ORAL | Status: DC

## 2015-10-12 MED ORDER — HYDROCODONE-HOMATROPINE 5-1.5 MG/5ML PO SYRP: ORAL_SOLUTION | ORAL | Status: DC

## 2015-10-12 MED ORDER — CLARITHROMYCIN 500 MG PO TABS: ORAL_TABLET | ORAL | Status: DC

## 2015-10-12 NOTE — Patient Instructions (Signed)
Continue saline nasal spray and take generic otc robitussin DM during daytime.

## 2015-10-12 NOTE — Progress Notes (Signed)
OFFICE VISIT  10/12/2015   CC:  Chief Complaint  Patient presents with  . URI    x 3 weeks  . Cyst    top of scalp x 10 years but has increased in size   HPI:    Patient is a 54 y.o. Caucasian female who presents for respiratory complaints. Onset of cough about 3 wks ago, started using her rescue inhaler some, then 1 wk ago started feeling nasal congestion/facial pressure, PND, +HA, no signif ST.  Has felt wheezing/chest tightness.  No fever. Onset of body aches the last couple days, fatigued, lower appetite.  Occ mild nausea w/out vomiting, no diarrhea.  No rash. Her albut inhaler helps wheeze/cough for a few hours. OTC sinus med/theraflu helps but only for a few hours. Pt is a never smoker.   Past Medical History  Diagnosis Date  . GERD (gastroesophageal reflux disease)   . Hernia   . Interstitial cystitis     Dr. Rosana Hoes: hasn't seen him in yrs or so, was on elmiron and coldn't tell much difference.  . Hay fever     seasonal  . Atypical chest pain 08/2012    Normal Myoview 09/2012  . Personal history of  adenomatous colonic polyp 10/28/2012    10/2012 - 4x10 mm tubular adenoma; repeat approx 10/2015  . Mild persistent asthma 08/31/2013  . History of histoplasmosis 08/31/2013    Past Surgical History  Procedure Laterality Date  . Cholecystectomy  2002  . Cesarean section  1988  . Tonsillectomy and adenoidectomy  1974  . Hernia repair  123456    umbilical, during lap chole  . Umbilical hernia repair  08/19/2012    Procedure: LAPAROSCOPIC UMBILICAL HERNIA;  Surgeon: Shann Medal, MD;  Location: [facility];  Service: General;  Laterality: N/A;  . Cardiovascular stress test  09/2012    Rest/Stress myoview: normal  . Colonoscopy w/ polypectomy  10/22/12    tubular adenoma, no high grade dysplasia (Dr. Carlean Purl); repeat approx 10/2015.    Outpatient Prescriptions Prior to Visit  Medication Sig Dispense Refill  . albuterol (PROAIR HFA) 108 (90 BASE) MCG/ACT inhaler Inhale 2  puffs into the lungs every 6 (six) hours as needed for wheezing. 1 Inhaler 3  . beclomethasone (QVAR) 80 MCG/ACT inhaler Inhale 2 puffs into the lungs daily. INHALE 2 PUFFS BY MOUTH 2 TIMES DAILY. 8.7 g 1  . dextromethorphan (DELSYM) 30 MG/5ML liquid Take 10 mLs by mouth as needed.     . famotidine (PEPCID) 20 MG tablet Take 20 mg by mouth as needed for heartburn or indigestion.    Marland Kitchen ibuprofen (ADVIL,MOTRIN) 200 MG tablet Take 400 mg by mouth every 6 (six) hours as needed. pain    . doxycycline (VIBRA-TABS) 100 MG tablet Take 1 tablet (100 mg total) by mouth 2 (two) times daily. (Patient not taking: Reported on 10/12/2015) 14 tablet 0  . nitroGLYCERIN (NITRODUR - DOSED IN MG/24 HR) 0.2 mg/hr patch Use 1/4 patch daily to the affected area (Patient not taking: Reported on 10/12/2015) 30 patch 1  . QVAR 80 MCG/ACT inhaler INHALE 2 PUFFS BY MOUTH 2 TIMES DAILY. (Patient not taking: Reported on 10/12/2015) 8.7 g 5   No facility-administered medications prior to visit.    Allergies  Allergen Reactions  . Ciprofloxacin Itching  . Penicillins Itching    ROS As per HPI  PE: Blood pressure 131/84, pulse 80, temperature 98.3 F (36.8 C), temperature source Oral, resp. rate 16, height 5\' 5"  (1.651 m),  weight 243 lb (110.224 kg), last menstrual period 10/05/2015, SpO2 97 %. VS: noted--normal. Gen: alert, NAD, NONTOXIC APPEARING. HEENT: eyes without injection, drainage, or swelling.  Ears: EACs clear, TMs with normal light reflex and landmarks.  Nose: Clear rhinorrhea, with some dried, crusty exudate adherent to mildly injected mucosa.  No purulent d/c.  No paranasal sinus TTP.  No facial swelling.  Throat and mouth without focal lesion.  No pharyngial swelling, erythema, or exudate.   Neck: supple, no LAD.   LUNGS: CTA bilat with frequent post-exhalation coughing, nonlabored resps.   CV: RRR, no m/r/g. EXT: no c/c/e SKIN: no rash  LABS:  none  IMPRESSION AND PLAN:  1) Acute sinusitis: Trial  of mucinex DM or robitussin DM otc as directed on the box. May use OTC nasal saline spray or irrigation solution bid. Clarithromycin 500 mg bid x 10d.  2) Acute asthma exacerbation:  Prednisone 40mg  qd x 5d, then 20mg  qd x 5d. Albut rescu q4h prn. Hycodan syrup rx, 1-2 tsp qhs prn, #110ml.  Therapeutic expectations and side effect profile of medication discussed today.  Patient's questions answered.  An After Visit Summary was printed and given to the patient.  FOLLOW UP: Return if symptoms worsen or fail to improve.

## 2015-10-12 NOTE — Progress Notes (Signed)
Pre visit review using our clinic review tool, if applicable. No additional management support is needed unless otherwise documented below in the visit note. 

## 2015-10-24 ENCOUNTER — Telehealth: Payer: Self-pay | Admitting: Pulmonary Disease

## 2015-10-24 NOTE — Telephone Encounter (Signed)
Spoke with pt, schedule acute ov with BQ tomorrow morning as VS has no openings.  Nothing further needed.

## 2015-10-24 NOTE — Telephone Encounter (Signed)
Patient Returned call  [phone] if it is after call [phone]

## 2015-10-24 NOTE — Telephone Encounter (Signed)
Spoke with pt, states she is still coughing, mostly nonprod cough with some thick white mucus.  Also notes upper airway wheezing, sinus congestion.  Denies fever, nausea.   Pt states she just finished a pred taper and biaxin rx given by Dr. Ernestine Conrad on Friday.  Pt requesting further recs.   Pt uses [facility] outpatient pharmacy.    VS please advise on further recs.  Thanks!

## 2015-10-24 NOTE — Telephone Encounter (Signed)
lmtcb x1 for pt. 

## 2015-10-24 NOTE — Telephone Encounter (Signed)
She needs to come in for visit to assess further.

## 2015-10-25 ENCOUNTER — Encounter: Payer: Self-pay | Admitting: Pulmonary Disease

## 2015-10-25 ENCOUNTER — Ambulatory Visit (INDEPENDENT_AMBULATORY_CARE_PROVIDER_SITE_OTHER): Payer: 59 | Admitting: Pulmonary Disease

## 2015-10-25 VITALS — BP 122/80 | HR 77 | Temp 98.2°F | Ht 65.0 in | Wt 242.0 lb

## 2015-10-25 DIAGNOSIS — R05 Cough: Secondary | ICD-10-CM

## 2015-10-25 DIAGNOSIS — R059 Cough, unspecified: Secondary | ICD-10-CM | POA: Insufficient documentation

## 2015-10-25 MED ORDER — BENZONATATE 200 MG PO CAPS: 200.0000 mg | ORAL_CAPSULE | Freq: Three times a day (TID) | ORAL | Status: DC | PRN

## 2015-10-25 MED ORDER — FLUCONAZOLE 100 MG PO TABS: 100.0000 mg | ORAL_TABLET | Freq: Every day | ORAL | Status: DC

## 2015-10-25 NOTE — Assessment & Plan Note (Signed)
She has a persistent dry cough which is caused by upper airway irritation. She does not have signs or symptoms suggestive of an active infection and that she's not coughing up any purulent mucus in her lungs are clear on exam. Further her vital signs are normal. I believe that the cough is due to ongoing laryngeal irritation from persistent coughing (cyclical cough or so-called irritable larynx) exacerbated by postnasal drip from likely allergic rhinitis.  Plan: Voice rest was encouraged Tessalon for cough Work note, I recommended that she not use her voice for at least 3 days She was instructed to use saline rinses, antihistamines, nasal steroids for the allergic rhinitis Call us back if no improvement.

## 2015-10-25 NOTE — Patient Instructions (Signed)
You need to try to suppress your cough to allow your larynx (voice box) to heal.  For three days don't talk, laugh, sing, or clear your throat. Do everything you can to suppress the cough during this time. Use hard candies (sugarless Jolly Ranchers) or non-mint or non-menthol containing cough drops during this time to soothe your throat.  Use a cough suppressant (Delsym or what I have prescribed you) around the clock during this time.  After three days, gradually increase the use of your voice and back off on the cough suppressants.  For the sinuses Use Milta Deiters Med rinses with distilled water at least twice per day using the instructions on the package. 1/2 hour after using the [facility] Med rinse, use Nasacort two puffs in each nostril once per day.  Remember that the Nasacort can take 1-2 weeks to work after regular use. Use generic zyrtec (cetirizine) every day.  If this doesn't help, then stop taking it and use chlorpheniramine-phenylephrine combination tablets.  Let us know if this does not improve

## 2015-10-25 NOTE — Addendum Note (Signed)
Addended by: Len Blalock on: 10/25/2015 09:39 AM   Modules accepted: Orders

## 2015-10-25 NOTE — Progress Notes (Signed)
Subjective:    Patient ID: Donna Willis, female    DOB: 08/02/1961, 54 y.o.   MRN: [id number]  HPI Chief Complaint  Patient presents with  . Acute Visit    VS pt presents with persistent prod cough with white mucus, PND, chest congestion.  PCP treated with clarithromycin and prednisone, which have not helped.     [name] says that her cough won't go away. She feels like when she inhales there is something in her throat Perhaps some tenderness on the left side over her eye, minimal drainage.  She is using saline, nasacort. She had some body aches, some chills, no fever.  She saw Dr. Vicente Serene was sick, got prednisone and antiobiotic. She had thick drainage with cough initially, now just a dry hack from a tickle in her throat. She isn't dyspnic unless she is wxercising heavily. Doesn't feel liek she is wheezing.  Past Medical History  Diagnosis Date  . GERD (gastroesophageal reflux disease)   . Hernia   . Interstitial cystitis     Dr. Rosana Hoes: hasn't seen him in yrs or so, was on elmiron and coldn't tell much difference.  . Hay fever     seasonal  . Atypical chest pain 08/2012    Normal Myoview 09/2012  . Personal history of  adenomatous colonic polyp 10/28/2012    10/2012 - 4x10 mm tubular adenoma; repeat approx 10/2015  . Mild persistent asthma 08/31/2013  . History of histoplasmosis 08/31/2013      Review of Systems     Objective:   Physical Exam Filed Vitals:   10/25/15 0910  BP: 122/80  Pulse: 77  Temp: 98.2 F (36.8 C)  TempSrc: Oral  Height: 5\' 5"  (1.651 m)  Weight: 242 lb (109.77 kg)  SpO2: 97%   RA  Gen: well appearing but frequent cough HENT: OP clear, TM's clear, neck supple PULM: CTA B, normal percussion CV: RRR, no mgr, trace edema GI: BS+, soft, nontender Derm: no cyanosis or rash Psyche: normal mood and affect        Assessment & Plan:  Cough She has a persistent dry cough which is caused by upper airway irritation. She does not have  signs or symptoms suggestive of an active infection and that she's not coughing up any purulent mucus in her lungs are clear on exam. Further her vital signs are normal. I believe that the cough is due to ongoing laryngeal irritation from persistent coughing (cyclical cough or so-called irritable larynx) exacerbated by postnasal drip from likely allergic rhinitis.  Plan: Voice rest was encouraged Tessalon for cough Work note, I recommended that she not use her voice for at least 3 days She was instructed to use saline rinses, antihistamines, nasal steroids for the allergic rhinitis Call us back if no improvement.     Current outpatient prescriptions:  .  albuterol (PROAIR HFA) 108 (90 BASE) MCG/ACT inhaler, Inhale 2 puffs into the lungs every 6 (six) hours as needed for wheezing., Disp: 1 Inhaler, Rfl: 3 .  beclomethasone (QVAR) 80 MCG/ACT inhaler, Inhale 2 puffs into the lungs daily. INHALE 2 PUFFS BY MOUTH 2 TIMES DAILY., Disp: 8.7 g, Rfl: 1 .  dextromethorphan (DELSYM) 30 MG/5ML liquid, Take 10 mLs by mouth as needed. , Disp: , Rfl:  .  famotidine (PEPCID) 20 MG tablet, Take 20 mg by mouth as needed for heartburn or indigestion., Disp: , Rfl:  .  guaiFENesin-dextromethorphan (ROBITUSSIN DM) 100-10 MG/5ML syrup, Take 5 mLs by mouth every 4 (  four) hours as needed for cough., Disp: , Rfl:  .  ibuprofen (ADVIL,MOTRIN) 200 MG tablet, Take 400 mg by mouth every 6 (six) hours as needed. pain, Disp: , Rfl:  .  benzonatate (TESSALON) 200 MG capsule, Take 1 capsule (200 mg total) by mouth 3 (three) times daily as needed for cough., Disp: 30 capsule, Rfl: 1

## 2015-11-02 ENCOUNTER — Encounter: Payer: Self-pay | Admitting: Internal Medicine

## 2015-11-15 MED FILL — QVAR 80 MCG ORAL INHALER: 80 | 30 days supply | Qty: 9 | Fill #2

## 2016-01-06 MED FILL — QVAR 80 MCG ORAL INHALER: 80 | 30 days supply | Qty: 9 | Fill #3

## 2016-03-07 MED FILL — QVAR 80 MCG ORAL INHALER: 80 | 30 days supply | Qty: 9 | Fill #4

## 2016-04-30 ENCOUNTER — Emergency Department
Admission: EM | Admit: 2016-04-30 | Discharge: 2016-04-30 | Disposition: A | Discharge status: Home / Self Care | Payer: 59 | Source: Home / Self Care | Attending: Family Medicine | Admitting: Family Medicine

## 2016-04-30 DIAGNOSIS — L03113 Cellulitis of right upper limb: Secondary | ICD-10-CM | POA: Diagnosis not present

## 2016-04-30 DIAGNOSIS — Z23 Encounter for immunization: Secondary | ICD-10-CM

## 2016-04-30 DIAGNOSIS — W5501XA Bitten by cat, initial encounter: Secondary | ICD-10-CM

## 2016-04-30 MED ORDER — TETANUS-DIPHTH-ACELL PERTUSSIS 5-2.5-18.5 LF-MCG/0.5 IM SUSP
0.5000 mL | Freq: Once | INTRAMUSCULAR | Status: AC
  Administered 2016-04-30: 0.5 mL via INTRAMUSCULAR

## 2016-04-30 MED ORDER — AMOXICILLIN-POT CLAVULANATE 875-125 MG PO TABS: 1.0000 | ORAL_TABLET | Freq: Two times a day (BID) | ORAL | Status: DC

## 2016-04-30 NOTE — ED Notes (Signed)
Was bitten by cat on Saturday morning.  Just above the wrist on right arm is red, swollen, and has a knot.  Denies numbness and tingling in fingers.  Has proof of rabies vaccination.

## 2016-04-30 NOTE — Discharge Instructions (Signed)
Elevate arm.  Apply heating pad 2 or 3 times daily.  May take Ibuprofen 200mg , 4 tabs every 8 hours with food for pain. If symptoms become significantly worse during the night or over the weekend, proceed to the local emergency room.    Cellulitis Cellulitis is an infection of the skin and the tissue beneath it. The infected area is usually red and tender. Cellulitis occurs most often in the arms and lower legs.  CAUSES  Cellulitis is caused by bacteria that enter the skin through cracks or cuts in the skin. The most common types of bacteria that cause cellulitis are staphylococci and streptococci. SIGNS AND SYMPTOMS   Redness and warmth.  Swelling.  Tenderness or pain.  Fever. DIAGNOSIS  Your health care provider can usually determine what is wrong based on a physical exam. Blood tests may also be done. TREATMENT  Treatment usually involves taking an antibiotic medicine. HOME CARE INSTRUCTIONS   Take your antibiotic medicine as directed by your health care provider. Finish the antibiotic even if you start to feel better.  Keep the infected arm or leg elevated to reduce swelling.  Apply a warm cloth to the affected area up to 4 times per day to relieve pain.  Take medicines only as directed by your health care provider.  Keep all follow-up visits as directed by your health care provider. SEEK MEDICAL CARE IF:   You notice red streaks coming from the infected area.  Your red area gets larger or turns dark in color.  Your bone or joint underneath the infected area becomes painful after the skin has healed.  Your infection returns in the same area or another area.  You notice a swollen bump in the infected area.  You develop new symptoms.  You have a fever. SEEK IMMEDIATE MEDICAL CARE IF:   You feel very sleepy.  You develop vomiting or diarrhea.  You have a general ill feeling (malaise) with muscle aches and pains.   This information is not intended to replace  advice given to you by your health care provider. Make sure you discuss any questions you have with your health care provider.   Document Released: 08/08/2005 Document Revised: 07/20/2015 Document Reviewed: 01/14/2012 Elsevier Interactive Patient Education [employer].

## 2016-04-30 NOTE — ED Provider Notes (Signed)
CSN: [id number]     Arrival date & time 04/30/16  1810 History   First MD Initiated Contact with Patient 04/30/16 1859     Chief Complaint  Patient presents with  . Animal Bite      HPI Comments: Patient was bitten on her right arm by her cat two days ago.  She has developed increasing redness, swelling and pain at the bite site.  The cat is not ill and its rabies vaccination is current.    Patient is a 55 y.o. female presenting with animal bite. The history is provided by the patient.  Animal Bite Contact animal:  Cat Location:  Shoulder/arm Shoulder/arm injury location:  R arm Time since incident:  2 days Pain details:    Quality:  Aching   Severity:  Mild   Timing:  Constant   Progression:  Worsening Incident location:  Home Provoked: unprovoked   Notifications:  None Animal's rabies vaccination status:  Up to date Animal in possession: yes   Tetanus status:  Out of date Relieved by:  None tried Worsened by:  Activity Ineffective treatments:  None tried Associated symptoms: swelling   Associated symptoms: no fever, no numbness and no rash     Past Medical History  Diagnosis Date  . GERD (gastroesophageal reflux disease)   . Hernia   . Interstitial cystitis     Dr. Rosana Hoes: hasn't seen him in yrs or so, was on elmiron and coldn't tell much difference.  . Hay fever     seasonal  . Atypical chest pain 08/2012    Normal Myoview 09/2012  . Personal history of  adenomatous colonic polyp 10/28/2012    10/2012 - 4x10 mm tubular adenoma; repeat approx 10/2015  . Mild persistent asthma 08/31/2013  . History of histoplasmosis 08/31/2013   Past Surgical History  Procedure Laterality Date  . Cholecystectomy  2002  . Cesarean section  1988  . Tonsillectomy and adenoidectomy  1974  . Hernia repair  123456    umbilical, during lap chole  . Umbilical hernia repair  08/19/2012    Procedure: LAPAROSCOPIC UMBILICAL HERNIA;  Surgeon: Shann Medal, MD;  Location: [facility];  Service:  General;  Laterality: N/A;  . Cardiovascular stress test  09/2012    Rest/Stress myoview: normal  . Colonoscopy w/ polypectomy  10/22/12    tubular adenoma, no high grade dysplasia (Dr. Carlean Purl); repeat approx 10/2015.   Family History  Problem Relation Age of Onset  . Colon polyps Mother   . Diverticulitis Father   . Other Maternal Uncle     great-stom or colon cancer  . Rectal cancer Neg Hx    Social History  Substance Use Topics  . Smoking status: Never Smoker   . Smokeless tobacco: Never Used  . Alcohol Use: Yes     Comment: rare   OB History    No data available     Review of Systems  Constitutional: Negative for fever.  Skin: Negative for rash.  Neurological: Negative for numbness.  All other systems reviewed and are negative.   Allergies  Ciprofloxacin and Penicillins  Home Medications   Prior to Admission medications   Medication Sig Start Date End Date Taking? Authorizing Provider  albuterol (PROAIR HFA) 108 (90 BASE) MCG/ACT inhaler Inhale 2 puffs into the lungs every 6 (six) hours as needed for wheezing. 08/31/13   Chesley Mires, MD  amoxicillin-clavulanate (AUGMENTIN) 875-125 MG tablet Take 1 tablet by mouth 2 (two) times daily. Take with food  04/30/16   Kandra Nicolas, MD  beclomethasone (QVAR) 80 MCG/ACT inhaler Inhale 2 puffs into the lungs daily. INHALE 2 PUFFS BY MOUTH 2 TIMES DAILY. 05/20/15   Chesley Mires, MD  benzonatate (TESSALON) 200 MG capsule Take 1 capsule (200 mg total) by mouth 3 (three) times daily as needed for cough. 10/25/15   Juanito Doom, MD  dextromethorphan (DELSYM) 30 MG/5ML liquid Take 10 mLs by mouth as needed.     Historical Provider, MD  famotidine (PEPCID) 20 MG tablet Take 20 mg by mouth as needed for heartburn or indigestion.    Historical Provider, MD  fluconazole (DIFLUCAN) 100 MG tablet Take 1 tablet (100 mg total) by mouth daily. 10/25/15   Juanito Doom, MD  guaiFENesin-dextromethorphan (ROBITUSSIN DM) 100-10 MG/5ML  syrup Take 5 mLs by mouth every 4 (four) hours as needed for cough.    Historical Provider, MD  ibuprofen (ADVIL,MOTRIN) 200 MG tablet Take 400 mg by mouth every 6 (six) hours as needed. pain    Historical Provider, MD   Meds Ordered and Administered this Visit   Medications  Tdap (BOOSTRIX) injection 0.5 mL (not administered)    BP 118/79 mmHg  Pulse 69  Temp(Src) 97.6 F (36.4 C) (Oral)  Ht 5\' 4"  (1.626 m)  Wt 234 lb (106.142 kg)  BMI 40.15 kg/m2  SpO2 98%  LMP 04/23/2016 No data found.   Physical Exam  Constitutional: She is oriented to person, place, and time. She appears well-developed and well-nourished. No distress.  HENT:  Head: Atraumatic.  Mouth/Throat: Oropharynx is clear and moist.  Eyes: Pupils are equal, round, and reactive to light.  Neck: Neck supple.  Cardiovascular: Normal heart sounds.   Pulmonary/Chest: Breath sounds normal.  Musculoskeletal:       Right forearm: She exhibits tenderness and swelling.       Arms: Right dorsal forearm has an area of erythema, swelling, and tenderness to palpation as noted on diagram.  Distal neurovascular function is intact.     Lymphadenopathy:    She has no cervical adenopathy.  Neurological: She is alert and oriented to person, place, and time.  Skin: Skin is warm and dry.  Nursing note and vitals reviewed.   ED Course  Procedures none     MDM   1. Cat bite, initial encounter   2. Cellulitis of right arm    Tdap administered. Begin Augmentin to cover pasteurella multocida (patient reports that she has had no adverse effects from Augmentin). Elevate arm.  Apply heating pad 2 or 3 times daily.  May take Ibuprofen 200mg , 4 tabs every 8 hours with food for pain. If symptoms become significantly worse during the night or over the weekend, proceed to the local emergency room.     Kandra Nicolas, MD 05/07/16 2340

## 2016-05-28 MED FILL — QVAR 80 MCG ORAL INHALER: 80 | 30 days supply | Qty: 9 | Fill #5

## 2016-06-11 ENCOUNTER — Encounter: Payer: Self-pay | Admitting: Pulmonary Disease

## 2016-06-11 ENCOUNTER — Ambulatory Visit (INDEPENDENT_AMBULATORY_CARE_PROVIDER_SITE_OTHER): Payer: 59 | Admitting: Pulmonary Disease

## 2016-06-11 VITALS — BP 126/64 | HR 65 | Ht 64.0 in | Wt 228.0 lb

## 2016-06-11 DIAGNOSIS — J4520 Mild intermittent asthma, uncomplicated: Secondary | ICD-10-CM | POA: Diagnosis not present

## 2016-06-11 DIAGNOSIS — B370 Candidal stomatitis: Secondary | ICD-10-CM

## 2016-06-11 MED ORDER — CLOTRIMAZOLE 10 MG MT TROC: 10.0000 mg | Freq: Every day | OROMUCOSAL | 0 refills | Status: AC

## 2016-06-11 MED ORDER — BECLOMETHASONE DIPROPIONATE 80 MCG/ACT IN AERS: 2.0000 | INHALATION_SPRAY | Freq: Every day | RESPIRATORY_TRACT | 5 refills | Status: DC

## 2016-06-11 MED FILL — CLOTRIMAZOLE 10 MG TROCHE: 10 | 7 days supply | Qty: 35 | Fill #0

## 2016-06-11 NOTE — Progress Notes (Signed)
History of Present Illness Donna Willis is a 55 y.o. female with upper airway cough, mild asthma followed by Dr. Halford Chessman.   06/11/2016 Annual Follow Up: Pt presents to the office today for follow up. This has been a stable interval for her. She has been stable on her QVAR  2 puffs once daily as her maintenance. She has used her [employer] inhaler as rescue twice ast week, but not since then. She had a flare in December that was treated with saline rinses, antihistamines, nasal steroids for the allergic rhinitis. She was also prescribed Tessalon 200 mg three times daily and voice rest.She has been well since. She is at her baseline and she is using her medications as prescribed.    Past medical hx Past Medical History:  Diagnosis Date  . Atypical chest pain 08/2012   Normal Myoview 09/2012  . GERD (gastroesophageal reflux disease)   . Hay fever    seasonal  . Hernia   . 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 Interstitial cystitis    Dr. Rosana Hoes: hasn't seen him in yrs or so, was on elmiron and coldn't tell much difference.  . Mild persistent asthma 08/31/2013  . Personal history of  adenomatous colonic polyp 10/28/2012   10/2012 - 4x10 mm tubular adenoma; repeat approx 10/2015     Past surgical hx, Family hx, Social hx all reviewed.  Current Outpatient Prescriptions on File Prior to Visit  Medication Sig  . albuterol (PROAIR HFA) 108 (90 BASE) MCG/ACT inhaler Inhale 2 puffs into the lungs every 6 (six) hours as needed for wheezing.  . benzonatate (TESSALON) 200 MG capsule Take 1 capsule (200 mg total) by mouth 3 (three) times daily as needed for cough.  . dextromethorphan (DELSYM) 30 MG/5ML liquid Take 10 mLs by mouth as needed.   . famotidine (PEPCID) 20 MG tablet Take 20 mg by mouth as needed for heartburn or indigestion.  . fluconazole (DIFLUCAN) 100 MG tablet Take 1 tablet (100 mg total) by mouth daily.  Marland Kitchen guaiFENesin-dextromethorphan (ROBITUSSIN DM) 100-10 MG/5ML syrup Take 5 mLs  by mouth every 4 (four) hours as needed for cough.  Marland Kitchen ibuprofen (ADVIL,MOTRIN) 200 MG tablet Take 400 mg by mouth every 6 (six) hours as needed. pain   No current facility-administered medications on file prior to visit.      Allergies  Allergen Reactions  . Ciprofloxacin Itching  . Penicillins Itching    Review Of Systems:  Constitutional:   No  weight loss, night sweats,  Fevers, chills, fatigue, or  lassitude.  HEENT:   No headaches,  Difficulty swallowing,  Tooth/dental problems, or  Sore throat,                No sneezing, itching, ear ache, nasal congestion, post nasal drip,   CV:  No chest pain,  Orthopnea, PND, swelling in lower extremities, anasarca, dizziness, palpitations, syncope.   GI  No heartburn, indigestion, abdominal pain, nausea, vomiting, diarrhea, change in bowel habits, loss of appetite, bloody stools.   Resp: No shortness of breath with exertion or at rest.  No excess mucus, no productive cough,  No non-productive cough,  No coughing up of blood.  No change in color of mucus.  No wheezing.  No chest wall deformity  Skin: no rash or lesions.  GU: no dysuria, change in color of urine, no urgency or frequency.  No flank pain, no hematuria   MS:  No joint pain or swelling.  No decreased range  of motion.  No back pain.  Psych:  No change in mood or affect. No depression or anxiety.  No memory loss.   Vital Signs BP 126/64 (BP Location: Left Arm, Cuff Size: Normal)   Pulse 65   Ht 5\' 4"  (1.626 m)   Wt 228 lb (103.4 kg)   SpO2 98%   BMI 39.14 kg/m    Physical Exam:  General- No distress,  A&Ox3, pleasant ENT: No sinus tenderness, TM clear, pale nasal mucosa, no oral exudate,no post nasal drip, no LAN Cardiac: S1, S2, regular rate and rhythm, no murmur Chest: No wheeze/ rales/ dullness; no accessory muscle use, no nasal flaring, no sternal retractions Abd.: Soft Non-tender Ext: No clubbing cyanosis, edema Neuro:  normal strength Skin: No rashes,  warm and dry Psych: normal mood and behavior   Assessment/Plan  Mild intermittent asthma in adult without complication Stable Cough/ mild asthma Maintained on QVAR once daily and Pro Air Plan: We will place a prescription for refill of your QVAR. Continue using your QVar once daily every day, as you have been doing. Rinse your mouth after using your QVAR. Continue to use your Pro Air as needed shortness of breath, wheezing or cough. Mycelex Troches 5 times daily for 7 days. Follow up with Dr. Halford Chessman in 1 year. Please contact office for sooner follow up if symptoms do not improve or worsen or seek emergency care       Magdalen Spatz, NP 06/11/2016  5:04 PM   She had cough during Fall after exposure to smoke.  Better now.  She has some throat soreness.  Mild thrush.  No LAN.  No wheeze.  HR regular.  Assessment/plan:  Mild, persistent asthma. - Qvar daily - spacer device  Thrush. - mycelex   Chesley Mires, MD [facility] Pulmonary/Critical Care 06/11/2016, 5:09 PM Pager:  [phone]

## 2016-06-11 NOTE — Patient Instructions (Addendum)
It is nice to meet you today. We will place a prescription for refill of your QVAR. Continue using your QVar once daily every day, as you have been doing. Rinse your mouth after using your QVAR. We will prescribe a spacer for you today, and show you how to use it. Continue to use your Pro Air as needed shortness of breath, wheezing or cough. Mycelex Troches 5 times daily for 7 days. Follow up with Dr. Halford Chessman in 1 year. Please contact office for sooner follow up if symptoms do not improve or worsen or seek emergency care

## 2016-06-11 NOTE — Assessment & Plan Note (Signed)
Stable Cough/ mild asthma Maintained on QVAR once daily and Pro Air Plan: We will place a prescription for refill of your QVAR. Continue using your QVar once daily every day, as you have been doing. Rinse your mouth after using your QVAR. Continue to use your Pro Air as needed shortness of breath, wheezing or cough. Mycelex Troches 5 times daily for 7 days. Follow up with Dr. Halford Chessman in 1 year. Please contact office for sooner follow up if symptoms do not improve or worsen or seek emergency care

## 2016-06-12 ENCOUNTER — Encounter: Payer: Self-pay | Admitting: Family Medicine

## 2016-06-23 DIAGNOSIS — H524 Presbyopia: Secondary | ICD-10-CM | POA: Diagnosis not present

## 2016-07-19 MED FILL — QVAR 80 MCG ORAL INHALER: 80 | 30 days supply | Qty: 9 | Fill #0

## 2016-07-27 ENCOUNTER — Encounter: Payer: Self-pay | Admitting: Internal Medicine

## 2016-08-17 ENCOUNTER — Ambulatory Visit (AMBULATORY_SURGERY_CENTER): Payer: Self-pay | Admitting: *Deleted

## 2016-08-17 VITALS — Ht 64.0 in | Wt 227.0 lb

## 2016-08-17 DIAGNOSIS — Z86010 Personal history of colonic polyps: Secondary | ICD-10-CM

## 2016-08-17 NOTE — Progress Notes (Signed)
Patient denies any allergies to eggs or soy. Patient denies any problems with anesthesia/sedation. Patient denies any oxygen use at home and does not take any diet/weight loss medications. Pt declined EMMI education. 

## 2016-09-03 ENCOUNTER — Encounter: Payer: Self-pay | Admitting: Internal Medicine

## 2016-09-03 ENCOUNTER — Ambulatory Visit (AMBULATORY_SURGERY_CENTER): Payer: 59 | Admitting: Internal Medicine

## 2016-09-03 VITALS — BP 111/77 | HR 69 | Temp 98.0°F | Resp 14 | Ht 64.0 in | Wt 227.0 lb

## 2016-09-03 DIAGNOSIS — J45909 Unspecified asthma, uncomplicated: Secondary | ICD-10-CM | POA: Diagnosis not present

## 2016-09-03 DIAGNOSIS — Z86010 Personal history of colonic polyps: Secondary | ICD-10-CM | POA: Diagnosis present

## 2016-09-03 DIAGNOSIS — Z1211 Encounter for screening for malignant neoplasm of colon: Secondary | ICD-10-CM | POA: Diagnosis not present

## 2016-09-03 MED ORDER — SODIUM CHLORIDE 0.9 % IV SOLN: 500.0000 mL | INTRAVENOUS | Status: DC

## 2016-09-03 NOTE — Patient Instructions (Addendum)
   No polyps today! Your next routine colonoscopy should be in 5 years - 2022.  I appreciate the opportunity to care for you. Gatha Mayer, MD, FACG   YOU HAD AN ENDOSCOPIC PROCEDURE TODAY AT [facility]:   Refer to the procedure report that was given to you for any specific questions about what was found during the examination.  If the procedure report does not answer your questions, please call your gastroenterologist to clarify.  If you requested that your care partner not be given the details of your procedure findings, then the procedure report has been included in a sealed envelope for you to review at your convenience later.  YOU SHOULD EXPECT: Some feelings of bloating in the abdomen. Passage of more gas than usual.  Walking can help get rid of the air that was put into your GI tract during the procedure and reduce the bloating. If you had a lower endoscopy (such as a colonoscopy or flexible sigmoidoscopy) you may notice spotting of blood in your stool or on the toilet paper. If you underwent a bowel prep for your procedure, you may not have a normal bowel movement for a few days.  Please Note:  You might notice some irritation and congestion in your nose or some drainage.  This is from the oxygen used during your procedure.  There is no need for concern and it should clear up in a day or so.  SYMPTOMS TO REPORT IMMEDIATELY:   Following lower endoscopy (colonoscopy or flexible sigmoidoscopy):  Excessive amounts of blood in the stool  Significant tenderness or worsening of abdominal pains  Swelling of the abdomen that is new, acute  Fever of 100F or higher   For urgent or emergent issues, a gastroenterologist can be reached at any hour by calling [phone].   DIET:  We do recommend a small meal at first, but then you may proceed to your regular diet.  Drink plenty of fluids but you should avoid alcoholic beverages for 24 hours.  ACTIVITY:  You should  plan to take it easy for the rest of today and you should NOT DRIVE or use heavy machinery until tomorrow (because of the sedation medicines used during the test).    FOLLOW UP: Our staff will call the number listed on your records the next business day following your procedure to check on you and address any questions or concerns that you may have regarding the information given to you following your procedure. If we do not reach you, we will leave a message.  However, if you are feeling well and you are not experiencing any problems, there is no need to return our call.  We will assume that you have returned to your regular daily activities without incident.  If any biopsies were taken you will be contacted by phone or by letter within the next 1-3 weeks.  Please call us at [phone] if you have not heard about the biopsies in 3 weeks.    SIGNATURES/CONFIDENTIALITY: You and/or your care partner have signed paperwork which will be entered into your electronic medical record.  These signatures attest to the fact that that the information above on your After Visit Summary has been reviewed and is understood.  Full responsibility of the confidentiality of this discharge information lies with you and/or your care-partner.  Normal exam.  Repeat colonoscopy in 5 years 2022.

## 2016-09-03 NOTE — Op Note (Signed)
[facility] Patient Name: Donna Willis Procedure Date: 09/03/2016 3:04 PM MRN: [id number] Endoscopist: Gatha Mayer , MD Age: 55 Referring MD:  Date of Birth: 05-12-1961 Gender: Female Account #: [id number] Procedure:                Colonoscopy Indications:              Surveillance: Personal history of adenomatous                            polyps on last colonoscopy > 3 years ago Medicines:                Propofol per Anesthesia, Monitored Anesthesia Care Procedure:                Pre-Anesthesia Assessment:                           - Prior to the procedure, a History and Physical                            was performed, and patient medications and                            allergies were reviewed. The patient's tolerance of                            previous anesthesia was also reviewed. The risks                            and benefits of the procedure and the sedation                            options and risks were discussed with the patient.                            All questions were answered, and informed consent                            was obtained. Prior Anticoagulants: The patient has                            taken no previous anticoagulant or antiplatelet                            agents. ASA Grade Assessment: II - A patient with                            mild systemic disease. After reviewing the risks                            and benefits, the patient was deemed in                            satisfactory condition to undergo the procedure.  After obtaining informed consent, the colonoscope                            was passed under direct vision. Throughout the                            procedure, the patient's blood pressure, pulse, and                            oxygen saturations were monitored continuously. The                            Model CF-HQ190L [id number]) scope was introduced                             through the anus and advanced to the the cecum,                            identified by appendiceal orifice and ileocecal                            valve. The colonoscopy was performed without                            difficulty. The patient tolerated the procedure                            well. The quality of the bowel preparation was                            excellent. The bowel preparation used was Miralax.                            The ileocecal valve, appendiceal orifice, and                            rectum were photographed. Scope In: 3:10:45 PM Scope Out: 3:22:42 PM Scope Withdrawal Time: 0 hours 9 minutes 9 seconds  Total Procedure Duration: 0 hours 11 minutes 57 seconds  Findings:                 The perianal and digital rectal examinations were                            normal.                           The entire examined colon appeared normal on direct                            and retroflexion views. Complications:            No immediate complications. Estimated Blood Loss:     Estimated blood loss: none. Impression:               - The entire examined colon is normal  on direct and                            retroflexion views.                           - No specimens collected. Recommendation:           - Patient has a contact number available for                            emergencies. The signs and symptoms of potential                            delayed complications were discussed with the                            patient. Return to normal activities tomorrow.                            Written discharge instructions were provided to the                            patient.                           - Resume previous diet.                           - Continue present medications.                           - Repeat colonoscopy in 5 years for surveillance. Gatha Mayer, MD 09/03/2016 3:31:52 PM This report has been signed electronically.

## 2016-09-03 NOTE — Progress Notes (Signed)
A/ox3 pleased with MAC, report to April RN 

## 2016-09-04 ENCOUNTER — Telehealth: Payer: Self-pay | Admitting: *Deleted

## 2016-09-04 NOTE — Telephone Encounter (Signed)
Left message on f/u callback 

## 2016-09-05 ENCOUNTER — Encounter: Payer: Self-pay | Admitting: Family Medicine

## 2016-10-15 ENCOUNTER — Other Ambulatory Visit: Payer: Self-pay | Admitting: Pulmonary Disease

## 2016-10-15 MED FILL — QVAR 80 MCG ORAL INHALER: 80 | 30 days supply | Qty: 9 | Fill #1

## 2016-10-16 MED FILL — VENTOLIN HFA 90 MCG INHALER: 108 (90 BAS | 30 days supply | Qty: 18 | Fill #0

## 2016-11-09 ENCOUNTER — Ambulatory Visit (INDEPENDENT_AMBULATORY_CARE_PROVIDER_SITE_OTHER): Payer: 59 | Admitting: Family Medicine

## 2016-11-09 ENCOUNTER — Encounter: Payer: Self-pay | Admitting: Family Medicine

## 2016-11-09 VITALS — BP 110/80 | HR 83 | Temp 98.2°F | Resp 16 | Ht 64.0 in | Wt 229.0 lb

## 2016-11-09 DIAGNOSIS — J4531 Mild persistent asthma with (acute) exacerbation: Secondary | ICD-10-CM | POA: Diagnosis not present

## 2016-11-09 DIAGNOSIS — R05 Cough: Secondary | ICD-10-CM

## 2016-11-09 DIAGNOSIS — J0100 Acute maxillary sinusitis, unspecified: Secondary | ICD-10-CM

## 2016-11-09 DIAGNOSIS — R059 Cough, unspecified: Secondary | ICD-10-CM

## 2016-11-09 LAB — POC INFLUENZA A&B (BINAX/QUICKVUE)
Influenza A, POC: NEGATIVE
Influenza B, POC: NEGATIVE

## 2016-11-09 MED ORDER — PREDNISONE 20 MG PO TABS
ORAL_TABLET | ORAL | 0 refills | Status: DC
Start: 2016-11-09 — End: 2017-07-25

## 2016-11-09 MED ORDER — HYDROCODONE-HOMATROPINE 5-1.5 MG/5ML PO SYRP: 5.0000 mL | ORAL_SOLUTION | Freq: Three times a day (TID) | ORAL | 0 refills | Status: DC | PRN

## 2016-11-09 MED ORDER — AZITHROMYCIN 250 MG PO TABS: ORAL_TABLET | ORAL | 0 refills | Status: DC

## 2016-11-09 MED FILL — HYDROCODONE-HOMATROPINE SYR: 5-1.5 | 8 days supply | Qty: 120 | Fill #0

## 2016-11-09 MED FILL — predniSONE 20 MG TABS: 20 | 10 days supply | Qty: 15 | Fill #0

## 2016-11-09 MED FILL — AZITHROMYCIN 250 MG TABLET: 250 | 5 days supply | Qty: 6 | Fill #0

## 2016-11-09 NOTE — Progress Notes (Signed)
OFFICE VISIT  11/09/2016   CC:  Chief Complaint  Patient presents with  . URI    x 6 days   HPI:    Patient is a 55 y.o. Caucasian female with persistent asthma who presents for respiratory symptoms. Onset 1 week ago with wheezing, then she got nasal congestion/sinus pressure, lots of coughing, has lost her voice today.  No fever.  Cough is productive of thick yellow mucous.  Using the sinugator to irrigate sinuses.   No SOB.  No chest tightness.  No ST.  +Facial pain and some upper teeth pain diffusely.  Horrible HA in peri-orbital area.  +Mild diffuse body aches.     Past Medical History:  Diagnosis Date  . Atypical chest pain 08/2012   Normal Myoview 09/2012  . GERD (gastroesophageal reflux disease)   . Hay fever    seasonal  . Hernia   . 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 Interstitial cystitis    Dr. Rosana Hoes: hasn't seen him in yrs or so, was on elmiron and coldn't tell much difference.  . Mild persistent asthma 08/31/2013   Stable as of pulm f/u 06/2016: no med changes made---1 yr f/u recommended.  . Personal history of  adenomatous colonic polyp 10/28/2012   10/2012 - 4x10 mm tubular adenoma; repeat 09/03/16 showed NO POLYPS.  Recall 5 yrs (08/2021).    Past Surgical History:  Procedure Laterality Date  . CARDIOVASCULAR STRESS TEST  09/2012   Rest/Stress myoview: normal  . CESAREAN SECTION  1988  . CHOLECYSTECTOMY  2002  . COLONOSCOPY W/ POLYPECTOMY  10/22/12; 09/03/16   2013; tubular adenoma, no high grade dysplasia (Dr. Carlean Purl).  Repeat 08/2016: no polyps.  Recall 5 yrs (08/2021--Dr. Carlean Purl).  . CYSTOSCOPY    . HERNIA REPAIR  123456   umbilical, during lap chole  . TONSILLECTOMY AND ADENOIDECTOMY  1974  . UMBILICAL HERNIA REPAIR  08/19/2012   Procedure: LAPAROSCOPIC UMBILICAL HERNIA;  Surgeon: Shann Medal, MD;  Location: [facility];  Service: General;  Laterality: N/A;    Outpatient Medications Prior to Visit  Medication Sig Dispense Refill  .  beclomethasone (QVAR) 80 MCG/ACT inhaler Inhale 2 puffs into the lungs daily. INHALE 2 PUFFS BY MOUTH 2 TIMES DAILY. 8.7 g 5  . famotidine (PEPCID) 20 MG tablet Take 20 mg by mouth as needed for heartburn or indigestion.    Marland Kitchen guaiFENesin-dextromethorphan (ROBITUSSIN DM) 100-10 MG/5ML syrup Take 5 mLs by mouth every 4 (four) hours as needed for cough.    . triamcinolone (NASACORT) 55 MCG/ACT AERO nasal inhaler Place 1 spray into the nose as needed.    . VENTOLIN HFA 108 (90 Base) MCG/ACT inhaler INHALE 2 PUFFS INTO THE LUNGS EVERY 6 HOURS AS NEEDED FOR WHEEZING. 18 g 3  . benzonatate (TESSALON) 200 MG capsule Take 1 capsule (200 mg total) by mouth 3 (three) times daily as needed for cough. (Patient not taking: Reported on 11/09/2016) 30 capsule 1  . dextromethorphan (DELSYM) 30 MG/5ML liquid Take 10 mLs by mouth as needed.     Marland Kitchen ibuprofen (ADVIL,MOTRIN) 200 MG tablet Take 400 mg by mouth every 6 (six) hours as needed. pain     Facility-Administered Medications Prior to Visit  Medication Dose Route Frequency Provider Last Rate Last Dose  . 0.9 %  sodium chloride infusion  500 mL Intravenous Continuous Gatha Mayer, MD        Allergies  Allergen Reactions  . Penicillins Itching  . Ciprofloxacin Itching  ROS As per HPI  PE: Blood pressure 110/80, pulse 83, temperature 98.2 F (36.8 C), temperature source Oral, resp. rate 16, height 5\' 4"  (1.626 m), weight 229 lb (103.9 kg), last menstrual period 11/02/2016, SpO2 98 %. VS: noted--normal. Gen: alert, NAD, NONTOXIC APPEARING. HEENT: eyes without injection, drainage, or swelling.  Ears: EACs clear, TMs with normal light reflex and landmarks.  Nose: Scant clear rhinorrhea, with some dried, crusty exudate adherent to mildly injected mucosa.  No purulent d/c.  No paranasal sinus TTP.  No facial swelling.  Throat and mouth without focal lesion.  No pharyngial swelling, erythema, or exudate.   Neck: supple, no LAD.   LUNGS: CTA on inspiration  bilat, with diffuse coarse exp wheezing and lots of coughing on exhalation.  Exp phase is mildly prolonged.  Nonlabored resps.   CV: RRR, no m/r/g. EXT: no c/c/e SKIN: no rash  LABS:  Influenza A/B: NEGATIVE  IMPRESSION AND PLAN:  Acute sinusitis, with acute exacerbation of asthma. Prednisone 40mg  qd x 5d, then 20mg  qd x 5d. Z-pack. Hycodan syrup, 1 tsp po tid prn cough, #169ml.  Therapeutic expectations and side effect profile of medication discussed today.  Patient's questions answered. Ventolin q4h prn.  An After Visit Summary was printed and given to the patient.  FOLLOW UP: Return if symptoms worsen or fail to improve.  Signed:  Crissie Sickles, MD           11/09/2016

## 2016-11-09 NOTE — Progress Notes (Signed)
Pre visit review using our clinic review tool, if applicable. No additional management support is needed unless otherwise documented below in the visit note. 

## 2016-12-17 MED FILL — QVAR 80 MCG ORAL INHALER: 80 | 30 days supply | Qty: 9 | Fill #2

## 2017-01-03 ENCOUNTER — Encounter: Payer: Self-pay | Admitting: Family Medicine

## 2017-02-25 ENCOUNTER — Telehealth: Payer: Self-pay

## 2017-02-25 MED ORDER — BECLOMETHASONE DIPROP HFA 80 MCG/ACT IN AERB: 2.0000 | INHALATION_SPRAY | Freq: Two times a day (BID) | RESPIRATORY_TRACT | 3 refills | Status: DC

## 2017-02-25 MED FILL — QVAR REDIHALER 80 MCG/ACT A: 80 | 30 days supply | Qty: 11 | Fill #0

## 2017-02-25 NOTE — Telephone Encounter (Signed)
Fax came through from [facility] requesting Qvar redihaler to be sent in since the regular Qvar is discontinued. Sent Rx in for pt.

## 2017-03-11 DIAGNOSIS — Z789 Other specified health status: Secondary | ICD-10-CM | POA: Diagnosis not present

## 2017-05-10 MED FILL — QVAR REDIHALER 80 MCG/ACT A: 80 | 30 days supply | Qty: 11 | Fill #1

## 2017-07-18 MED FILL — QVAR REDIHALER 80 MCG/ACT A: 80 | 30 days supply | Qty: 11 | Fill #2

## 2017-07-25 ENCOUNTER — Encounter: Payer: Self-pay | Admitting: Family Medicine

## 2017-07-25 ENCOUNTER — Ambulatory Visit (INDEPENDENT_AMBULATORY_CARE_PROVIDER_SITE_OTHER): Payer: 59 | Admitting: Family Medicine

## 2017-07-25 VITALS — BP 142/89 | HR 83 | Temp 97.5°F | Resp 16 | Ht 64.0 in | Wt 235.0 lb

## 2017-07-25 DIAGNOSIS — J069 Acute upper respiratory infection, unspecified: Secondary | ICD-10-CM | POA: Diagnosis not present

## 2017-07-25 DIAGNOSIS — J4531 Mild persistent asthma with (acute) exacerbation: Secondary | ICD-10-CM | POA: Diagnosis not present

## 2017-07-25 MED ORDER — PREDNISONE 20 MG PO TABS
ORAL_TABLET | ORAL | 0 refills | Status: DC
Start: 2017-07-25 — End: 2018-02-25

## 2017-07-25 MED ORDER — AZITHROMYCIN 250 MG PO TABS: ORAL_TABLET | ORAL | 0 refills | Status: DC

## 2017-07-25 MED FILL — AZITHROMYCIN 250 MG TABLET: 250 | 5 days supply | Qty: 6 | Fill #0

## 2017-07-25 MED FILL — predniSONE 20 MG TABS: 20 | 5 days supply | Qty: 10 | Fill #0

## 2017-07-25 NOTE — Patient Instructions (Signed)
Get otc generic robitussin DM OR Mucinex DM and use as directed on the packaging for cough and congestion. Use otc generic saline nasal spray 2-3 times per day to irrigate/moisturize your nasal passages.   

## 2017-07-25 NOTE — Progress Notes (Signed)
OFFICE VISIT  07/25/2017   CC:  Chief Complaint  Patient presents with  . Cough   HPI:    Patient is a 56 y.o. Caucasian female accompanied by her husband, presents for respiratory symptoms. Of note, she has hx of allergic rhinitis and mild persistent asthma. Cough onset 1 wk ago, then runny nose/nasal congestion, occ coughs up thick white mucous.  Bad HA. Initially.  No ST or fever.  Some wheezing but no SOB.  Uses albuterol 2-3 times per day since onset of illness.  Robitussin DM, nasacort.  Took one dose of hydrocodone cough syrup early in the illness but this gave her a bad HA so she hasn't taken it again.  Minimal body achiness early on in the illness.  Past Medical History:  Diagnosis Date  . Atypical chest pain 08/2012   Normal Myoview 09/2012  . GERD (gastroesophageal reflux disease)   . Hay fever    seasonal  . Hernia   . 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 Interstitial cystitis    Dr. Rosana Hoes: hasn't seen him in yrs or so, was on elmiron and coldn't tell much difference.  . Mild persistent asthma 08/31/2013   Stable as of pulm f/u 06/2016: no med changes made---1 yr f/u recommended.  . Personal history of  adenomatous colonic polyp 10/28/2012   10/2012 - 4x10 mm tubular adenoma; repeat 09/03/16 showed NO POLYPS.  Recall 5 yrs (08/2021).    Past Surgical History:  Procedure Laterality Date  . CARDIOVASCULAR STRESS TEST  09/2012   Rest/Stress myoview: normal  . CESAREAN SECTION  1988  . CHOLECYSTECTOMY  2002  . COLONOSCOPY W/ POLYPECTOMY  10/22/12; 09/03/16   2013; tubular adenoma, no high grade dysplasia (Dr. Carlean Purl).  Repeat 08/2016: no polyps.  Recall 5 yrs (08/2021--Dr. Carlean Purl).  . CYSTOSCOPY    . HERNIA REPAIR  9470   umbilical, during lap chole  . TONSILLECTOMY AND ADENOIDECTOMY  1974  . UMBILICAL HERNIA REPAIR  08/19/2012   Procedure: LAPAROSCOPIC UMBILICAL HERNIA;  Surgeon: Shann Medal, MD;  Location: [facility];  Service: General;  Laterality: N/A;     Outpatient Medications Prior to Visit  Medication Sig Dispense Refill  . Beclomethasone Diprop HFA (QVAR REDIHALER) 80 MCG/ACT AERB Inhale 2 puffs into the lungs 2 (two) times daily. 1 Inhaler 3  . famotidine (PEPCID) 20 MG tablet Take 20 mg by mouth as needed for heartburn or indigestion.    Marland Kitchen guaiFENesin-dextromethorphan (ROBITUSSIN DM) 100-10 MG/5ML syrup Take 5 mLs by mouth every 4 (four) hours as needed for cough.    Marland Kitchen HYDROcodone-homatropine (HYCODAN) 5-1.5 MG/5ML syrup Take 5 mLs by mouth every 8 (eight) hours as needed for cough. 120 mL 0  . triamcinolone (NASACORT) 55 MCG/ACT AERO nasal inhaler Place 1 spray into the nose as needed.    . VENTOLIN HFA 108 (90 Base) MCG/ACT inhaler INHALE 2 PUFFS INTO THE LUNGS EVERY 6 HOURS AS NEEDED FOR WHEEZING. 18 g 3  . azithromycin (ZITHROMAX) 250 MG tablet 2 tabs po qd x 1d, then 1 tab po qd x 4d (Patient not taking: Reported on 07/25/2017) 6 tablet 0  . beclomethasone (QVAR) 80 MCG/ACT inhaler Inhale 2 puffs into the lungs daily. INHALE 2 PUFFS BY MOUTH 2 TIMES DAILY. (Patient not taking: Reported on 07/25/2017) 8.7 g 5  . predniSONE (DELTASONE) 20 MG tablet 2 tabs po qd x 5d, then 1 tab po qd x 5d (Patient not taking: Reported on 07/25/2017) 15 tablet 0  . 0.9 %  sodium chloride infusion      No facility-administered medications prior to visit.     Allergies  Allergen Reactions  . Penicillins Itching  . Ciprofloxacin Itching    ROS As per HPI  PE: Blood pressure (!) 142/89, pulse 83, temperature (!) 97.5 F (36.4 C), temperature source Temporal, resp. rate 16, height 5\' 4"  (1.626 m), weight 235 lb (106.6 kg), last menstrual period 07/13/2017, SpO2 96 %. VS: noted--normal. Gen: alert, NAD, NONTOXIC APPEARING. HEENT: eyes without injection, drainage, or swelling.  Ears: EACs clear, TMs with normal light reflex and landmarks.  Nose: Clear rhinorrhea, with some dried, crusty exudate adherent to mildly injected mucosa.  No purulent d/c.   No paranasal sinus TTP.  No facial swelling.  Throat and mouth without focal lesion.  No pharyngial swelling, erythema, or exudate.   Neck: supple, no LAD.   LUNGS: Occ insp rhonchi diffusely and these clear with coughing.  She has diffuse end-exp wheezing with mild prolongation of expiratory phase, frequent coughing with forced exhalation, nonlabored resps.   CV: RRR, no m/r/g. EXT: no c/c/e SKIN: no rash    LABS:    Chemistry      Component Value Date/Time   NA 138 09/25/2012 0858   K 3.8 09/25/2012 0858   CL 108 09/25/2012 0858   CO2 22 09/25/2012 0858   BUN 9 09/25/2012 0858   CREATININE 0.7 09/25/2012 0858      Component Value Date/Time   CALCIUM 8.9 09/25/2012 0858   ALKPHOS 49 09/25/2012 0858   AST 18 09/25/2012 0858   ALT 17 09/25/2012 0858   BILITOT 0.5 09/25/2012 0858       IMPRESSION AND PLAN:  1) URI with worsening acute bronchitis/acute exacerbation of mild persistent asthma. Z pack. Prednisone 40mg  qd x 5d.  Continue albuterol HFA 2 puffs q6h prn. Get otc generic robitussin DM OR Mucinex DM and use as directed on the packaging for cough and congestion. Use otc generic saline nasal spray 2-3 times per day to irrigate/moisturize your nasal passages. Hydrate.  Rest. Signs/symptoms to call or return for were reviewed and pt expressed understanding.  An After Visit Summary was printed and given to the patient.  FOLLOW UP: Return if symptoms worsen or fail to improve.  Signed:  Crissie Sickles, MD           07/25/2017

## 2017-09-24 MED FILL — QVAR REDIHALER 80 MCG/ACT A: 80 | 30 days supply | Qty: 11 | Fill #3

## 2017-12-02 MED FILL — DOXYCYCLINE HYCLATE 100 MG: 100 | 10 days supply | Qty: 20 | Fill #0

## 2017-12-18 ENCOUNTER — Other Ambulatory Visit: Payer: Self-pay | Admitting: Acute Care

## 2017-12-18 MED FILL — QVAR REDIHALER 80 MCG/ACT A: 80 | 30 days supply | Qty: 11 | Fill #0

## 2018-01-04 DIAGNOSIS — H524 Presbyopia: Secondary | ICD-10-CM | POA: Diagnosis not present

## 2018-02-25 ENCOUNTER — Ambulatory Visit: Payer: 59 | Admitting: Adult Health

## 2018-02-25 ENCOUNTER — Encounter: Payer: Self-pay | Admitting: Adult Health

## 2018-02-25 DIAGNOSIS — J309 Allergic rhinitis, unspecified: Secondary | ICD-10-CM | POA: Diagnosis not present

## 2018-02-25 DIAGNOSIS — J4530 Mild persistent asthma, uncomplicated: Secondary | ICD-10-CM | POA: Diagnosis not present

## 2018-02-25 MED ORDER — VENTOLIN HFA 108 (90 BASE) MCG/ACT IN AERS: INHALATION_SPRAY | RESPIRATORY_TRACT | 5 refills | Status: DC

## 2018-02-25 MED ORDER — BECLOMETHASONE DIPROP HFA 80 MCG/ACT IN AERB: 2.0000 | INHALATION_SPRAY | Freq: Every day | RESPIRATORY_TRACT | 5 refills | Status: DC

## 2018-02-25 MED FILL — QVAR REDIHALER 80 MCG/ACT A: 80 | 60 days supply | Qty: 11 | Fill #0 | Status: TO

## 2018-02-25 MED FILL — VENTOLIN HFA 90 MCG INHALER: 108 (90 BAS | 25 days supply | Qty: 18 | Fill #0

## 2018-02-25 NOTE — Progress Notes (Signed)
@Patient  ID: Donna Willis, female    DOB: [birth year], 57 y.o.   MRN: [id number]  Chief Complaint  Patient presents with  . Follow-up    Asthma     Referring provider: Tammi Sou, MD  HPI: 57 year old female never smoker followed for asthma  02/25/2018 follow up : Asthma  She presents for a follow-up for asthma.  She was last seen in 2017.  Patient says she has been doing very good with her asthma.  She denies any increase of cough or wheezing.  She denies any albuterol use.  She is using her Qvar 2 puffs daily.  Says that her activity level is doing well.  She tries to walk at least 4 times a week.  Allergies  Allergen Reactions  . Penicillins Itching  . Ciprofloxacin Itching    Immunization History  Administered Date(s) Administered  . Influenza Split 08/19/2012, 09/02/2013, 08/02/2014, 08/25/2015, 08/12/2017  . Tdap 04/30/2016    Past Medical History:  Diagnosis Date  . Atypical chest pain 08/2012   Normal Myoview 09/2012  . GERD (gastroesophageal reflux disease)   . Hay fever    seasonal  . Hernia   . 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 Interstitial cystitis    Dr. Rosana Hoes: hasn't seen him in yrs or so, was on elmiron and coldn't tell much difference.  . Mild persistent asthma 08/31/2013   Stable as of pulm f/u 06/2016: no med changes made---1 yr f/u recommended.  . Personal history of  adenomatous colonic polyp 10/28/2012   10/2012 - 4x10 mm tubular adenoma; repeat 09/03/16 showed NO POLYPS.  Recall 5 yrs (08/2021).    Tobacco History: Social History   Tobacco Use  Smoking Status Never Smoker  Smokeless Tobacco Never Used   Counseling given: Not Answered   Outpatient Encounter Medications as of 02/25/2018  Medication Sig  . famotidine (PEPCID) 20 MG tablet Take 20 mg by mouth as needed for heartburn or indigestion.  Marland Kitchen QVAR REDIHALER 80 MCG/ACT inhaler INHALE 2 PUFFS BY MOUTH 2 TIMES DAILY.  Marland Kitchen triamcinolone (NASACORT) 55 MCG/ACT AERO nasal  inhaler Place 1 spray into the nose as needed.  . VENTOLIN HFA 108 (90 Base) MCG/ACT inhaler INHALE 2 PUFFS INTO THE LUNGS EVERY 6 HOURS AS NEEDED FOR WHEEZING.  Marland Kitchen guaiFENesin-dextromethorphan (ROBITUSSIN DM) 100-10 MG/5ML syrup Take 5 mLs by mouth every 4 (four) hours as needed for cough.  Marland Kitchen HYDROcodone-homatropine (HYCODAN) 5-1.5 MG/5ML syrup Take 5 mLs by mouth every 8 (eight) hours as needed for cough. (Patient not taking: Reported on 02/25/2018)  . [DISCONTINUED] azithromycin (ZITHROMAX) 250 MG tablet 2 tabs po qd x 1d, then 1 tab po qd x 4d (Patient not taking: Reported on 02/25/2018)  . [DISCONTINUED] predniSONE (DELTASONE) 20 MG tablet 2 tabs po qd x 5d (Patient not taking: Reported on 02/25/2018)   No facility-administered encounter medications on file as of 02/25/2018.      Review of Systems  Constitutional:   No  weight loss, night sweats,  Fevers, chills, fatigue, or  lassitude.  HEENT:   No headaches,  Difficulty swallowing,  Tooth/dental problems, or  Sore throat,                No sneezing, itching, ear ache, nasal congestion, post nasal drip,   CV:  No chest pain,  Orthopnea, PND, swelling in lower extremities, anasarca, dizziness, palpitations, syncope.   GI  No heartburn, indigestion, abdominal pain, nausea, vomiting, diarrhea, change in bowel habits, loss of appetite,  bloody stools.   Resp: No shortness of breath with exertion or at rest.  No excess mucus, no productive cough,  No non-productive cough,  No coughing up of blood.  No change in color of mucus.  No wheezing.  No chest wall deformity  Skin: no rash or lesions.  GU: no dysuria, change in color of urine, no urgency or frequency.  No flank pain, no hematuria   MS:  No joint pain or swelling.  No decreased range of motion.  No back pain.    Physical Exam  BP 126/84 (BP Location: Right Arm, Cuff Size: Large)   Pulse 95   Ht 5\' 5"  (1.651 m)   Wt 250 lb 6.4 oz (113.6 kg)   SpO2 97%   BMI 41.67 kg/m   GEN:  A/Ox3; pleasant , NAD, well nourished    HEENT:  /AT,  EACs-clear, TMs-wnl, NOSE-clear, THROAT-clear, no lesions, no postnasal drip or exudate noted.   NECK:  Supple w/ fair ROM; no JVD; normal carotid impulses w/o bruits; no thyromegaly or nodules palpated; no lymphadenopathy.    RESP  Clear  P & A; w/o, wheezes/ rales/ or rhonchi. no accessory muscle use, no dullness to percussion  CARD:  RRR, no m/r/g, no peripheral edema, pulses intact, no cyanosis or clubbing.  GI:   Soft & nt; nml bowel sounds; no organomegaly or masses detected.   Musco: Warm bil, no deformities or joint swelling noted.   Neuro: alert, no focal deficits noted.    Skin: Warm, no lesions or rashes    Lab Results:  CBC  No results found for: BNP  ProBNP No results found for: PROBNP  Imaging: No results found.   Assessment & Plan:   Asthma Controlled on current regimen  Plan  Patient Instructions  Continue on Qvar 2 puffs daily, rinse after use May use Ventolin as needed for wheezing. Follow-up with Dr. Halford Chessman in 1 year and as needed     Allergic rhinitis Controlled without flare       Rexene Edison, NP 02/25/2018

## 2018-02-25 NOTE — Addendum Note (Signed)
Addended by: Parke Poisson E on: 02/25/2018 05:08 PM   Modules accepted: Orders

## 2018-02-25 NOTE — Patient Instructions (Signed)
Continue on Qvar 2 puffs daily, rinse after use May use Ventolin as needed for wheezing. Follow-up with Dr. Halford Chessman in 1 year and as needed

## 2018-02-25 NOTE — Assessment & Plan Note (Signed)
Controlled without flare

## 2018-02-25 NOTE — Assessment & Plan Note (Signed)
Controlled on current regimen  Plan  Patient Instructions  Continue on Qvar 2 puffs daily, rinse after use May use Ventolin as needed for wheezing. Follow-up with Dr. Halford Chessman in 1 year and as needed

## 2018-02-26 NOTE — Progress Notes (Signed)
Reviewed and agree with assessment/plan.   Takeysha Bonk, MD [facility] 11/07/2016, 12:24 PM Pager:  [phone]  

## 2018-03-11 ENCOUNTER — Other Ambulatory Visit: Payer: Self-pay

## 2018-03-11 ENCOUNTER — Emergency Department
Admission: EM | Admit: 2018-03-11 | Discharge: 2018-03-11 | Disposition: A | Discharge status: Home / Self Care | Payer: 59 | Source: Home / Self Care

## 2018-03-11 DIAGNOSIS — S81831A Puncture wound without foreign body, right lower leg, initial encounter: Secondary | ICD-10-CM | POA: Diagnosis not present

## 2018-03-11 DIAGNOSIS — W5501XA Bitten by cat, initial encounter: Secondary | ICD-10-CM

## 2018-03-11 MED ORDER — AMOXICILLIN-POT CLAVULANATE 875-125 MG PO TABS: 1.0000 | ORAL_TABLET | Freq: Two times a day (BID) | ORAL | 0 refills | Status: DC

## 2018-03-11 NOTE — Discharge Instructions (Signed)
Recheck here or with your MD in 2 days

## 2018-03-11 NOTE — ED Triage Notes (Signed)
Pt has a cat bite on lower right leg.  Happened last night.  Puncture wounds and scratches.

## 2018-03-12 NOTE — ED Provider Notes (Signed)
[name]    CSN: [id number] Arrival date & time: 03/11/18  1651     History   Chief Complaint Chief Complaint  Patient presents with  . Animal Bite    HPI Donna Willis is a 57 y.o. female.   Pt reports she was bitten by her cat.  Pt complains of a bite on her lower leg.   The history is provided by the patient. No language interpreter was used.  Animal Bite  Contact animal:  Cat Location:  Leg Leg injury location:  R lower leg Pain details:    Quality:  Aching   Severity:  Mild   Past Medical History:  Diagnosis Date  . Atypical chest pain 08/2012   Normal Myoview 09/2012  . GERD (gastroesophageal reflux disease)   . Hay fever    seasonal  . Hernia   . 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 Interstitial cystitis    Dr. Rosana Hoes: hasn't seen him in yrs or so, was on elmiron and coldn't tell much difference.  . Mild persistent asthma 08/31/2013   Stable as of pulm f/u 06/2016: no med changes made---1 yr f/u recommended.  . Personal history of  adenomatous colonic polyp 10/28/2012   10/2012 - 4x10 mm tubular adenoma; repeat 09/03/16 showed NO POLYPS.  Recall 5 yrs (08/2021).    Patient Active Problem List   Diagnosis Date Noted  . Cough 10/25/2015  . Lateral epicondylitis of right elbow 01/21/2015  . Right elbow pain 12/28/2014  . Asthma 08/11/2014  . Thrush 08/11/2014  . Chronic cough 08/31/2013  . Mild intermittent asthma in adult without complication 09/05/8526  . Allergic rhinitis 08/31/2013  . 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 History of colonic polyps 10/28/2012  . Health maintenance examination 09/25/2012  . Atypical chest pain 09/04/2012  . Incarcerated umbilical hernia, repaired 08/19/2012. 08/18/2012    Past Surgical History:  Procedure Laterality Date  . CARDIOVASCULAR STRESS TEST  09/2012   Rest/Stress myoview: normal  . CESAREAN SECTION  1988  . CHOLECYSTECTOMY  2002  . COLONOSCOPY W/ POLYPECTOMY  10/22/12; 09/03/16     2013; tubular adenoma, no high grade dysplasia (Dr. Carlean Purl).  Repeat 08/2016: no polyps.  Recall 5 yrs (08/2021--Dr. Carlean Purl).  . CYSTOSCOPY    . HERNIA REPAIR  7824   umbilical, during lap chole  . TONSILLECTOMY AND ADENOIDECTOMY  1974  . UMBILICAL HERNIA REPAIR  08/19/2012   Procedure: LAPAROSCOPIC UMBILICAL HERNIA;  Surgeon: Shann Medal, MD;  Location: [facility];  Service: General;  Laterality: N/A;    OB History   None      Home Medications    Prior to Admission medications   Medication Sig Start Date End Date Taking? Authorizing Provider  amoxicillin-clavulanate (AUGMENTIN) 875-125 MG tablet Take 1 tablet by mouth every 12 (twelve) hours. 03/11/18   Fransico Meadow, PA-C  beclomethasone (QVAR REDIHALER) 80 MCG/ACT inhaler Inhale 2 puffs into the lungs daily. 02/25/18   Parrett, Fonnie Mu, NP  famotidine (PEPCID) 20 MG tablet Take 20 mg by mouth as needed for heartburn or indigestion.    [provider]  guaiFENesin-dextromethorphan (ROBITUSSIN DM) 100-10 MG/5ML syrup Take 5 mLs by mouth every 4 (four) hours as needed for cough.    [provider]  HYDROcodone-homatropine (HYCODAN) 5-1.5 MG/5ML syrup Take 5 mLs by mouth every 8 (eight) hours as needed for cough. Patient not taking: Reported on 02/25/2018 11/09/16   Tammi Sou, MD  triamcinolone (NASACORT) 55 MCG/ACT AERO  nasal inhaler Place 1 spray into the nose as needed.    [provider]  VENTOLIN HFA 108 (90 Base) MCG/ACT inhaler INHALE 2 PUFFS INTO THE LUNGS EVERY 6 HOURS AS NEEDED FOR WHEEZING. 02/25/18   Parrett, Fonnie Mu, NP    Family History Family History  Problem Relation Age of Onset  . Colon polyps Mother   . Diverticulitis Father   . Other Maternal Uncle        great-stom or colon cancer  . Colon cancer Maternal Uncle   . Rectal cancer Neg Hx     Social History Social History   Tobacco Use  . Smoking status: Never Smoker  . Smokeless tobacco: Never Used  Substance Use  Topics  . Alcohol use: No  . Drug use: No     Allergies   Penicillins and Ciprofloxacin   Review of Systems Review of Systems  All other systems reviewed and are negative.    Physical Exam Triage Vital Signs ED Triage Vitals [03/11/18 1723]  Enc Vitals Group     BP 129/82     Pulse Rate 86     Resp      Temp 98.3 F (36.8 C)     Temp Source Oral     SpO2 97 %     Weight 249 lb (112.9 kg)     Height 5\' 5"  (1.651 m)     Head Circumference      Peak Flow      Pain Score 5     Pain Loc      Pain Edu?      Excl. in [place]?    No data found.  Updated Vital Signs BP 129/82 (BP Location: Right Arm)   Pulse 86   Temp 98.3 F (36.8 C) (Oral)   Ht 5\' 5"  (1.651 m)   Wt 249 lb (112.9 kg)   LMP 02/13/2018   SpO2 97%   BMI 41.44 kg/m   Visual Acuity Right Eye Distance:   Left Eye Distance:   Bilateral Distance:    Right Eye Near:   Left Eye Near:    Bilateral Near:     Physical Exam  Constitutional: She appears well-developed and well-nourished.  HENT:  Head: Normocephalic.  Right Ear: External ear normal.  Left Ear: External ear normal.  Mouth/Throat: Oropharynx is clear and moist.  Eyes: Pupils are equal, round, and reactive to light. EOM are normal.  Neck: Normal range of motion.  Cardiovascular: Normal rate.  Pulmonary/Chest: Effort normal.  Musculoskeletal: Normal range of motion.  4 puncture wounds right lower leg,  Slight erythema around edges  Neurological: She is alert.  Skin: Skin is warm.  Psychiatric: She has a normal mood and affect.  Nursing note and vitals reviewed.    UC Treatments / Results  Labs (all labs ordered are listed, but only abnormal results are displayed) Labs Reviewed - No data to display  EKG None  Radiology No results found.  Procedures Procedures (including critical care time)  Medications Ordered in UC Medications - No data to display  Initial Impression / Assessment and Plan / UC Course  I have reviewed  the triage vital signs and the nursing notes.  Pertinent labs & imaging results that were available during my care of the patient were reviewed by me and considered in my medical decision making (see chart for details).      Final Clinical Impressions(s) / UC Diagnoses   Final diagnoses:  Cat bite, initial  encounter  MDM   Pt counseled on high risk of infection.  Pt advised 2 day recheck,   Discharge Instructions     Recheck here or with your MD in 2 days   ED Prescriptions    Medication Sig Dispense Auth. Provider   amoxicillin-clavulanate (AUGMENTIN) 875-125 MG tablet  (Status: Discontinued) Take 1 tablet by mouth every 12 (twelve) hours. 20 tablet Castor Gittleman K, PA-C   amoxicillin-clavulanate (AUGMENTIN) 875-125 MG tablet Take 1 tablet by mouth every 12 (twelve) hours. 20 tablet Fransico Meadow, [place]     Controlled Substance Prescriptions [place] Controlled Substance Registry consulted? Yes, I have consulted the  Controlled Substances Registry for this patient, and feel the risk/benefit ratio today is favorable for proceeding with this prescription for a controlled substance.   Fransico Meadow, [place] 03/12/18 1622

## 2018-03-13 ENCOUNTER — Ambulatory Visit: Payer: 59 | Admitting: Family Medicine

## 2018-03-13 ENCOUNTER — Telehealth: Payer: Self-pay

## 2018-03-13 ENCOUNTER — Encounter: Payer: Self-pay | Admitting: Family Medicine

## 2018-03-13 VITALS — BP 125/80 | HR 76 | Temp 98.2°F | Resp 16 | Ht 65.0 in | Wt 249.0 lb

## 2018-03-13 DIAGNOSIS — S81851A Open bite, right lower leg, initial encounter: Secondary | ICD-10-CM | POA: Diagnosis not present

## 2018-03-13 DIAGNOSIS — W5501XD Bitten by cat, subsequent encounter: Secondary | ICD-10-CM | POA: Diagnosis not present

## 2018-03-13 MED ORDER — MUPIROCIN 2 % EX OINT: 1.0000 "application " | TOPICAL_OINTMENT | Freq: Three times a day (TID) | CUTANEOUS | 0 refills | Status: DC

## 2018-03-13 MED FILL — MUPIROCIN 2% OINTMENT: 2 | 7 days supply | Qty: 22 | Fill #0

## 2018-03-13 NOTE — Progress Notes (Signed)
OFFICE VISIT  03/13/2018   CC:  Chief Complaint  Patient presents with  . Follow-up    cat bite - right lower leg   HPI:    Patient is a 57 y.o. Caucasian female who presents for f/u cat bit to right lower leg. She was seen at [place] on 03/11/18 for this.  She had 4 puncture wounds present with mild surrounding erythema. She was rx'd augmentin x 10d course. She feels like the bite is looking and feeling better.  Still pretty tender.  Occ sharp burning pain--very fleeting.  Redness has cleared up significantly.  No fevers, chills, or malaise.  The cat has been aggressive/irritable and hard to deal with since they got him 4 yrs ago--no recent change. He is declawed.  He is fixed.  She rescued the cat 4 yrs ago and there was some suspicion that he was feral. He has bitten her a few times.  He is an indoor cat only, he is UTD on rabies vaccinations.   Past Medical History:  Diagnosis Date  . Atypical chest pain 08/2012   Normal Myoview 09/2012  . GERD (gastroesophageal reflux disease)   . Hay fever    seasonal  . Hernia   . 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 Interstitial cystitis    Dr. Rosana Hoes: hasn't seen him in yrs or so, was on elmiron and coldn't tell much difference.  . Mild persistent asthma 08/31/2013   Stable as of pulm f/u 06/2016: no med changes made---1 yr f/u recommended.  . Personal history of  adenomatous colonic polyp 10/28/2012   10/2012 - 4x10 mm tubular adenoma; repeat 09/03/16 showed NO POLYPS.  Recall 5 yrs (08/2021).    Past Surgical History:  Procedure Laterality Date  . CARDIOVASCULAR STRESS TEST  09/2012   Rest/Stress myoview: normal  . CESAREAN SECTION  1988  . CHOLECYSTECTOMY  2002  . COLONOSCOPY W/ POLYPECTOMY  10/22/12; 09/03/16   2013; tubular adenoma, no high grade dysplasia (Dr. Carlean Purl).  Repeat 08/2016: no polyps.  Recall 5 yrs (08/2021--Dr. Carlean Purl).  . CYSTOSCOPY    . HERNIA REPAIR  6295   umbilical, during lap chole   . TONSILLECTOMY AND ADENOIDECTOMY  1974  . UMBILICAL HERNIA REPAIR  08/19/2012   Procedure: LAPAROSCOPIC UMBILICAL HERNIA;  Surgeon: Shann Medal, MD;  Location: [facility];  Service: General;  Laterality: N/A;    Outpatient Medications Prior to Visit  Medication Sig Dispense Refill  . amoxicillin-clavulanate (AUGMENTIN) 875-125 MG tablet Take 1 tablet by mouth every 12 (twelve) hours. 20 tablet 0  . beclomethasone (QVAR REDIHALER) 80 MCG/ACT inhaler Inhale 2 puffs into the lungs daily. 1 Inhaler 5  . famotidine (PEPCID) 20 MG tablet Take 20 mg by mouth as needed for heartburn or indigestion.    . triamcinolone (NASACORT) 55 MCG/ACT AERO nasal inhaler Place 1 spray into the nose as needed.    . VENTOLIN HFA 108 (90 Base) MCG/ACT inhaler INHALE 2 PUFFS INTO THE LUNGS EVERY 6 HOURS AS NEEDED FOR WHEEZING. 18 g 5  . guaiFENesin-dextromethorphan (ROBITUSSIN DM) 100-10 MG/5ML syrup Take 5 mLs by mouth every 4 (four) hours as needed for cough.    Marland Kitchen HYDROcodone-homatropine (HYCODAN) 5-1.5 MG/5ML syrup Take 5 mLs by mouth every 8 (eight) hours as needed for cough. (Patient not taking: Reported on 02/25/2018) 120 mL 0   No facility-administered medications prior to visit.     Allergies  Allergen Reactions  . Penicillins Itching  . Ciprofloxacin Itching  ROS As per HPI  PE: Blood pressure 125/80, pulse 76, temperature 98.2 F (36.8 C), temperature source Oral, resp. rate 16, height 5\' 5"  (1.651 m), weight 249 lb (112.9 kg), last menstrual period 02/13/2018, SpO2 96 %. Gen: Alert, well appearing.  Patient is oriented to person, place, time, and situation. AFFECT: pleasant, lucid thought and speech. Right anterior ankle region with 4 puncture wounds, one with a linear extension/abrasion that is minimally moist. A cm or 2 circumferential erythema around each wound.  No streaking.  Mild TTP over each wound. No exudate.    LABS:    Chemistry      Component Value Date/Time   NA 138 09/25/2012  0858   K 3.8 09/25/2012 0858   CL 108 09/25/2012 0858   CO2 22 09/25/2012 0858   BUN 9 09/25/2012 0858   CREATININE 0.7 09/25/2012 0858      Component Value Date/Time   CALCIUM 8.9 09/25/2012 0858   ALKPHOS 49 09/25/2012 0858   AST 18 09/25/2012 0858   ALT 17 09/25/2012 0858   BILITOT 0.5 09/25/2012 0858       IMPRESSION AND PLAN:  Infected cat bite: improving with augmentin. Add bactroban ointment tid x 10d to each wound. Cover until all wounds dry. She'll get advice from her cat's vet about any possible remedy to its behaviors.    An After Visit Summary was printed and given to the patient.  FOLLOW UP: Return if symptoms worsen or fail to improve.  Signed:  Crissie Sickles, MD           03/13/2018

## 2018-03-13 NOTE — Telephone Encounter (Signed)
Left message to call UC if any questions or concerns.

## 2018-04-02 ENCOUNTER — Encounter: Payer: Self-pay | Admitting: Family Medicine

## 2018-04-23 DIAGNOSIS — Z6841 Body Mass Index (BMI) 40.0 and over, adult: Secondary | ICD-10-CM | POA: Diagnosis not present

## 2018-04-23 DIAGNOSIS — Z1231 Encounter for screening mammogram for malignant neoplasm of breast: Secondary | ICD-10-CM | POA: Diagnosis not present

## 2018-04-23 DIAGNOSIS — Z01419 Encounter for gynecological examination (general) (routine) without abnormal findings: Secondary | ICD-10-CM | POA: Diagnosis not present

## 2018-04-23 LAB — HM PAP SMEAR: HM PAP: NORMAL

## 2018-04-23 LAB — HM MAMMOGRAPHY

## 2018-05-12 DIAGNOSIS — E785 Hyperlipidemia, unspecified: Secondary | ICD-10-CM

## 2018-05-12 HISTORY — DX: Hyperlipidemia, unspecified: E78.5

## 2018-05-29 MED FILL — QVAR REDIHALER 80 MCG/ACT A: 80 | 60 days supply | Qty: 11 | Fill #0

## 2018-05-30 ENCOUNTER — Emergency Department
Admission: EM | Admit: 2018-05-30 | Discharge: 2018-05-30 | Disposition: A | Discharge status: Home / Self Care | Payer: 59 | Source: Home / Self Care

## 2018-05-30 ENCOUNTER — Other Ambulatory Visit: Payer: Self-pay

## 2018-05-30 DIAGNOSIS — S81831A Puncture wound without foreign body, right lower leg, initial encounter: Secondary | ICD-10-CM

## 2018-05-30 DIAGNOSIS — W5501XA Bitten by cat, initial encounter: Secondary | ICD-10-CM | POA: Diagnosis not present

## 2018-05-30 MED ORDER — AMOXICILLIN-POT CLAVULANATE 875-125 MG PO TABS: 1.0000 | ORAL_TABLET | Freq: Two times a day (BID) | ORAL | 0 refills | Status: DC

## 2018-05-30 MED FILL — AMOX-CLAV 875-125 MG TABLET: 875-125 | 10 days supply | Qty: 20 | Fill #0

## 2018-05-30 NOTE — Discharge Instructions (Signed)
Start using Bactroban.  Take Augmentin as directed

## 2018-05-30 NOTE — ED Provider Notes (Signed)
[name]    CSN: [id number] Arrival date & time: 05/30/18  1046     History   Chief Complaint Chief Complaint  Patient presents with  . Animal Bite    HPI Donna Willis is a 57 y.o. female.   The history is provided by the patient. No language interpreter was used.  Animal Bite  Contact animal:  Cat Location:  Leg Leg injury location:  R lower leg Time since incident:  1 day Pain details:    Quality:  Aching   Severity:  Mild   Timing:  Constant Incident location:  Home Provoked: unprovoked   Animal's rabies vaccination status:  Up to date Animal in possession: yes   Tetanus status:  Up to date Relieved by:  Nothing Worsened by:  Nothing Ineffective treatments:  None tried  Pt was bitten by her cat.  Cat's shot are up to date  Past Medical History:  Diagnosis Date  . Atypical chest pain 08/2012   Normal Myoview 09/2012  . GERD (gastroesophageal reflux disease)   . Hay fever    seasonal  . Hernia   . 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 Interstitial cystitis    Dr. Rosana Hoes: hasn't seen him in yrs or so, was on elmiron and coldn't tell much difference.  . Mild persistent asthma 08/31/2013   Stable as of pulm f/u 02/2018: no med changes made---1 yr f/u recommended.  . Personal history of  adenomatous colonic polyp 10/28/2012   10/2012 - 4x10 mm tubular adenoma; repeat 09/03/16 showed NO POLYPS.  Recall 5 yrs (08/2021).    Patient Active Problem List   Diagnosis Date Noted  . Cough 10/25/2015  . Lateral epicondylitis of right elbow 01/21/2015  . Right elbow pain 12/28/2014  . Asthma 08/11/2014  . Thrush 08/11/2014  . Chronic cough 08/31/2013  . Mild intermittent asthma in adult without complication 40/98/1191  . Allergic rhinitis 08/31/2013  . 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 History of colonic polyps 10/28/2012  . Health maintenance examination 09/25/2012  . Atypical chest pain 09/04/2012  . Incarcerated umbilical hernia,  repaired 08/19/2012. 08/18/2012    Past Surgical History:  Procedure Laterality Date  . CARDIOVASCULAR STRESS TEST  09/2012   Rest/Stress myoview: normal  . CESAREAN SECTION  1988  . CHOLECYSTECTOMY  2002  . COLONOSCOPY W/ POLYPECTOMY  10/22/12; 09/03/16   2013; tubular adenoma, no high grade dysplasia (Dr. Carlean Purl).  Repeat 08/2016: no polyps.  Recall 5 yrs (08/2021--Dr. Carlean Purl).  . CYSTOSCOPY    . HERNIA REPAIR  4782   umbilical, during lap chole  . TONSILLECTOMY AND ADENOIDECTOMY  1974  . UMBILICAL HERNIA REPAIR  08/19/2012   Procedure: LAPAROSCOPIC UMBILICAL HERNIA;  Surgeon: Shann Medal, MD;  Location: [facility];  Service: General;  Laterality: N/A;    OB History   None      Home Medications    Prior to Admission medications   Medication Sig Start Date End Date Taking? Authorizing Provider  beclomethasone (QVAR REDIHALER) 80 MCG/ACT inhaler Inhale 2 puffs into the lungs daily. 02/25/18   Parrett, Fonnie Mu, NP  famotidine (PEPCID) 20 MG tablet Take 20 mg by mouth as needed for heartburn or indigestion.    [provider]  mupirocin ointment (BACTROBAN) 2 % Apply 1 application topically 3 (three) times daily. 03/13/18   McGowen, Adrian Blackwater, MD  triamcinolone (NASACORT) 55 MCG/ACT AERO nasal inhaler Place 1 spray into the nose as needed.    [provider]  VENTOLIN HFA 108 (90 Base) MCG/ACT inhaler INHALE 2 PUFFS INTO THE LUNGS EVERY 6 HOURS AS NEEDED FOR WHEEZING. 02/25/18   Parrett, Fonnie Mu, NP    Family History Family History  Problem Relation Age of Onset  . Colon polyps Mother   . Diverticulitis Father   . Other Maternal Uncle        great-stom or colon cancer  . Colon cancer Maternal Uncle   . Rectal cancer Neg Hx     Social History Social History   Tobacco Use  . Smoking status: Never Smoker  . Smokeless tobacco: Never Used  Substance Use Topics  . Alcohol use: No  . Drug use: No     Allergies   Penicillins and Ciprofloxacin   Review  of Systems Review of Systems  Skin: Positive for wound.  All other systems reviewed and are negative.    Physical Exam Triage Vital Signs ED Triage Vitals  Enc Vitals Group     BP 05/30/18 1109 134/83     Pulse Rate 05/30/18 1109 82     Resp --      Temp 05/30/18 1109 98.5 F (36.9 C)     Temp Source 05/30/18 1109 Oral     SpO2 05/30/18 1109 97 %     Weight 05/30/18 1110 252 lb (114.3 kg)     Height 05/30/18 1110 5\' 5"  (1.651 m)     Head Circumference --      Peak Flow --      Pain Score 05/30/18 1110 3     Pain Loc --      Pain Edu? --      Excl. in [place]? --    No data found.  Updated Vital Signs BP 134/83 (BP Location: Right Arm)   Pulse 82   Temp 98.5 F (36.9 C) (Oral)   Ht 5\' 5"  (1.651 m)   Wt 252 lb (114.3 kg)   LMP 05/17/2018   SpO2 97%   BMI 41.93 kg/m   Visual Acuity Right Eye Distance:   Left Eye Distance:   Bilateral Distance:    Right Eye Near:   Left Eye Near:    Bilateral Near:     Physical Exam  Constitutional: She appears well-developed and well-nourished.  Musculoskeletal: She exhibits tenderness.  5 puncture wounds back of leg, no sign of infection  Neurological: She is alert.  Skin: Skin is warm.  Psychiatric: She has a normal mood and affect.  Nursing note and vitals reviewed.    UC Treatments / Results  Labs (all labs ordered are listed, but only abnormal results are displayed) Labs Reviewed - No data to display  EKG None  Radiology No results found.  Procedures Procedures (including critical care time)  Medications Ordered in UC Medications - No data to display  Initial Impression / Assessment and Plan / UC Course  I have reviewed the triage vital signs and the nursing notes.  Pertinent labs & imaging results that were available during my care of the patient were reviewed by me and considered in my medical decision making (see chart for details).     Pt counseled on risk of infection.,  Pt will see her Primary on  Tuesday Final Clinical Impressions(s) / UC Diagnoses   Final diagnoses:  Cat bite, initial encounter     Discharge Instructions     Start using Bactroban.  Take Augmentin as directed   ED Prescriptions    None  Controlled Substance Prescriptions [place] Controlled Substance Registry consulted? Not Applicable   Fransico Meadow, [place] 05/30/18 1132

## 2018-05-30 NOTE — ED Triage Notes (Signed)
Pt has a cat bite to back of lower right leg.  Cleaned with wound wash and antibiotic ointment last night.  4 puncture wounds and redness noted around it.

## 2018-06-03 ENCOUNTER — Ambulatory Visit (INDEPENDENT_AMBULATORY_CARE_PROVIDER_SITE_OTHER): Payer: 59 | Admitting: Family Medicine

## 2018-06-03 ENCOUNTER — Encounter: Payer: Self-pay | Admitting: Family Medicine

## 2018-06-03 VITALS — BP 136/81 | HR 77 | Temp 98.2°F | Resp 16 | Ht 65.0 in | Wt 253.1 lb

## 2018-06-03 DIAGNOSIS — W5501XD Bitten by cat, subsequent encounter: Secondary | ICD-10-CM

## 2018-06-03 DIAGNOSIS — F321 Major depressive disorder, single episode, moderate: Secondary | ICD-10-CM

## 2018-06-03 DIAGNOSIS — Z9189 Other specified personal risk factors, not elsewhere classified: Secondary | ICD-10-CM

## 2018-06-03 DIAGNOSIS — Z0000 Encounter for general adult medical examination without abnormal findings: Secondary | ICD-10-CM

## 2018-06-03 DIAGNOSIS — Z1159 Encounter for screening for other viral diseases: Secondary | ICD-10-CM

## 2018-06-03 DIAGNOSIS — S91051D Open bite, right ankle, subsequent encounter: Secondary | ICD-10-CM

## 2018-06-03 DIAGNOSIS — Z23 Encounter for immunization: Secondary | ICD-10-CM

## 2018-06-03 LAB — COMPREHENSIVE METABOLIC PANEL
ALBUMIN: 4.1 g/dL (ref 3.5–5.2)
ALK PHOS: 53 U/L (ref 39–117)
ALT: 19 U/L (ref 0–35)
AST: 14 U/L (ref 0–37)
BILIRUBIN TOTAL: 0.5 mg/dL (ref 0.2–1.2)
BUN: 10 mg/dL (ref 6–23)
CO2: 28 mEq/L (ref 19–32)
CREATININE: 0.73 mg/dL (ref 0.40–1.20)
Calcium: 9.3 mg/dL (ref 8.4–10.5)
Chloride: 104 mEq/L (ref 96–112)
GFR: 87.54 mL/min (ref >60.00)
Glucose, Bld: 107 mg/dL — ABNORMAL HIGH (ref 70–99)
POTASSIUM: 4.2 meq/L (ref 3.5–5.1)
SODIUM: 140 meq/L (ref 135–145)
TOTAL PROTEIN: 7.3 g/dL (ref 6.0–8.3)

## 2018-06-03 LAB — CBC WITH DIFFERENTIAL/PLATELET
BASOS PCT: 0.7 % (ref 0.0–3.0)
Basophils Absolute: 0 10*3/uL (ref 0.0–0.1)
EOS ABS: 0.2 10*3/uL (ref 0.0–0.7)
Eosinophils Relative: 3.2 % (ref 0.0–5.0)
HCT: 41.2 % (ref 36.0–46.0)
HEMOGLOBIN: 13.6 g/dL (ref 12.0–15.0)
Lymphocytes Relative: 19.7 % (ref 12.0–46.0)
Lymphs Abs: 1.2 10*3/uL (ref 0.7–4.0)
MCHC: 32.9 g/dL (ref 30.0–36.0)
MCV: 87.1 fl (ref 78.0–100.0)
MONO ABS: 0.5 10*3/uL (ref 0.1–1.0)
Monocytes Relative: 7.7 % (ref 3.0–12.0)
NEUTROS PCT: 68.7 % (ref 43.0–77.0)
Neutro Abs: 4.3 10*3/uL (ref 1.4–7.7)
Platelets: 269 10*3/uL (ref 150.0–400.0)
RBC: 4.73 Mil/uL (ref 3.87–5.11)
RDW: 13.9 % (ref 11.5–15.5)
WBC: 6.2 10*3/uL (ref 4.0–10.5)

## 2018-06-03 LAB — LIPID PANEL
CHOLESTEROL: 228 mg/dL — AB (ref 0–200)
HDL: 52.9 mg/dL (ref >39.00)
LDL Cholesterol: 146 mg/dL — ABNORMAL HIGH (ref 0–99)
NonHDL: 174.84
Total CHOL/HDL Ratio: 4
Triglycerides: 142 mg/dL (ref 0.0–149.0)
VLDL: 28.4 mg/dL (ref 0.0–40.0)

## 2018-06-03 LAB — TSH: TSH: 1.81 u[IU]/mL (ref 0.35–4.50)

## 2018-06-03 MED ORDER — FLUOXETINE HCL 20 MG PO TABS: 20.0000 mg | ORAL_TABLET | Freq: Every day | ORAL | 1 refills | Status: DC

## 2018-06-03 MED FILL — FLUoxetine HCL 20 MG TABS: 20 | 30 days supply | Qty: 30 | Fill #0

## 2018-06-03 NOTE — Addendum Note (Signed)
Addended by: Onalee Hua on: 06/03/2018 10:27 AM   Modules accepted: Orders

## 2018-06-03 NOTE — Progress Notes (Signed)
Office Note 06/03/2018  CC:  Chief Complaint  Patient presents with  . Annual Exam    Pt is fasting.     HPI:  Donna Willis is a 57 y.o. White female who is here for annual health maintenance exam. GYN MD:  Dr. Evette Cristal.  Says she feels signif depressed x months.  Her husband's myotonic dystrophy is causing him more disability so she is distraught about this.  Level of anxiety fairly high.  No panic.  + anhedonia.  Sleep impaired, overeating. No isolating.  No self medicating.  No SI or HI.  Poor energy level.  Crying spells. Thinking about counseling through her employer's services. Has never been on antidepressant in the past but wants to start one now.   Past Medical History:  Diagnosis Date  . Atypical chest pain 08/2012   Normal Myoview 09/2012  . GERD (gastroesophageal reflux disease)   . Hay fever    seasonal  . Hernia   . 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 Interstitial cystitis    Dr. Rosana Hoes: hasn't seen him in yrs or so, was on elmiron and coldn't tell much difference.  . Mild persistent asthma 08/31/2013   Stable as of pulm f/u 02/2018: no med changes made---1 yr f/u recommended.  . Personal history of  adenomatous colonic polyp 10/28/2012   10/2012 - 4x10 mm tubular adenoma; repeat 09/03/16 showed NO POLYPS.  Recall 5 yrs (08/2021).    Past Surgical History:  Procedure Laterality Date  . CARDIOVASCULAR STRESS TEST  09/2012   Rest/Stress myoview: normal  . CESAREAN SECTION  1988  . CHOLECYSTECTOMY  2002  . COLONOSCOPY W/ POLYPECTOMY  10/22/12; 09/03/16   2013; tubular adenoma, no high grade dysplasia (Dr. Carlean Purl).  Repeat 08/2016: no polyps.  Recall 5 yrs (08/2021--Dr. Carlean Purl).  . CYSTOSCOPY    . HERNIA REPAIR  5027   umbilical, during lap chole  . TONSILLECTOMY AND ADENOIDECTOMY  1974  . UMBILICAL HERNIA REPAIR  08/19/2012   Procedure: LAPAROSCOPIC UMBILICAL HERNIA;  Surgeon: Shann Medal, MD;  Location: [facility];  Service: General;   Laterality: N/A;    Family History  Problem Relation Age of Onset  . Colon polyps Mother   . Diverticulitis Father   . Other Maternal Uncle        great-stom or colon cancer  . Colon cancer Maternal Uncle   . Rectal cancer Neg Hx     Social History   Socioeconomic History  . Marital status: Married    Spouse name: Not on file  . Number of children: Not on file  . Years of education: Not on file  . Highest education level: Not on file  Occupational History  . Not on file  Social Needs  . Financial resource strain: Not on file  . Food insecurity:    Worry: Not on file    Inability: Not on file  . Transportation needs:    Medical: Not on file    Non-medical: Not on file  Tobacco Use  . Smoking status: Never Smoker  . Smokeless tobacco: Never Used  Substance and Sexual Activity  . Alcohol use: No  . Drug use: No  . Sexual activity: Never  Lifestyle  . Physical activity:    Days per week: Not on file    Minutes per session: Not on file  . Stress: Not on file  Relationships  . Social connections:    Talks on phone: Not on file  Gets together: Not on file    Attends religious service: Not on file    Active member of club or organization: Not on file    Attends meetings of clubs or organizations: Not on file    Relationship status: Not on file  . Intimate partner violence:    Fear of current or ex partner: Not on file    Emotionally abused: Not on file    Physically abused: Not on file    Forced sexual activity: Not on file  Other Topics Concern  . Not on file  Social History Narrative   Married, 1 son.   Orig from [place] but has lived in [place] >20 yrs.   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   Exercise: walks 40 min 3X/week.     No T/A/Ds    Outpatient Medications Prior to Visit  Medication Sig Dispense Refill  . amoxicillin-clavulanate (AUGMENTIN) 875-125 MG tablet Take 1 tablet by mouth every 12 (twelve)  hours. 20 tablet 0  . beclomethasone (QVAR REDIHALER) 80 MCG/ACT inhaler Inhale 2 puffs into the lungs daily. 1 Inhaler 5  . famotidine (PEPCID) 20 MG tablet Take 20 mg by mouth as needed for heartburn or indigestion.    . mupirocin ointment (BACTROBAN) 2 % Apply 1 application topically 3 (three) times daily. 15 g 0  . triamcinolone (NASACORT) 55 MCG/ACT AERO nasal inhaler Place 1 spray into the nose as needed.    . VENTOLIN HFA 108 (90 Base) MCG/ACT inhaler INHALE 2 PUFFS INTO THE LUNGS EVERY 6 HOURS AS NEEDED FOR WHEEZING. 18 g 5   No facility-administered medications prior to visit.     Allergies  Allergen Reactions  . Penicillins Itching  . Ciprofloxacin Itching    ROS Review of Systems  Constitutional: Negative for appetite change, chills, fatigue and fever.  HENT: Negative for congestion, dental problem, ear pain and sore throat.   Eyes: Negative for discharge, redness and visual disturbance.  Respiratory: Negative for cough, chest tightness, shortness of breath and wheezing.   Cardiovascular: Negative for chest pain, palpitations and leg swelling.  Gastrointestinal: Negative for abdominal pain, blood in stool, diarrhea, nausea and vomiting.  Genitourinary: Negative for difficulty urinating, dysuria, flank pain, frequency, hematuria and urgency.  Musculoskeletal: Negative for arthralgias, back pain, joint swelling, myalgias and neck stiffness.  Skin: Negative for pallor and rash.       R ankle with 4 puncture wounds from recent cat bite   Neurological: Negative for dizziness, speech difficulty, weakness and headaches.  Hematological: Negative for adenopathy. Does not bruise/bleed easily.  Psychiatric/Behavioral: Positive for decreased concentration, dysphoric mood and sleep disturbance. Negative for confusion. The patient is nervous/anxious.     PE; Blood pressure 136/81, pulse 77, temperature 98.2 F (36.8 C), temperature source Oral, resp. rate 16, height 5\' 5"  (1.651 m),  weight 253 lb 2 oz (114.8 kg), last menstrual period 05/17/2018, SpO2 97 %. Gen: Alert, well appearing.  Patient is oriented to person, place, time, and situation. AFFECT: pleasant, lucid thought and speech. ENT: Ears: EACs clear, normal epithelium.  TMs with good light reflex and landmarks bilaterally.  Eyes: no injection, icteris, swelling, or exudate.  EOMI, PERRLA. Nose: no drainage or turbinate edema/swelling.  No injection or focal lesion.  Mouth: lips without lesion/swelling.  Oral mucosa pink and moist.  Dentition intact and without obvious caries or gingival swelling.  Oropharynx without erythema, exudate, or swelling.  Neck: supple/nontender.  No LAD, mass,  or TM.  Carotid pulses 2+ bilaterally, without bruits. CV: RRR, no m/r/g.   LUNGS: CTA bilat, nonlabored resps, good aeration in all lung fields. ABD: soft, NT, ND, BS normal.  No hepatospenomegaly or mass.  No bruits. EXT: no clubbing, cyanosis, or edema.  Musculoskeletal: no joint swelling, erythema, warmth, or tenderness.  ROM of all joints intact. Skin - no sores or suspicious lesions or rashes or color changes R ankle with 4 puncture wounds with some minimal surrounding violacious hue c/w inflammation/bruising.  No exudate or swelling or streaking.   Pertinent labs:  Lab Results  Component Value Date   TSH 1.23 09/25/2012   Lab Results  Component Value Date   WBC 6.6 08/19/2012   HGB 11.9 (L) 08/19/2012   HCT 35.8 (L) 08/19/2012   MCV 84.0 08/19/2012   PLT 215 08/19/2012   Lab Results  Component Value Date   CREATININE 0.7 09/25/2012   BUN 9 09/25/2012   NA 138 09/25/2012   K 3.8 09/25/2012   CL 108 09/25/2012   CO2 22 09/25/2012   Lab Results  Component Value Date   ALT 17 09/25/2012   AST 18 09/25/2012   ALKPHOS 49 09/25/2012   BILITOT 0.5 09/25/2012   Lab Results  Component Value Date   CHOL 230 (H) 09/25/2012   Lab Results  Component Value Date   HDL 41.00 09/25/2012   No results found for:  [facility] Lab Results  Component Value Date   TRIG 163.0 (H) 09/25/2012   Lab Results  Component Value Date   CHOLHDL 6 09/25/2012     ASSESSMENT AND PLAN:  1) MDD: start trial of fluoxetine 20mg  qd.  Encouraged her to seek counseling. Therapeutic expectations and side effect profile of medication discussed today.  Patient's questions answered.  2) Health maintenance exam: Reviewed age and gender appropriate health maintenance issues (prudent diet, regular exercise, health risks of tobacco and excessive alcohol, use of seatbelts, fire alarms in home, use of sunscreen).  Also reviewed age and gender appropriate health screening as well as vaccine recommendations. Vaccines: UTD. Shingrix discussed--#1 today. Labs: fasting HP labs today + Hep C screening. Cervical ca screening: no ecidence of pap/pelvic in EMR.  She states she had pap /pelvic 04/23/18--> normal. Breast ca screening: no evidence of mammogram in EMR.  She states she had mammogram 04/23/18--> pt has not heard results yet. Colon ca screening: hx of polyps-->recall 08/2021.   3) Cat bite R ankle: no sign of infection. Healing fine. Finish augmentin and bactroban.   An After Visit Summary was printed and given to the patient.  FOLLOW UP:  Return in about 1 month (around 07/01/2018) for f/u depression.  Signed:  Crissie Sickles, MD           06/03/2018

## 2018-06-03 NOTE — Patient Instructions (Signed)

## 2018-06-05 ENCOUNTER — Encounter: Payer: Self-pay | Admitting: Family Medicine

## 2018-06-05 LAB — HEPATITIS C ANTIBODY
Hepatitis C Ab: NONREACTIVE
SIGNAL TO CUT-OFF: 0.08 (ref <1.00)

## 2018-06-06 ENCOUNTER — Encounter: Payer: Self-pay | Admitting: Family Medicine

## 2018-06-06 DIAGNOSIS — R310 Gross hematuria: Secondary | ICD-10-CM | POA: Diagnosis not present

## 2018-06-06 DIAGNOSIS — R828 Abnormal findings on cytological and histological examination of urine: Secondary | ICD-10-CM | POA: Diagnosis not present

## 2018-06-06 DIAGNOSIS — N3010 Interstitial cystitis (chronic) without hematuria: Secondary | ICD-10-CM | POA: Diagnosis not present

## 2018-07-01 ENCOUNTER — Encounter: Payer: Self-pay | Admitting: Family Medicine

## 2018-07-01 ENCOUNTER — Ambulatory Visit: Payer: 59 | Admitting: Family Medicine

## 2018-07-01 VITALS — BP 131/81 | HR 62 | Temp 98.2°F | Resp 16 | Ht 65.0 in | Wt 251.2 lb

## 2018-07-01 DIAGNOSIS — Z23 Encounter for immunization: Secondary | ICD-10-CM | POA: Diagnosis not present

## 2018-07-01 DIAGNOSIS — F324 Major depressive disorder, single episode, in partial remission: Secondary | ICD-10-CM | POA: Diagnosis not present

## 2018-07-01 MED ORDER — FLUOXETINE HCL 20 MG PO TABS: 20.0000 mg | ORAL_TABLET | Freq: Every day | ORAL | 1 refills | Status: DC

## 2018-07-01 MED FILL — FLUoxetine HCL 20 MG TABS: 20 | 90 days supply | Qty: 90 | Fill #0

## 2018-07-01 NOTE — Progress Notes (Signed)
OFFICE VISIT  07/01/2018   CC:  Chief Complaint  Patient presents with  . Follow-up    Depression   HPI:    Patient is a 57 y.o. Caucasian female who presents for 1 mo f/u MDD, first known episode. Started fluoxetine 20mg  qd last visit. DOING A LOT BETTER! Much improved mood, much less hopeless, crying spells are much less. Overeating is not as bad, sleep is improved.   Minimal lightheaded feeling occ since getting on the med. Anxiety level improved as well.    Past Medical History:  Diagnosis Date  . Atypical chest pain 08/2012   Normal Myoview 09/2012  . Borderline hyperlipidemia 05/2018  . GERD (gastroesophageal reflux disease)   . Hay fever    seasonal  . Hernia   . 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 Interstitial cystitis    Dr. Rosana Hoes: hasn't seen him in yrs or so, was on elmiron and coldn't tell much difference.  . Mild persistent asthma 08/31/2013   Stable as of pulm f/u 02/2018: no med changes made---1 yr f/u recommended.  . Personal history of  adenomatous colonic polyp 10/28/2012   10/2012 - 4x10 mm tubular adenoma; repeat 09/03/16 showed NO POLYPS.  Recall 5 yrs (08/2021).    Past Surgical History:  Procedure Laterality Date  . CARDIOVASCULAR STRESS TEST  09/2012   Rest/Stress myoview: normal  . CESAREAN SECTION  1988  . CHOLECYSTECTOMY  2002  . COLONOSCOPY W/ POLYPECTOMY  10/22/12; 09/03/16   2013; tubular adenoma, no high grade dysplasia (Dr. Carlean Purl).  Repeat 08/2016: no polyps.  Recall 5 yrs (08/2021--Dr. Carlean Purl).  . CYSTOSCOPY    . HERNIA REPAIR  8850   umbilical, during lap chole  . TONSILLECTOMY AND ADENOIDECTOMY  1974  . UMBILICAL HERNIA REPAIR  08/19/2012   Procedure: LAPAROSCOPIC UMBILICAL HERNIA;  Surgeon: Shann Medal, MD;  Location: [facility];  Service: General;  Laterality: N/A;    Outpatient Medications Prior to Visit  Medication Sig Dispense Refill  . beclomethasone (QVAR REDIHALER) 80 MCG/ACT inhaler Inhale 2 puffs into the lungs  daily. 1 Inhaler 5  . famotidine (PEPCID) 20 MG tablet Take 20 mg by mouth as needed for heartburn or indigestion.    . triamcinolone (NASACORT) 55 MCG/ACT AERO nasal inhaler Place 1 spray into the nose as needed.    . VENTOLIN HFA 108 (90 Base) MCG/ACT inhaler INHALE 2 PUFFS INTO THE LUNGS EVERY 6 HOURS AS NEEDED FOR WHEEZING. 18 g 5  . amoxicillin-clavulanate (AUGMENTIN) 875-125 MG tablet Take 1 tablet by mouth every 12 (twelve) hours. 20 tablet 0  . FLUoxetine (PROZAC) 20 MG tablet Take 1 tablet (20 mg total) by mouth daily. 30 tablet 1  . mupirocin ointment (BACTROBAN) 2 % Apply 1 application topically 3 (three) times daily. 15 g 0  . ibuprofen (ADVIL,MOTRIN) 200 MG tablet Take 1 tablet by mouth every 6 (six) hours as needed.     No facility-administered medications prior to visit.     Allergies  Allergen Reactions  . Penicillins Itching  . Ciprofloxacin Itching    ROS As per HPI  PE: Blood pressure 131/81, pulse 62, temperature 98.2 F (36.8 C), temperature source Oral, resp. rate 16, height 5\' 5"  (1.651 m), weight 251 lb 4 oz (114 kg), last menstrual period 06/19/2018, SpO2 97 %. Body mass index is 41.81 kg/m.  Gen: Alert, well appearing.  Patient is oriented to person, place, time, and situation. AFFECT: pleasant, lucid thought and speech. No further exam today.  LABS:    Chemistry      Component Value Date/Time   NA 140 06/03/2018 1016   K 4.2 06/03/2018 1016   CL 104 06/03/2018 1016   CO2 28 06/03/2018 1016   BUN 10 06/03/2018 1016   CREATININE 0.73 06/03/2018 1016      Component Value Date/Time   CALCIUM 9.3 06/03/2018 1016   ALKPHOS 53 06/03/2018 1016   AST 14 06/03/2018 1016   ALT 19 06/03/2018 1016   BILITOT 0.5 06/03/2018 1016      IMPRESSION AND PLAN:  MDD, in partial remission. Great response to fluoxetine 20mg  qd. Continue current dosing, recheck in 2 mo to see if continues to improve. Discussed plan to stay on med for 9-12 mo after remission  before considering ween off of med.  An After Visit Summary was printed and given to the patient.  FOLLOW UP: Return in about 2 months (around 08/31/2018) for f/u MDD.  Signed:  Crissie Sickles, MD           07/01/2018

## 2018-07-04 DIAGNOSIS — N3941 Urge incontinence: Secondary | ICD-10-CM | POA: Diagnosis not present

## 2018-07-04 DIAGNOSIS — R82998 Other abnormal findings in urine: Secondary | ICD-10-CM | POA: Diagnosis not present

## 2018-07-04 DIAGNOSIS — N3289 Other specified disorders of bladder: Secondary | ICD-10-CM | POA: Diagnosis not present

## 2018-07-04 DIAGNOSIS — N3010 Interstitial cystitis (chronic) without hematuria: Secondary | ICD-10-CM | POA: Diagnosis not present

## 2018-07-04 DIAGNOSIS — R310 Gross hematuria: Secondary | ICD-10-CM | POA: Diagnosis not present

## 2018-07-04 DIAGNOSIS — B952 Enterococcus as the cause of diseases classified elsewhere: Secondary | ICD-10-CM | POA: Diagnosis not present

## 2018-07-08 ENCOUNTER — Other Ambulatory Visit: Payer: Self-pay | Admitting: *Deleted

## 2018-07-08 MED ORDER — FLUOXETINE HCL 20 MG PO CAPS: 20.0000 mg | ORAL_CAPSULE | Freq: Every day | ORAL | 1 refills | Status: DC

## 2018-07-08 MED FILL — NITROFURANTOIN MONO-MCR 100: 100 | 7 days supply | Qty: 14 | Fill #0

## 2018-07-08 NOTE — Telephone Encounter (Signed)
Pharmacy sent fax requesting to change fluoxetine tab to fluoxetine capsule due to price.   SW Dr. Anitra Lauth okay to change. Rx sent for capsules.

## 2018-07-22 DIAGNOSIS — N329 Bladder disorder, unspecified: Secondary | ICD-10-CM | POA: Diagnosis not present

## 2018-07-22 DIAGNOSIS — N3289 Other specified disorders of bladder: Secondary | ICD-10-CM | POA: Diagnosis not present

## 2018-07-22 DIAGNOSIS — N3011 Interstitial cystitis (chronic) with hematuria: Secondary | ICD-10-CM | POA: Diagnosis not present

## 2018-07-22 DIAGNOSIS — N3020 Other chronic cystitis without hematuria: Secondary | ICD-10-CM | POA: Diagnosis not present

## 2018-08-05 MED FILL — QVAR REDIHALER 80 MCG/ACT A: 80 | 60 days supply | Qty: 11 | Fill #1

## 2018-08-07 ENCOUNTER — Ambulatory Visit: Payer: 59

## 2018-08-20 ENCOUNTER — Ambulatory Visit: Payer: 59 | Admitting: Family Medicine

## 2018-08-27 ENCOUNTER — Other Ambulatory Visit: Payer: Self-pay

## 2018-08-27 ENCOUNTER — Ambulatory Visit (INDEPENDENT_AMBULATORY_CARE_PROVIDER_SITE_OTHER): Payer: 59 | Admitting: Family Medicine

## 2018-08-27 ENCOUNTER — Encounter: Payer: Self-pay | Admitting: Family Medicine

## 2018-08-27 VITALS — BP 139/84 | HR 73 | Temp 98.1°F | Resp 16 | Ht 65.0 in | Wt 253.0 lb

## 2018-08-27 DIAGNOSIS — F324 Major depressive disorder, single episode, in partial remission: Secondary | ICD-10-CM | POA: Diagnosis not present

## 2018-08-27 DIAGNOSIS — Z23 Encounter for immunization: Secondary | ICD-10-CM | POA: Diagnosis not present

## 2018-08-27 MED ORDER — FLUOXETINE HCL 40 MG PO CAPS: 40.0000 mg | ORAL_CAPSULE | Freq: Every day | ORAL | 1 refills | Status: DC

## 2018-08-27 MED FILL — FLUoxetine HCL 40 MG CAPS: 40 | 30 days supply | Qty: 30 | Fill #0

## 2018-08-27 NOTE — Progress Notes (Signed)
OFFICE VISIT  08/27/2018   CC:  Chief Complaint  Patient presents with  . Depression     HPI:    Patient is a 57 y.o. Caucasian female who presents for 2 mo f/u MDD. Last visit she was significantly improved after being on fluoxetine 20mg  qd for 1 month.  Interim hx: She does not feel as good as she did at last f/u: having some crying spells, some trouble maintaining sleep, mind is fuzzy sometimes/concentration not as good. Eating too much.  No SI or HI. Trouble coping with stress more lately.   Side effect: question of whether it is causing some impaired short term memory lately.   Past Medical History:  Diagnosis Date  . Atypical chest pain 08/2012   Normal Myoview 09/2012  . Borderline hyperlipidemia 05/2018  . GERD (gastroesophageal reflux disease)   . Hay fever    seasonal  . Hernia   . 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 Interstitial cystitis    Dr. Rosana Hoes: hasn't seen him in yrs or so, was on elmiron and coldn't tell much difference.  . Mild persistent asthma 08/31/2013   Stable as of pulm f/u 02/2018: no med changes made---1 yr f/u recommended.  . Personal history of  adenomatous colonic polyp 10/28/2012   10/2012 - 4x10 mm tubular adenoma; repeat 09/03/16 showed NO POLYPS.  Recall 5 yrs (08/2021).    Past Surgical History:  Procedure Laterality Date  . CARDIOVASCULAR STRESS TEST  09/2012   Rest/Stress myoview: normal  . CESAREAN SECTION  1988  . CHOLECYSTECTOMY  2002  . COLONOSCOPY W/ POLYPECTOMY  10/22/12; 09/03/16   2013; tubular adenoma, no high grade dysplasia (Dr. Carlean Purl).  Repeat 08/2016: no polyps.  Recall 5 yrs (08/2021--Dr. Carlean Purl).  . CYSTOSCOPY     with bladder bx 07/22/18-->  . HERNIA REPAIR  3716   umbilical, during lap chole  . TONSILLECTOMY AND ADENOIDECTOMY  1974  . UMBILICAL HERNIA REPAIR  08/19/2012   Procedure: LAPAROSCOPIC UMBILICAL HERNIA;  Surgeon: Shann Medal, MD;  Location: [facility];  Service: General;  Laterality: N/A;     Outpatient Medications Prior to Visit  Medication Sig Dispense Refill  . beclomethasone (QVAR REDIHALER) 80 MCG/ACT inhaler Inhale 2 puffs into the lungs daily. 1 Inhaler 5  . famotidine (PEPCID) 20 MG tablet Take 20 mg by mouth as needed for heartburn or indigestion.    Marland Kitchen ibuprofen (ADVIL,MOTRIN) 200 MG tablet Take 1 tablet by mouth every 6 (six) hours as needed.    . triamcinolone (NASACORT) 55 MCG/ACT AERO nasal inhaler Place 1 spray into the nose as needed.    . VENTOLIN HFA 108 (90 Base) MCG/ACT inhaler INHALE 2 PUFFS INTO THE LUNGS EVERY 6 HOURS AS NEEDED FOR WHEEZING. 18 g 5  . FLUoxetine (PROZAC) 20 MG capsule Take 1 capsule (20 mg total) by mouth daily. 90 capsule 1   No facility-administered medications prior to visit.     Allergies  Allergen Reactions  . Penicillins Itching  . Ciprofloxacin Itching    ROS As per HPI  PE: Blood pressure 139/84, pulse 73, temperature 98.1 F (36.7 C), temperature source Oral, resp. rate 16, height 5\' 5"  (1.651 m), weight 253 lb (114.8 kg), SpO2 96 %. Gen: Alert, well appearing.  Patient is oriented to person, place, time, and situation. AFFECT: pleasant, lucid thought and speech. No further exam today.  LABS:    Chemistry      Component Value Date/Time   NA 140 06/03/2018 1016  K 4.2 06/03/2018 1016   CL 104 06/03/2018 1016   CO2 28 06/03/2018 1016   BUN 10 06/03/2018 1016   CREATININE 0.73 06/03/2018 1016      Component Value Date/Time   CALCIUM 9.3 06/03/2018 1016   ALKPHOS 53 06/03/2018 1016   AST 14 06/03/2018 1016   ALT 19 06/03/2018 1016   BILITOT 0.5 06/03/2018 1016       IMPRESSION AND PLAN:  MDD, in partial remission.  Unfortunately she has gone backwards a bit from her initial improvement. Her well being mirrors her husband's (who has chronic physical/mental health problems and she is his primary caregiver). Increase fluoxetine to 40 mg qd. Increase exercise: goal of going to the [facility] 1 day per week over  the next month. She is going to seek counseling through [facility] employer [employer] services.  An After Visit Summary was printed and given to the patient.  FOLLOW UP: Return in about 4 weeks (around 09/24/2018) for f/u mood/anx.  Signed:  Crissie Sickles, MD           08/27/2018

## 2018-08-27 NOTE — Addendum Note (Signed)
Addended by: Leonidas Romberg on: 08/27/2018 04:25 PM   Modules accepted: Orders

## 2018-10-01 ENCOUNTER — Ambulatory Visit: Payer: 59 | Admitting: Family Medicine

## 2018-10-04 ENCOUNTER — Encounter: Payer: Self-pay | Admitting: Family

## 2018-10-04 ENCOUNTER — Ambulatory Visit (INDEPENDENT_AMBULATORY_CARE_PROVIDER_SITE_OTHER): Payer: Self-pay | Admitting: Family

## 2018-10-04 VITALS — BP 126/80 | HR 88 | Temp 98.3°F | Wt 257.8 lb

## 2018-10-04 DIAGNOSIS — W5501XA Bitten by cat, initial encounter: Secondary | ICD-10-CM

## 2018-10-04 MED ORDER — AMOXICILLIN-POT CLAVULANATE 875-125 MG PO TABS: 1.0000 | ORAL_TABLET | Freq: Two times a day (BID) | ORAL | 0 refills | Status: DC

## 2018-10-04 NOTE — Patient Instructions (Signed)
Animal Bite Animal bites can range from mild to serious. An animal bite can result in a scratch on the skin, a deep open cut, a puncture of the skin, a crush injury, or tearing away of the skin or a body part. A small bite from a house pet will usually not cause serious problems. However, some animal bites can become infected or injure a bone or other tissue. Bites from certain animals can be more dangerous because of the risk of spreading rabies, which is a serious viral infection. This risk is higher with bites from stray animals or wild animals, such as raccoons, foxes, skunks, and bats. Dogs are responsible for most animal bites. Children are bitten more often than adults. What are the signs or symptoms? Common symptoms of an animal bite include:  Pain.  Bleeding.  Swelling.  Bruising.  How is this diagnosed? This condition may be diagnosed based on a physical exam and medical history. Your health care provider will examine the wound and ask for details about the animal and how the bite happened. You may also have tests, such as:  Blood tests to check for infection or to determine if surgery is needed.  X-rays to check for damage to bones or joints.  Culture test. This uses a sample of fluid from the wound to check for infection.  How is this treated? Treatment varies depending on the location and type of animal bite and your medical history. Treatment may include:  Wound care. This often includes cleaning the wound, flushing the wound with saline solution, and applying a bandage (dressing). Sometimes, the wound is left open to heal because of the high risk of infection. However, in some cases, the wound may be closed with stitches (sutures), staples, skin glue, or adhesive strips.  Antibiotic medicine.  Tetanus shot.  Rabies treatment if the animal could have rabies.  In some cases, bites that have become infected may require IV antibiotics and surgical treatment in the  hospital. Follow these instructions at home: Wound care  Follow instructions from your health care provider about how to take care of your wound. Make sure you: ? Wash your hands with soap and water before you change your dressing. If soap and water are not available, use hand sanitizer. ? Change your dressing as told by your health care provider. ? Leave sutures, skin glue, or adhesive strips in place. These skin closures may need to be in place for 2 weeks or longer. If adhesive strip edges start to loosen and curl up, you may trim the loose edges. Do not remove adhesive strips completely unless your health care provider tells you to do that.  Check your wound every day for signs of infection. Watch for: ? Increasing redness, swelling, or pain. ? Fluid, blood, or pus. General instructions  Take or apply over-the-counter and prescription medicines only as told by your health care provider.  If you were prescribed an antibiotic, take or apply it as told by your health care provider. Do not stop using the antibiotic even if your condition improves.  Keep the injured area raised (elevated) above the level of your heart while you are sitting or lying down, if this is possible.  If directed, apply ice to the injured area. ? Put ice in a plastic bag. ? Place a towel between your skin and the bag. ? Leave the ice on for 20 minutes, 2-3 times per day.  Keep all follow-up visits as told by your health care  provider. This is important. Contact a health care provider if:  You have increasing redness, swelling, or pain at the site of your wound.  You have a general feeling of sickness (malaise).  You feel nauseous or you vomit.  You have pain that does not get better. Get help right away if:  You have a red streak extending away from your wound.  You have fluid, blood, or pus coming from your wound.  You have a fever or chills.  You have trouble moving your injured area.  You have  numbness or tingling extending beyond the wound. This information is not intended to replace advice given to you by your health care provider. Make sure you discuss any questions you have with your health care provider. Document Released: 07/17/2011 Document Revised: 03/07/2016 Document Reviewed: 03/16/2015 Elsevier Interactive Patient Education  [employer].

## 2018-10-04 NOTE — Progress Notes (Signed)
   Subjective:    Patient ID: Donna Willis, female    DOB: December 01, 1960, 57 y.o.   MRN: [id number]  Chief Complaint  Patient presents with  . cat bite on left forearm   Pt presents to the office today with a cat bite on left forearm that occurred yesterday. She her cat is all UTD on it's immunizations.  Animal Bite   The incident occurred yesterday. Head/neck injury location: left forearm. The pain is mild. It is unlikely that a foreign body is present. Her tetanus status is UTD (2016).      Review of Systems  Skin: Positive for wound.  All other systems reviewed and are negative.      Objective:   Physical Exam  Constitutional: She is oriented to person, place, and time. She appears well-developed and well-nourished. No distress.  HENT:  Head: Normocephalic.  Neck: No thyromegaly present.  Cardiovascular: Normal rate, regular rhythm, normal heart sounds and intact distal pulses.  No murmur heard. Pulmonary/Chest: Effort normal and breath sounds normal. No respiratory distress. She has no wheezes.  Abdominal: Soft. Bowel sounds are normal. She exhibits no distension. There is no tenderness.  Musculoskeletal: Normal range of motion. She exhibits tenderness (tenderness, warmth, and hard nodule present under punture wound on left forearm). She exhibits no edema.  Neurological: She is alert and oriented to person, place, and time. She has normal reflexes. No cranial nerve deficit.  Skin: Skin is warm and dry.     Left forearm puncture wound approx 0.5 cm with erythemas extending 4X3Cm  Psychiatric: She has a normal mood and affect. Her behavior is normal. Judgment and thought content normal.  Vitals reviewed.    BP 126/80   Pulse 88   Temp 98.3 F (36.8 C)   Wt 257 lb 12.8 oz (116.9 kg)   SpO2 97%   BMI 42.90 kg/m      Assessment & Plan:  Donna Willis comes in today with chief complaint of cat bite on left forearm   Diagnosis and orders addressed:  1. Cat bite,  initial encounter Keep clean and dry Elevate when possible  TDAP UTD Report any increase in erythemas, fevers, discharge, swelling, or tenderness - amoxicillin-clavulanate (AUGMENTIN) 875-125 MG tablet; Take 1 tablet by mouth 2 (two) times daily.  Dispense: 14 tablet; Refill: 0   Donna Dun, FNP

## 2018-10-08 ENCOUNTER — Ambulatory Visit: Payer: 59 | Admitting: Family Medicine

## 2018-10-08 ENCOUNTER — Encounter: Payer: Self-pay | Admitting: Family Medicine

## 2018-10-08 VITALS — BP 130/85 | HR 71 | Resp 16 | Ht 65.0 in | Wt 259.0 lb

## 2018-10-08 DIAGNOSIS — F3341 Major depressive disorder, recurrent, in partial remission: Secondary | ICD-10-CM

## 2018-10-08 MED ORDER — FLUOXETINE HCL 20 MG PO TABS: 20.0000 mg | ORAL_TABLET | Freq: Every day | ORAL | 3 refills | Status: DC

## 2018-10-08 MED ORDER — BUPROPION HCL ER (XL) 150 MG PO TB24: 150.0000 mg | ORAL_TABLET | Freq: Every day | ORAL | 1 refills | Status: DC

## 2018-10-08 MED FILL — buPROPion HCL ER (XL) 150 M: 150 | 30 days supply | Qty: 30 | Fill #0

## 2018-10-08 MED FILL — FLUoxetine HCL 20 MG TABS: 20 | 30 days supply | Qty: 30 | Fill #0

## 2018-10-08 NOTE — Progress Notes (Signed)
OFFICE VISIT  10/08/2018   CC:  Chief Complaint  Patient presents with  . Follow-up    depression     HPI:    Patient is a 57 y.o. Caucasian female who presents for 6 week f/u MDD with associated anxiety. Increased fluoxetine to 40mg  qd last visit b/c her improvement had plateaued, possibly lost some ground.  Interim Hx: Went back to 20mg  qd b/c of mental fogginess, mood improved.  Not nearly as emotionally brittle, sleeping better. Even on 20mg  qd dosing she has a bit of tendency to forget things a bit easier.  Anxiety level is much better.  Coping better with things.   Not exercising yet. No SI or HI.   Past Medical History:  Diagnosis Date  . Atypical chest pain 08/2012   Normal Myoview 09/2012  . Borderline hyperlipidemia 05/2018  . GERD (gastroesophageal reflux disease)   . Hay fever    seasonal  . Hernia   . 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 Interstitial cystitis    Dr. Rosana Hoes: hasn't seen him in yrs or so, was on elmiron and coldn't tell much difference.  . Mild persistent asthma 08/31/2013   Stable as of pulm f/u 02/2018: no med changes made---1 yr f/u recommended.  . Personal history of  adenomatous colonic polyp 10/28/2012   10/2012 - 4x10 mm tubular adenoma; repeat 09/03/16 showed NO POLYPS.  Recall 5 yrs (08/2021).    Past Surgical History:  Procedure Laterality Date  . CARDIOVASCULAR STRESS TEST  09/2012   Rest/Stress myoview: normal  . CESAREAN SECTION  1988  . CHOLECYSTECTOMY  2002  . COLONOSCOPY W/ POLYPECTOMY  10/22/12; 09/03/16   2013; tubular adenoma, no high grade dysplasia (Dr. Carlean Purl).  Repeat 08/2016: no polyps.  Recall 5 yrs (08/2021--Dr. Carlean Purl).  . CYSTOSCOPY     with bladder bx 07/22/18-->  . HERNIA REPAIR  1610   umbilical, during lap chole  . TONSILLECTOMY AND ADENOIDECTOMY  1974  . UMBILICAL HERNIA REPAIR  08/19/2012   Procedure: LAPAROSCOPIC UMBILICAL HERNIA;  Surgeon: Shann Medal, MD;  Location: [facility];  Service:  General;  Laterality: N/A;    Outpatient Medications Prior to Visit  Medication Sig Dispense Refill  . amoxicillin-clavulanate (AUGMENTIN) 875-125 MG tablet Take 1 tablet by mouth 2 (two) times daily. 14 tablet 0  . beclomethasone (QVAR REDIHALER) 80 MCG/ACT inhaler Inhale 2 puffs into the lungs daily. 1 Inhaler 5  . famotidine (PEPCID) 20 MG tablet Take 20 mg by mouth as needed for heartburn or indigestion.    Marland Kitchen ibuprofen (ADVIL,MOTRIN) 200 MG tablet Take 1 tablet by mouth every 6 (six) hours as needed.    . triamcinolone (NASACORT) 55 MCG/ACT AERO nasal inhaler Place 1 spray into the nose as needed.    . VENTOLIN HFA 108 (90 Base) MCG/ACT inhaler INHALE 2 PUFFS INTO THE LUNGS EVERY 6 HOURS AS NEEDED FOR WHEEZING. 18 g 5  . FLUoxetine (PROZAC) 40 MG capsule Take 1 capsule (40 mg total) by mouth daily. (Patient taking differently: Take 20 mg by mouth daily. ) 30 capsule 1   No facility-administered medications prior to visit.     Allergies  Allergen Reactions  . Ciprofloxacin Itching    ROS As per HPI  PE: Blood pressure 130/85, pulse 71, resp. rate 16, height 5\' 5"  (1.651 m), weight 259 lb (117.5 kg), SpO2 97 %. Gen: Alert, well appearing.  Patient is oriented to person, place, time, and situation. AFFECT: pleasant, lucid thought and speech.  LABS:    Chemistry      Component Value Date/Time   NA 140 06/03/2018 1016   K 4.2 06/03/2018 1016   CL 104 06/03/2018 1016   CO2 28 06/03/2018 1016   BUN 10 06/03/2018 1016   CREATININE 0.73 06/03/2018 1016      Component Value Date/Time   CALCIUM 9.3 06/03/2018 1016   ALKPHOS 53 06/03/2018 1016   AST 14 06/03/2018 1016   ALT 19 06/03/2018 1016   BILITOT 0.5 06/03/2018 1016      IMPRESSION AND PLAN:  MDD with associated anxiety. Improved significantly but experiencing a bit of memory difficulty/cognitive fogginess from fluoxetine. This is tolerable for her on the 20mg  qd dosing, so we'll continue this. For attempt to get  better efficacy and possibly help her memory/concentration, we'll add wellbutrin xl 150mg  qd today. Therapeutic expectations and side effect profile of medication discussed today.  Patient's questions answered.  An After Visit Summary was printed and given to the patient.  FOLLOW UP: Return in about 4 weeks (around 11/05/2018) for f/u mood/anx.  Signed:  Crissie Sickles, MD           10/08/2018

## 2018-10-23 MED FILL — QVAR REDIHALER 80 MCG/ACT A: 80 | 60 days supply | Qty: 11 | Fill #2

## 2018-11-03 ENCOUNTER — Encounter: Payer: Self-pay | Admitting: *Deleted

## 2018-11-03 DIAGNOSIS — F329 Major depressive disorder, single episode, unspecified: Secondary | ICD-10-CM | POA: Insufficient documentation

## 2018-11-07 ENCOUNTER — Encounter: Payer: Self-pay | Admitting: Family Medicine

## 2018-11-07 ENCOUNTER — Ambulatory Visit: Payer: 59 | Admitting: Family Medicine

## 2018-11-07 VITALS — BP 117/76 | HR 71 | Temp 98.2°F | Resp 16 | Ht 65.0 in | Wt 256.0 lb

## 2018-11-07 DIAGNOSIS — F325 Major depressive disorder, single episode, in full remission: Secondary | ICD-10-CM | POA: Diagnosis not present

## 2018-11-07 NOTE — Progress Notes (Signed)
OFFICE VISIT  11/07/2018   CC:  Chief Complaint  Patient presents with  . Follow-up    mood/anxiety   HPI:    Patient is a 57 y.o. Caucasian female who presents for 1 mo f/u MDD with associated anxiety. Last visit she had improved significantly on 40mg  fluoxetine but was experiencing a bit of memory difficulty/cognitive fogginess.   We decreased her dose back to 20mg  qd fluox and added wellbutrin xl 150mg  qd to try to improve mood as well as concentration/memory.  Interim hx: Doing better. Stayed stable mood and anxiety despite anniversary of her father's death this month. Anxiety level is not bad.  Concentration and memory improved!   No signif adverse side effect.  She feels like she gets some hot flashes from her prozac.  She is willing to tolerate this. She is happy with current regimen.  Past Medical History:  Diagnosis Date  . Atypical chest pain 08/2012   Normal Myoview 09/2012  . Borderline hyperlipidemia 05/2018  . GERD (gastroesophageal reflux disease)   . Hay fever    seasonal  . Hernia   . 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 Interstitial cystitis    Dr. Rosana Hoes: hasn't seen him in yrs or so, was on elmiron and coldn't tell much difference.  . MDD (major depressive disorder)   . Mild persistent asthma 08/31/2013   Stable as of pulm f/u 02/2018: no med changes made---1 yr f/u recommended.  . Personal history of  adenomatous colonic polyp 10/28/2012   10/2012 - 4x10 mm tubular adenoma; repeat 09/03/16 showed NO POLYPS.  Recall 5 yrs (08/2021).    Past Surgical History:  Procedure Laterality Date  . CARDIOVASCULAR STRESS TEST  09/2012   Rest/Stress myoview: normal  . CESAREAN SECTION  1988  . CHOLECYSTECTOMY  2002  . COLONOSCOPY W/ POLYPECTOMY  10/22/12; 09/03/16   2013; tubular adenoma, no high grade dysplasia (Dr. Carlean Purl).  Repeat 08/2016: no polyps.  Recall 5 yrs (08/2021--Dr. Carlean Purl).  . CYSTOSCOPY     with bladder bx 07/22/18-->  . HERNIA REPAIR   1540   umbilical, during lap chole  . TONSILLECTOMY AND ADENOIDECTOMY  1974  . UMBILICAL HERNIA REPAIR  08/19/2012   Procedure: LAPAROSCOPIC UMBILICAL HERNIA;  Surgeon: Shann Medal, MD;  Location: [facility];  Service: General;  Laterality: N/A;    Outpatient Medications Prior to Visit  Medication Sig Dispense Refill  . beclomethasone (QVAR REDIHALER) 80 MCG/ACT inhaler Inhale 2 puffs into the lungs daily. 1 Inhaler 5  . buPROPion (WELLBUTRIN XL) 150 MG 24 hr tablet Take 1 tablet (150 mg total) by mouth daily. 30 tablet 1  . famotidine (PEPCID) 20 MG tablet Take 20 mg by mouth as needed for heartburn or indigestion.    Marland Kitchen FLUoxetine (PROZAC) 20 MG tablet Take 1 tablet (20 mg total) by mouth daily. 30 tablet 3  . ibuprofen (ADVIL,MOTRIN) 200 MG tablet Take 1 tablet by mouth every 6 (six) hours as needed.    . triamcinolone (NASACORT) 55 MCG/ACT AERO nasal inhaler Place 1 spray into the nose as needed.    . VENTOLIN HFA 108 (90 Base) MCG/ACT inhaler INHALE 2 PUFFS INTO THE LUNGS EVERY 6 HOURS AS NEEDED FOR WHEEZING. 18 g 5  . amoxicillin-clavulanate (AUGMENTIN) 875-125 MG tablet Take 1 tablet by mouth 2 (two) times daily. (Patient not taking: Reported on 11/07/2018) 14 tablet 0   No facility-administered medications prior to visit.     Allergies  Allergen Reactions  . Ciprofloxacin Itching  ROS As per HPI  PE: Blood pressure 117/76, pulse 71, temperature 98.2 F (36.8 C), temperature source Oral, resp. rate 16, height 5\' 5"  (1.651 m), weight 256 lb (116.1 kg), last menstrual period 10/28/2018, SpO2 98 %. Gen: Alert, well appearing.  Patient is oriented to person, place, time, and situation. AFFECT: pleasant, lucid thought and speech. No further exam today.  LABS:  none  IMPRESSION AND PLAN:  MDD, in remission. The current medical regimen is effective;  continue present plan and medications.  An After Visit Summary was printed and given to the patient.  FOLLOW UP: Return for  f/u 5-6 months (June 2020) for fasting CPE.  Signed:  Crissie Sickles, MD           11/07/2018

## 2018-11-10 MED FILL — buPROPion HCL ER (XL) 150 M: 150 | 30 days supply | Qty: 30 | Fill #1

## 2018-12-16 ENCOUNTER — Other Ambulatory Visit: Payer: Self-pay | Admitting: Family Medicine

## 2018-12-16 MED FILL — buPROPion HCL ER (XL) 150 M: 150 | 30 days supply | Qty: 30 | Fill #0

## 2018-12-29 MED FILL — FLUoxetine HCL 20 MG TABS: 20 | 30 days supply | Qty: 30 | Fill #1

## 2019-01-02 ENCOUNTER — Other Ambulatory Visit: Payer: Self-pay

## 2019-01-02 ENCOUNTER — Encounter (HOSPITAL_COMMUNITY): Payer: Self-pay | Admitting: Emergency Medicine

## 2019-01-02 ENCOUNTER — Emergency Department (HOSPITAL_COMMUNITY)
Admission: EM | Admit: 2019-01-02 | Discharge: 2019-01-03 | Disposition: A | Discharge status: Home / Self Care | Payer: 59 | Attending: Emergency Medicine | Admitting: Emergency Medicine

## 2019-01-02 DIAGNOSIS — Z79899 Other long term (current) drug therapy: Secondary | ICD-10-CM | POA: Diagnosis not present

## 2019-01-02 DIAGNOSIS — Y999 Unspecified external cause status: Secondary | ICD-10-CM | POA: Diagnosis not present

## 2019-01-02 DIAGNOSIS — W5501XA Bitten by cat, initial encounter: Secondary | ICD-10-CM | POA: Diagnosis not present

## 2019-01-02 DIAGNOSIS — S81852A Open bite, left lower leg, initial encounter: Secondary | ICD-10-CM | POA: Insufficient documentation

## 2019-01-02 DIAGNOSIS — Y929 Unspecified place or not applicable: Secondary | ICD-10-CM | POA: Insufficient documentation

## 2019-01-02 DIAGNOSIS — Y9389 Activity, other specified: Secondary | ICD-10-CM | POA: Insufficient documentation

## 2019-01-02 NOTE — ED Notes (Signed)
Two lacerations noted to pt's left shin. Bleeding controlled PTA by pt. Laceration began to bleed once bandages were removed. Wounds were bandaged again with bleeding controlled.

## 2019-01-02 NOTE — ED Provider Notes (Signed)
[facility] EMERGENCY DEPARTMENT Provider Note   CSN: [id number] Arrival date & time: 01/02/19  2155    History   Chief Complaint Chief Complaint  Patient presents with  . Animal Bite    HPI Donna Willis is a 58 y.o. female.     58 y.o female with a PMH of GERD. MDD presents to the ED with a chief complaint of cat bite x 3 hours ago. Patient reports she was playing with her cat who bite her on the left lower shin. She reports the cat is up to date with all vaccines. She also reports controlling the bleeding with a gauze and washing the wound with wound soap, she also reports applying triple antibiotic ointment to the wound. She reports this has happened in the past. She denies any other complaints at this time.      Past Medical History:  Diagnosis Date  . Atypical chest pain 08/2012   Normal Myoview 09/2012  . Borderline hyperlipidemia 05/2018  . GERD (gastroesophageal reflux disease)   . Hay fever    seasonal  . Hernia   . 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 Interstitial cystitis    Dr. Rosana Hoes: hasn't seen him in yrs or so, was on elmiron and coldn't tell much difference.  . MDD (major depressive disorder)   . Mild persistent asthma 08/31/2013   Stable as of pulm f/u 02/2018: no med changes made---1 yr f/u recommended.  . Personal history of  adenomatous colonic polyp 10/28/2012   10/2012 - 4x10 mm tubular adenoma; repeat 09/03/16 showed NO POLYPS.  Recall 5 yrs (08/2021).    Patient Active Problem List   Diagnosis Date Noted  . MDD (major depressive disorder)   . Chronic interstitial cystitis 06/06/2018  . Cough 10/25/2015  . Lateral epicondylitis of right elbow 01/21/2015  . Right elbow pain 12/28/2014  . Asthma 08/11/2014  . Thrush 08/11/2014  . Chronic cough 08/31/2013  . Mild intermittent asthma in adult without complication 49/17/9150  . Allergic rhinitis 08/31/2013  . 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 History of colonic  polyps 10/28/2012  . Health maintenance examination 09/25/2012  . Atypical chest pain 09/04/2012  . Incarcerated umbilical hernia, repaired 08/19/2012. 08/18/2012    Past Surgical History:  Procedure Laterality Date  . CARDIOVASCULAR STRESS TEST  09/2012   Rest/Stress myoview: normal  . CESAREAN SECTION  1988  . CHOLECYSTECTOMY  2002  . COLONOSCOPY W/ POLYPECTOMY  10/22/12; 09/03/16   2013; tubular adenoma, no high grade dysplasia (Dr. Carlean Purl).  Repeat 08/2016: no polyps.  Recall 5 yrs (08/2021--Dr. Carlean Purl).  . CYSTOSCOPY     with bladder bx 07/22/18-->  . HERNIA REPAIR  5697   umbilical, during lap chole  . TONSILLECTOMY AND ADENOIDECTOMY  1974  . UMBILICAL HERNIA REPAIR  08/19/2012   Procedure: LAPAROSCOPIC UMBILICAL HERNIA;  Surgeon: Shann Medal, MD;  Location: [facility];  Service: General;  Laterality: N/A;     OB History   No obstetric history on file.      Home Medications    Prior to Admission medications   Medication Sig Start Date End Date Taking? Authorizing Provider  amoxicillin-clavulanate (AUGMENTIN) 875-125 MG tablet Take 1 tablet by mouth 2 (two) times daily for 7 days. 01/03/19 01/10/19  Janeece Fitting, PA-C  beclomethasone (QVAR REDIHALER) 80 MCG/ACT inhaler Inhale 2 puffs into the lungs daily. 02/25/18   Parrett, Fonnie Mu, NP  buPROPion (WELLBUTRIN XL) 150 MG 24 hr tablet TAKE 1 TABLET  BY MOUTH DAILY. 12/16/18   McGowen, Adrian Blackwater, MD  famotidine (PEPCID) 20 MG tablet Take 20 mg by mouth as needed for heartburn or indigestion.    [provider]  FLUoxetine (PROZAC) 20 MG tablet Take 1 tablet (20 mg total) by mouth daily. 10/08/18   McGowen, Adrian Blackwater, MD  ibuprofen (ADVIL,MOTRIN) 200 MG tablet Take 1 tablet by mouth every 6 (six) hours as needed.    [provider]  triamcinolone (NASACORT) 55 MCG/ACT AERO nasal inhaler Place 1 spray into the nose as needed.    [provider]  VENTOLIN HFA 108 (90 Base) MCG/ACT inhaler INHALE 2 PUFFS INTO  THE LUNGS EVERY 6 HOURS AS NEEDED FOR WHEEZING. 02/25/18   Parrett, Fonnie Mu, NP    Family History Family History  Problem Relation Age of Onset  . Colon polyps Mother   . Diverticulitis Father   . Other Maternal Uncle        great-stom or colon cancer  . Colon cancer Maternal Uncle   . Rectal cancer Neg Hx     Social History Social History   Tobacco Use  . Smoking status: Never Smoker  . Smokeless tobacco: Never Used  Substance Use Topics  . Alcohol use: No  . Drug use: No     Allergies   Ciprofloxacin   Review of Systems Review of Systems  Constitutional: Negative for fever.  Skin: Positive for wound. Negative for rash.     Physical Exam Updated Vital Signs BP (!) 155/93 (BP Location: Left Arm)   Pulse 69   Temp 98.1 F (36.7 C) (Oral)   Resp 16   Wt 115.7 kg   SpO2 95%   BMI 42.43 kg/m   Physical Exam Vitals signs and nursing note reviewed.  Constitutional:      General: She is not in acute distress.    Appearance: She is well-developed.  HENT:     Head: Normocephalic and atraumatic.     Mouth/Throat:     Pharynx: No oropharyngeal exudate.  Eyes:     Pupils: Pupils are equal, round, and reactive to light.  Neck:     Musculoskeletal: Normal range of motion.  Cardiovascular:     Rate and Rhythm: Regular rhythm.     Heart sounds: Normal heart sounds.  Pulmonary:     Effort: Pulmonary effort is normal. No respiratory distress.     Breath sounds: Normal breath sounds.  Abdominal:     General: Bowel sounds are normal. There is no distension.     Palpations: Abdomen is soft.     Tenderness: There is no abdominal tenderness.  Musculoskeletal:        General: No tenderness or deformity.     Right lower leg: No edema.     Left lower leg: No edema.       Legs:  Skin:    General: Skin is warm and dry.  Neurological:     Mental Status: She is alert and oriented to person, place, and time.      ED Treatments / Results  Labs (all labs ordered  are listed, but only abnormal results are displayed) Labs Reviewed - No data to display  EKG None  Radiology No results found.  Procedures Procedures (including critical care time)  Medications Ordered in ED Medications - No data to display   Initial Impression / Assessment and Plan / ED Course  I have reviewed the triage vital signs and the nursing notes.  Pertinent  labs & imaging results that were available during my care of the patient were reviewed by me and considered in my medical decision making (see chart for details).       Patient presents to the ED with a cat bite to her left shin, reports her cat is up-to-date on all vaccines.  Patient versus incident has happened in the past.  During evaluation there were 2 lacerations to her left shin parallel to each other, bleeding is controlled with a nonadherent gauze.  I have personally irrigated patient's wound with normal saline 1 L with a pressure adapter.  Patient tolerated procedure well, will provide her with a prescription for omentum to prevent any infection after her bite.  Patient has been educated on strict return precautions, she is to follow-up with PCP after completion of treatment.  Patient understands and agrees with management.  Return precautions provided.  Final Clinical Impressions(s) / ED Diagnoses   Final diagnoses:  Cat bite, initial encounter    ED Discharge Orders         Ordered    amoxicillin-clavulanate (AUGMENTIN) 875-125 MG tablet  2 times daily     01/03/19 0002           Janeece Fitting, PA-C 01/03/19 0037    Sherwood Gambler, MD 01/04/19 1505

## 2019-01-02 NOTE — ED Triage Notes (Signed)
C/o cat bite to L lower leg since 8:30pm.  States it was her cat and it has current immunizations.  Ace bandage applied pta to control bleeding.

## 2019-01-03 MED ORDER — AMOXICILLIN-POT CLAVULANATE 875-125 MG PO TABS: 1.0000 | ORAL_TABLET | Freq: Two times a day (BID) | ORAL | 0 refills | Status: DC

## 2019-01-03 NOTE — Discharge Instructions (Addendum)
I have provided antibiotics to prevent infection. Please take one tablet twice a day for the next 7 days. If you experience any fever, redness or drainage from the wound please return to the ED.

## 2019-01-06 ENCOUNTER — Encounter: Payer: Self-pay | Admitting: *Deleted

## 2019-01-06 ENCOUNTER — Emergency Department
Admission: EM | Admit: 2019-01-06 | Discharge: 2019-01-06 | Disposition: A | Discharge status: Home / Self Care | Payer: 59 | Source: Home / Self Care | Attending: Family Medicine | Admitting: Family Medicine

## 2019-01-06 ENCOUNTER — Other Ambulatory Visit: Payer: Self-pay

## 2019-01-06 DIAGNOSIS — W5501XD Bitten by cat, subsequent encounter: Secondary | ICD-10-CM | POA: Diagnosis not present

## 2019-01-06 DIAGNOSIS — L03116 Cellulitis of left lower limb: Secondary | ICD-10-CM

## 2019-01-06 MED ORDER — METRONIDAZOLE 500 MG PO TABS: ORAL_TABLET | ORAL | 0 refills | Status: DC

## 2019-01-06 MED ORDER — MUPIROCIN 2 % EX OINT: 1.0000 "application " | TOPICAL_OINTMENT | Freq: Three times a day (TID) | CUTANEOUS | 0 refills | Status: DC

## 2019-01-06 MED ORDER — DOXYCYCLINE HYCLATE 100 MG PO CAPS: 100.0000 mg | ORAL_CAPSULE | Freq: Two times a day (BID) | ORAL | 0 refills | Status: DC

## 2019-01-06 MED FILL — MUPIROCIN 2% OINTMENT: 2 | 14 days supply | Qty: 22 | Fill #0

## 2019-01-06 MED FILL — metroNIDAZOLE 500 MG TABS: 500 | 10 days supply | Qty: 30 | Fill #0

## 2019-01-06 MED FILL — DOXYCYCLINE HYCLATE 100 MG: 100 | 10 days supply | Qty: 20 | Fill #0

## 2019-01-06 NOTE — ED Triage Notes (Signed)
Patient is here for follow up of cat bite to LLE. Pts cat bit her 3 days ago, went to ER the evening it happened. Taking Augmentin.

## 2019-01-06 NOTE — Discharge Instructions (Addendum)
Discontinue Augmentin. Change bandage 3 times daily and apply mupirocin ointment.  Elevate leg when possible. Wear ace wraps daytime in graduated compression fashion.  If symptoms become significantly worse during the night or over the weekend, proceed to the local emergency room.

## 2019-01-06 NOTE — ED Provider Notes (Signed)
[name]    CSN: [id number] Arrival date & time: 01/06/19  1051     History   Chief Complaint Chief Complaint  Patient presents with  . Animal Bite  . Wound Check    HPI Donna Willis is a 58 y.o. female.   Patient was bitten by her cat on her left lower leg pretibial area four days ago.  She was evaluated in ER where she was started on Augmentin.  Patient reports that she has had increased redness/soreness around wound and persistent drainage.  She denies fevers, chills, and sweats.  She has been applying left-over mupirocin ointment.  The history is provided by the patient.  Animal Bite  Contact animal:  Cat Location:  Leg Leg injury location:  L lower leg Time since incident:  4 days Pain details:    Quality:  Aching and burning   Severity:  Mild   Timing:  Constant   Progression:  Worsening Provoked: provoked   Animal's rabies vaccination status:  Up to date Animal in possession: yes   Tetanus status:  Up to date Ineffective treatments:  Prescription drugs Associated symptoms: swelling   Associated symptoms: no fever, no numbness and no rash   Wound Check  Pertinent negatives include no shortness of breath.    Past Medical History:  Diagnosis Date  . Atypical chest pain 08/2012   Normal Myoview 09/2012  . Borderline hyperlipidemia 05/2018  . GERD (gastroesophageal reflux disease)   . Hay fever    seasonal  . Hernia   . 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 Interstitial cystitis    Dr. Rosana Hoes: hasn't seen him in yrs or so, was on elmiron and coldn't tell much difference.  . MDD (major depressive disorder)   . Mild persistent asthma 08/31/2013   Stable as of pulm f/u 02/2018: no med changes made---1 yr f/u recommended.  . Personal history of  adenomatous colonic polyp 10/28/2012   10/2012 - 4x10 mm tubular adenoma; repeat 09/03/16 showed NO POLYPS.  Recall 5 yrs (08/2021).    Patient Active Problem List   Diagnosis Date Noted  . MDD  (major depressive disorder)   . Chronic interstitial cystitis 06/06/2018  . Cough 10/25/2015  . Lateral epicondylitis of right elbow 01/21/2015  . Right elbow pain 12/28/2014  . Asthma 08/11/2014  . Thrush 08/11/2014  . Chronic cough 08/31/2013  . Mild intermittent asthma in adult without complication 40/98/1191  . Allergic rhinitis 08/31/2013  . 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 History of colonic polyps 10/28/2012  . Health maintenance examination 09/25/2012  . Atypical chest pain 09/04/2012  . Incarcerated umbilical hernia, repaired 08/19/2012. 08/18/2012    Past Surgical History:  Procedure Laterality Date  . CARDIOVASCULAR STRESS TEST  09/2012   Rest/Stress myoview: normal  . CESAREAN SECTION  1988  . CHOLECYSTECTOMY  2002  . COLONOSCOPY W/ POLYPECTOMY  10/22/12; 09/03/16   2013; tubular adenoma, no high grade dysplasia (Dr. Carlean Purl).  Repeat 08/2016: no polyps.  Recall 5 yrs (08/2021--Dr. Carlean Purl).  . CYSTOSCOPY     with bladder bx 07/22/18-->  . HERNIA REPAIR  [id number]   umbilical, during lap chole  . TONSILLECTOMY AND ADENOIDECTOMY  1974  . UMBILICAL HERNIA REPAIR  08/19/2012   Procedure: LAPAROSCOPIC UMBILICAL HERNIA;  Surgeon: Shann Medal, MD;  Location: [facility];  Service: General;  Laterality: N/A;    OB History   No obstetric history on file.      Home Medications  Prior to Admission medications   Medication Sig Start Date End Date Taking? Authorizing Provider  amoxicillin-clavulanate (AUGMENTIN) 875-125 MG tablet Take 1 tablet by mouth 2 (two) times daily for 7 days. 01/03/19 01/10/19  Janeece Fitting, PA-C  beclomethasone (QVAR REDIHALER) 80 MCG/ACT inhaler Inhale 2 puffs into the lungs daily. 02/25/18   Parrett, Fonnie Mu, NP  buPROPion (WELLBUTRIN XL) 150 MG 24 hr tablet TAKE 1 TABLET BY MOUTH DAILY. 12/16/18   McGowen, Adrian Blackwater, MD  doxycycline (VIBRAMYCIN) 100 MG capsule Take 1 capsule (100 mg total) by mouth 2 (two) times daily. Take one cap PO every 12  hours. Take with food. 01/06/19   Kandra Nicolas, MD  famotidine (PEPCID) 20 MG tablet Take 20 mg by mouth as needed for heartburn or indigestion.    [provider]  FLUoxetine (PROZAC) 20 MG tablet Take 1 tablet (20 mg total) by mouth daily. 10/08/18   McGowen, Adrian Blackwater, MD  ibuprofen (ADVIL,MOTRIN) 200 MG tablet Take 1 tablet by mouth every 6 (six) hours as needed.    [provider]  metroNIDAZOLE (FLAGYL) 500 MG tablet Take one cap PO every 8 hours 01/06/19   Kandra Nicolas, MD  mupirocin ointment (BACTROBAN) 2 % Apply 1 application topically 3 (three) times daily. 01/06/19   Kandra Nicolas, MD  triamcinolone (NASACORT) 55 MCG/ACT AERO nasal inhaler Place 1 spray into the nose as needed.    [provider]  VENTOLIN HFA 108 (90 Base) MCG/ACT inhaler INHALE 2 PUFFS INTO THE LUNGS EVERY 6 HOURS AS NEEDED FOR WHEEZING. 02/25/18   Parrett, Fonnie Mu, NP    Family History Family History  Problem Relation Age of Onset  . Colon polyps Mother   . Diverticulitis Father   . Other Maternal Uncle        great-stom or colon cancer  . Colon cancer Maternal Uncle   . Rectal cancer Neg Hx     Social History Social History   Tobacco Use  . Smoking status: Never Smoker  . Smokeless tobacco: Never Used  Substance Use Topics  . Alcohol use: No  . Drug use: No     Allergies   Ciprofloxacin   Review of Systems Review of Systems  Constitutional: Negative for activity change, appetite change, chills, diaphoresis, fatigue and fever.  Respiratory: Negative for cough, chest tightness, shortness of breath, wheezing and stridor.   Cardiovascular: Positive for leg swelling. Negative for palpitations.  Skin: Negative for rash.  Neurological: Negative for numbness.  All other systems reviewed and are negative.    Physical Exam Triage Vital Signs ED Triage Vitals  Enc Vitals Group     BP 01/06/19 1109 (!) 157/85     Pulse Rate 01/06/19 1109 79     Resp 01/06/19  1109 16     Temp 01/06/19 1109 98.2 F (36.8 C)     Temp Source 01/06/19 1109 Oral     SpO2 01/06/19 1109 95 %     Weight 01/06/19 1110 262 lb (118.8 kg)     Height --      Head Circumference --      Peak Flow --      Pain Score 01/06/19 1110 6     Pain Loc --      Pain Edu? --      Excl. in [place]? --    No data found.  Updated Vital Signs BP (!) 157/85 (BP Location: Right Arm)   Pulse 79   Temp  98.2 F (36.8 C) (Oral)   Resp 16   Wt 118.8 kg   SpO2 95%   BMI 43.60 kg/m   Visual Acuity Right Eye Distance:   Left Eye Distance:   Bilateral Distance:    Right Eye Near:   Left Eye Near:    Bilateral Near:     Physical Exam Vitals signs and nursing note reviewed.  Constitutional:      General: She is not in acute distress.    Appearance: She is obese.  HENT:     Head: Normocephalic.     Right Ear: External ear normal.     Left Ear: External ear normal.     Nose: Nose normal.  Eyes:     Pupils: Pupils are equal, round, and reactive to light.  Cardiovascular:     Rate and Rhythm: Normal rate.  Pulmonary:     Effort: Pulmonary effort is normal.  Musculoskeletal:     Left lower leg: She exhibits tenderness, swelling and laceration. She exhibits no bony tenderness. Edema present.       Legs:     Comments: Left lower leg has two horizontal superficial lacerations as noted on diagram.  The area around wounds are erythematous and tender to palpation, and there is serosanguinous drainage from the wounds.  There is no calf tenderness to palpation.  Distal neurovascular function is intact.  Left lower leg has trace edema.  Skin:    General: Skin is warm and dry.  Neurological:     Mental Status: She is alert.      UC Treatments / Results  Labs (all labs ordered are listed, but only abnormal results are displayed) Labs Reviewed  WOUND CULTURE    EKG None  Radiology No results found.  Procedures Procedures (including critical care time)  Medications Ordered  in UC Medications - No data to display  Initial Impression / Assessment and Plan / UC Course  I have reviewed the triage vital signs and the nursing notes.  Pertinent labs & imaging results that were available during my care of the patient were reviewed by me and considered in my medical decision making (see chart for details).    Cellulitis not responding to Augmentin.  Wound culture obtained.  Wound cleaned with sterile saline and bandage applied with Bacitracin ointment.  Ace wrap applied in graduated compression fashion. Rx for Bactroban ointment. Switch to doxycycline and metronidazole. Return in 2 to 3 days for follow-up.  If increasing calf tenderness develops will need venous ultrasound.   Final Clinical Impressions(s) / UC Diagnoses   Final diagnoses:  Cat bite, subsequent encounter  Cellulitis of left anterior lower leg     Discharge Instructions     Discontinue Augmentin. Change bandage 3 times daily and apply mupirocin ointment.  Elevate leg when possible. Wear ace wraps daytime in graduated compression fashion.  If symptoms become significantly worse during the night or over the weekend, proceed to the local emergency room.     ED Prescriptions    Medication Sig Dispense Auth. Provider   doxycycline (VIBRAMYCIN) 100 MG capsule Take 1 capsule (100 mg total) by mouth 2 (two) times daily. Take one cap PO every 12 hours. Take with food. 20 capsule Kandra Nicolas, MD   metroNIDAZOLE (FLAGYL) 500 MG tablet Take one cap PO every 8 hours 30 tablet Kandra Nicolas, MD   mupirocin ointment (BACTROBAN) 2 % Apply 1 application topically 3 (three) times daily. 30 g Kandra Nicolas, MD  Kandra Nicolas, MD 01/06/19 1153

## 2019-01-09 ENCOUNTER — Encounter: Payer: Self-pay | Admitting: Emergency Medicine

## 2019-01-09 ENCOUNTER — Telehealth: Payer: Self-pay

## 2019-01-09 ENCOUNTER — Emergency Department (INDEPENDENT_AMBULATORY_CARE_PROVIDER_SITE_OTHER)
Admission: EM | Admit: 2019-01-09 | Discharge: 2019-01-09 | Disposition: A | Discharge status: Home / Self Care | Payer: 59 | Source: Home / Self Care | Attending: Family Medicine | Admitting: Family Medicine

## 2019-01-09 DIAGNOSIS — L03116 Cellulitis of left lower limb: Secondary | ICD-10-CM

## 2019-01-09 LAB — WOUND CULTURE
MICRO NUMBER:: 239380
RESULT:: NO GROWTH
SPECIMEN QUALITY:: ADEQUATE

## 2019-01-09 NOTE — ED Triage Notes (Signed)
Pt here for f/u from cat bite on 2/25. States she is taking doxy and feels like her leg is improving.

## 2019-01-09 NOTE — ED Provider Notes (Signed)
[facility]    CSN: [id number] Arrival date & time: 01/09/19  1709     History   Chief Complaint Chief Complaint  Patient presents with  . Follow-up    HPI Donna Willis is a 58 y.o. female.   Patient returns for follow-up of cat-bite laceration left lower anterior leg.  She reports that there is still small amount of drainage from the wound but she feels that it is improving.  The history is provided by the patient.    Past Medical History:  Diagnosis Date  . Atypical chest pain 08/2012   Normal Myoview 09/2012  . Borderline hyperlipidemia 05/2018  . GERD (gastroesophageal reflux disease)   . Hay fever    seasonal  . Hernia   . 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 Interstitial cystitis    Dr. Rosana Hoes: hasn't seen him in yrs or so, was on elmiron and coldn't tell much difference.  . MDD (major depressive disorder)   . Mild persistent asthma 08/31/2013   Stable as of pulm f/u 02/2018: no med changes made---1 yr f/u recommended.  . Personal history of  adenomatous colonic polyp 10/28/2012   10/2012 - 4x10 mm tubular adenoma; repeat 09/03/16 showed NO POLYPS.  Recall 5 yrs (08/2021).    Patient Active Problem List   Diagnosis Date Noted  . MDD (major depressive disorder)   . Chronic interstitial cystitis 06/06/2018  . Cough 10/25/2015  . Lateral epicondylitis of right elbow 01/21/2015  . Right elbow pain 12/28/2014  . Asthma 08/11/2014  . Thrush 08/11/2014  . Chronic cough 08/31/2013  . Mild intermittent asthma in adult without complication 87/56/4332  . Allergic rhinitis 08/31/2013  . 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 History of colonic polyps 10/28/2012  . Health maintenance examination 09/25/2012  . Atypical chest pain 09/04/2012  . Incarcerated umbilical hernia, repaired 08/19/2012. 08/18/2012    Past Surgical History:  Procedure Laterality Date  . CARDIOVASCULAR STRESS TEST  09/2012   Rest/Stress myoview: normal  . CESAREAN SECTION   1988  . CHOLECYSTECTOMY  2002  . COLONOSCOPY W/ POLYPECTOMY  10/22/12; 09/03/16   2013; tubular adenoma, no high grade dysplasia (Dr. Carlean Purl).  Repeat 08/2016: no polyps.  Recall 5 yrs (08/2021--Dr. Carlean Purl).  . CYSTOSCOPY     with bladder bx 07/22/18-->  . HERNIA REPAIR  9518   umbilical, during lap chole  . TONSILLECTOMY AND ADENOIDECTOMY  1974  . UMBILICAL HERNIA REPAIR  08/19/2012   Procedure: LAPAROSCOPIC UMBILICAL HERNIA;  Surgeon: Shann Medal, MD;  Location: [facility];  Service: General;  Laterality: N/A;    OB History   No obstetric history on file.      Home Medications    Prior to Admission medications   Medication Sig Start Date End Date Taking? Authorizing Provider  beclomethasone (QVAR REDIHALER) 80 MCG/ACT inhaler Inhale 2 puffs into the lungs daily. 02/25/18   Parrett, Fonnie Mu, NP  buPROPion (WELLBUTRIN XL) 150 MG 24 hr tablet TAKE 1 TABLET BY MOUTH DAILY. 12/16/18   McGowen, Adrian Blackwater, MD  doxycycline (VIBRAMYCIN) 100 MG capsule Take 1 capsule (100 mg total) by mouth 2 (two) times daily. Take one cap PO every 12 hours. Take with food. 01/06/19   Kandra Nicolas, MD  famotidine (PEPCID) 20 MG tablet Take 20 mg by mouth as needed for heartburn or indigestion.    [provider]  FLUoxetine (PROZAC) 20 MG tablet Take 1 tablet (20 mg total) by mouth daily. 10/08/18  McGowen, Adrian Blackwater, MD  ibuprofen (ADVIL,MOTRIN) 200 MG tablet Take 1 tablet by mouth every 6 (six) hours as needed.    [provider]  metroNIDAZOLE (FLAGYL) 500 MG tablet Take one cap PO every 8 hours 01/06/19   Kandra Nicolas, MD  mupirocin ointment (BACTROBAN) 2 % Apply 1 application topically 3 (three) times daily. 01/06/19   Kandra Nicolas, MD  triamcinolone (NASACORT) 55 MCG/ACT AERO nasal inhaler Place 1 spray into the nose as needed.    [provider]  VENTOLIN HFA 108 (90 Base) MCG/ACT inhaler INHALE 2 PUFFS INTO THE LUNGS EVERY 6 HOURS AS NEEDED FOR WHEEZING. 02/25/18    Parrett, Fonnie Mu, NP    Family History Family History  Problem Relation Age of Onset  . Colon polyps Mother   . Diverticulitis Father   . Other Maternal Uncle        great-stom or colon cancer  . Colon cancer Maternal Uncle   . Rectal cancer Neg Hx     Social History Social History   Tobacco Use  . Smoking status: Never Smoker  . Smokeless tobacco: Never Used  Substance Use Topics  . Alcohol use: No  . Drug use: No     Allergies   Ciprofloxacin   Review of Systems Review of Systems  Constitutional: Negative for chills, diaphoresis, fatigue and fever.  HENT: Negative.   Eyes: Negative.   Respiratory: Negative for cough, chest tightness, shortness of breath and wheezing.   Cardiovascular: Positive for leg swelling. Negative for chest pain.  Gastrointestinal: Negative.  Negative for diarrhea.  Genitourinary: Negative.   Musculoskeletal: Negative.   Skin: Positive for color change.  Neurological: Negative.      Physical Exam Triage Vital Signs ED Triage Vitals [01/09/19 1735]  Enc Vitals Group     BP 125/78     Pulse Rate 84     Resp      Temp 97.8 F (36.6 C)     Temp Source Oral     SpO2 96 %     Weight      Height      Head Circumference      Peak Flow      Pain Score 0     Pain Loc      Pain Edu?      Excl. in [place]?    No data found.  Updated Vital Signs BP 125/78 (BP Location: Right Arm)   Pulse 84   Temp 97.8 F (36.6 C) (Oral)   SpO2 96%   Visual Acuity Right Eye Distance:   Left Eye Distance:   Bilateral Distance:    Right Eye Near:   Left Eye Near:    Bilateral Near:     Physical Exam Vitals signs and nursing note reviewed.  Constitutional:      General: She is not in acute distress. HENT:     Head: Normocephalic.  Eyes:     Pupils: Pupils are equal, round, and reactive to light.  Neck:     Musculoskeletal: Normal range of motion.  Cardiovascular:     Rate and Rhythm: Normal rate.  Pulmonary:     Effort: Pulmonary  effort is normal.  Musculoskeletal:     Left lower leg: She exhibits tenderness and laceration.       Legs:     Comments: Left mid-pretibial area has two healing lacerations present.  The lower laceration is developing granulation tissue and has no drainage present.  The  upper laceration appears to be healing but has persistent small amount of serosanguinous drainage present, mild surrounding erythema, and mild surrounding tenderness.  No calf tenderness present.  Skin:    General: Skin is warm and dry.  Neurological:     Mental Status: She is alert.      UC Treatments / Results  Labs (all labs ordered are listed, but only abnormal results are displayed) Labs Reviewed - No data to display  EKG None  Radiology No results found.  Procedures Procedures (including critical care time)  Medications Ordered in UC Medications - No data to display  Initial Impression / Assessment and Plan / UC Course  I have reviewed the triage vital signs and the nursing notes.  Pertinent labs & imaging results that were available during my care of the patient were reviewed by me and considered in my medical decision making (see chart for details).    Applied bacitracin and sterile bandage.  Re-applied ace wraps in graduated compression fashion. Return in about 5 days for follow-up.  If not improving, may need referral to wound care center.   Final Clinical Impressions(s) / UC Diagnoses   Final diagnoses:  Cellulitis of left anterior lower leg     Discharge Instructions     Continue daily dressing change.  Continue to apply ace wraps (recommend obtaining graduated compression support hose).  Continue antibiotics.  Elevate leg whenever possible and apply heating pad 2 to 3 times daily.    ED Prescriptions    None        Kandra Nicolas, MD 01/20/19 [id number]

## 2019-01-09 NOTE — Discharge Instructions (Addendum)
Continue daily dressing change.  Continue to apply ace wraps (recommend obtaining graduated compression support hose).  Continue antibiotics.  Elevate leg whenever possible and apply heating pad 2 to 3 times daily.

## 2019-01-09 NOTE — Telephone Encounter (Signed)
Left voice message inquiring about patients status. Encouraged patient to call with questions or concerns.  

## 2019-01-12 MED FILL — buPROPion HCL ER (XL) 150 M: 150 | 30 days supply | Qty: 30 | Fill #1

## 2019-01-13 ENCOUNTER — Telehealth: Payer: 59 | Admitting: Nurse Practitioner

## 2019-01-13 DIAGNOSIS — B373 Candidiasis of vulva and vagina: Secondary | ICD-10-CM

## 2019-01-13 DIAGNOSIS — B3731 Acute candidiasis of vulva and vagina: Secondary | ICD-10-CM

## 2019-01-13 MED ORDER — FLUCONAZOLE 150 MG PO TABS: 150.0000 mg | ORAL_TABLET | Freq: Once | ORAL | 0 refills | Status: AC

## 2019-01-13 NOTE — Progress Notes (Signed)
We are sorry that you are not feeling well. Here is how we plan to help! Based on what you shared with me it looks like you: May have a yeast vaginosis  Vaginosis is an inflammation of the vagina that can result in discharge, itching and pain. The cause is usually a change in the normal balance of vaginal bacteria or an infection. Vaginosis can also result from reduced estrogen levels after menopause.  The most common causes of vaginosis are:   Bacterial vaginosis which results from an overgrowth of one on several organisms that are normally present in your vagina.   Yeast infections which are caused by a naturally occurring fungus called candida.   Vaginal atrophy (atrophic vaginosis) which results from the thinning of the vagina from reduced estrogen levels after menopause.   Trichomoniasis which is caused by a parasite and is commonly transmitted by sexual intercourse.  Factors that increase your risk of developing vaginosis include: . Medications, such as antibiotics and steroids . Uncontrolled diabetes . Use of hygiene products such as bubble bath, vaginal spray or vaginal deodorant . Douching . Wearing damp or tight-fitting clothing . Using an intrauterine device (IUD) for birth control . Hormonal changes, such as those associated with pregnancy, birth control pills or menopause . Sexual activity . Having a sexually transmitted infection  Your treatment plan is A single Diflucan (fluconazole) 150mg tablet once.  I have electronically sent this prescription into the pharmacy that you have chosen.  Be sure to take all of the medication as directed. Stop taking any medication if you develop a rash, tongue swelling or shortness of breath. Mothers who are breast feeding should consider pumping and discarding their breast milk while on these antibiotics. However, there is no consensus that infant exposure at these doses would be harmful.  Remember that medication creams can weaken latex  condoms. .   HOME CARE:  Good hygiene may prevent some types of vaginosis from recurring and may relieve some symptoms:  . Avoid baths, hot tubs and whirlpool spas. Rinse soap from your outer genital area after a shower, and dry the area well to prevent irritation. Don't use scented or harsh soaps, such as those with deodorant or antibacterial action. . Avoid irritants. These include scented tampons and pads. . Wipe from front to back after using the toilet. Doing so avoids spreading fecal bacteria to your vagina.  Other things that may help prevent vaginosis include:  . Don't douche. Your vagina doesn't require cleansing other than normal bathing. Repetitive douching disrupts the normal organisms that reside in the vagina and can actually increase your risk of vaginal infection. Douching won't clear up a vaginal infection. . Use a latex condom. Both female and female latex condoms may help you avoid infections spread by sexual contact. . Wear cotton underwear. Also wear pantyhose with a cotton crotch. If you feel comfortable without it, skip wearing underwear to bed. Yeast thrives in moist environments Your symptoms should improve in the next day or two.  GET HELP RIGHT AWAY IF:  . You have pain in your lower abdomen ( pelvic area or over your ovaries) . You develop nausea or vomiting . You develop a fever . Your discharge changes or worsens . You have persistent pain with intercourse . You develop shortness of breath, a rapid pulse, or you faint.  These symptoms could be signs of problems or infections that need to be evaluated by a medical provider now.  MAKE SURE YOU      Understand these instructions.  Will watch your condition.  Will get help right away if you are not doing well or get worse.  Your e-visit answers were reviewed by a board certified advanced clinical practitioner to complete your personal care plan. Depending upon the condition, your plan could have included  both over the counter or prescription medications. Please review your pharmacy choice to make sure that you have choses a pharmacy that is open for you to pick up any needed prescription, Your safety is important to us. If you have drug allergies check your prescription carefully.   You can use MyChart to ask questions about today's visit, request a non-urgent call back, or ask for a work or school excuse for 24 hours related to this e-Visit. If it has been greater than 24 hours you will need to follow up with your provider, or enter a new e-Visit to address those concerns. You will get a MyChart message within the next two days asking about your experience. I hope that your e-visit has been valuable and will speed your recovery.  5 minutes spent reviewing and documenting in chart.  

## 2019-01-14 ENCOUNTER — Emergency Department (INDEPENDENT_AMBULATORY_CARE_PROVIDER_SITE_OTHER)
Admission: EM | Admit: 2019-01-14 | Discharge: 2019-01-14 | Disposition: A | Discharge status: Home / Self Care | Payer: 59 | Source: Home / Self Care | Attending: Emergency Medicine | Admitting: Emergency Medicine

## 2019-01-14 ENCOUNTER — Other Ambulatory Visit: Payer: Self-pay

## 2019-01-14 DIAGNOSIS — L03116 Cellulitis of left lower limb: Secondary | ICD-10-CM

## 2019-01-14 DIAGNOSIS — W5501XD Bitten by cat, subsequent encounter: Secondary | ICD-10-CM

## 2019-01-14 MED ORDER — METRONIDAZOLE 500 MG PO TABS: 500.0000 mg | ORAL_TABLET | Freq: Three times a day (TID) | ORAL | 0 refills | Status: DC

## 2019-01-14 MED ORDER — DOXYCYCLINE HYCLATE 100 MG PO CAPS: 100.0000 mg | ORAL_CAPSULE | Freq: Two times a day (BID) | ORAL | 0 refills | Status: DC

## 2019-01-14 MED FILL — metroNIDAZOLE 500 MG TABS: 500 | 7 days supply | Qty: 21 | Fill #0

## 2019-01-14 MED FILL — DOXYCYCLINE HYCLATE 100 MG: 100 | 7 days supply | Qty: 14 | Fill #0

## 2019-01-14 MED FILL — QVAR REDIHALER 80 MCG/ACT A: 80 | 60 days supply | Qty: 11 | Fill #3

## 2019-01-14 NOTE — ED Triage Notes (Signed)
Pt here for f/u of cellulitis on lower left leg due to cat bite on 01/02/19. Dr Assunta Found recommended follow up incase she needs antibiotics extended.

## 2019-01-14 NOTE — Discharge Instructions (Addendum)
Overall, your cat bite wound left leg is improving, but you still have an open tender area with granulation tissue.-Today, I swabbed the open area and sent for wound culture. Today in urgent care, we thoroughly cleansed the area and redressed.  Continue home dressings twice a day as instructed. I know that you have declined referral to wound center or surgical specialist, but if any worsening or other problems, will need to refer to wound center or surgical specialist. Be sure to take probiotics to help prevent secondary GI infection from antibiotics. Return to urgent care in 6 days, March 10, for recheck.  (Of the first day Dr. Assunta Found will be here).-If any concerns or problems before then, come back to urgent care or emergency room that day immediately. May use ibuprofen if needed for pain, as that has helped .(Take ibuprofen with food)

## 2019-01-14 NOTE — ED Provider Notes (Addendum)
[name]    CSN: [id number] Arrival date & time: 01/14/19  1055     History   Chief Complaint Chief Complaint  Patient presents with  . Follow-up    cat bite    HPI Donna Willis is a 58 y.o. female.   HPI Patient presents for follow-up of cellulitis from cat bite, initially seen at [facility] ED 01/02/2019, with follow-up by Dr. Assunta Found at [facility] 2/25, then another follow-up 01/09/2019.  I have reviewed those records. She was initially treated with Augmentin in ER on 2/21 however she had redness and soreness and persistent drainage and Dr. Assunta Found changed her to a 10-day rx of oral doxycycline and metronidazole on 2/25.  Wound culture was negative. She has continued twice a day wound care and redressing at home.  She has declined to see a specialist or a wound care center. On follow-up visit 2/28, she was improving and treatment plan was continued. She returns today for reevaluation.  She states she has been compliant with taking doxycycline twice daily and metronidazole 3 times daily.  Denies side effects on these meds.  Denies any GI side effects and specifically no abdominal pain, nausea, vomiting, diarrhea, change of bowel habits.  Denies fever or chills.  Patient reports that she feels the infection/wound is improving slowly.  Pain left leg at wound site is 2 out of 10 at rest, 4 out of 10 with palpation or cleaning the wound.  She reports minimal light drainage in the superior wound, while the inferior wound is granulating in nicely with less pain and no drainage.   Past Medical History:  Diagnosis Date  . Atypical chest pain 08/2012   Normal Myoview 09/2012  . Borderline hyperlipidemia 05/2018  . GERD (gastroesophageal reflux disease)   . Hay fever    seasonal  . Hernia   . 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 Interstitial cystitis    Dr. Rosana Hoes: hasn't seen him in yrs or so, was on elmiron and coldn't tell much difference.  . MDD  (major depressive disorder)   . Mild persistent asthma 08/31/2013   Stable as of pulm f/u 02/2018: no med changes made---1 yr f/u recommended.  . Personal history of  adenomatous colonic polyp 10/28/2012   10/2012 - 4x10 mm tubular adenoma; repeat 09/03/16 showed NO POLYPS.  Recall 5 yrs (08/2021).    Patient Active Problem List   Diagnosis Date Noted  . MDD (major depressive disorder)   . Chronic interstitial cystitis 06/06/2018  . Cough 10/25/2015  . Lateral epicondylitis of right elbow 01/21/2015  . Right elbow pain 12/28/2014  . Asthma 08/11/2014  . Thrush 08/11/2014  . Chronic cough 08/31/2013  . Mild intermittent asthma in adult without complication 14/97/0263  . Allergic rhinitis 08/31/2013  . 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 History of colonic polyps 10/28/2012  . Health maintenance examination 09/25/2012  . Atypical chest pain 09/04/2012  . Incarcerated umbilical hernia, repaired 08/19/2012. 08/18/2012    Past Surgical History:  Procedure Laterality Date  . CARDIOVASCULAR STRESS TEST  09/2012   Rest/Stress myoview: normal  . CESAREAN SECTION  1988  . CHOLECYSTECTOMY  2002  . COLONOSCOPY W/ POLYPECTOMY  10/22/12; 09/03/16   2013; tubular adenoma, no high grade dysplasia (Dr. Carlean Purl).  Repeat 08/2016: no polyps.  Recall 5 yrs (08/2021--Dr. Carlean Purl).  . CYSTOSCOPY     with bladder bx 07/22/18-->  . HERNIA REPAIR  7858   umbilical, during lap chole  . TONSILLECTOMY  AND ADENOIDECTOMY  1974  . UMBILICAL HERNIA REPAIR  08/19/2012   Procedure: LAPAROSCOPIC UMBILICAL HERNIA;  Surgeon: Shann Medal, MD;  Location: [facility];  Service: General;  Laterality: N/A;    OB History   No obstetric history on file.      Home Medications    Prior to Admission medications   Medication Sig Start Date End Date Taking? Authorizing Provider  beclomethasone (QVAR REDIHALER) 80 MCG/ACT inhaler Inhale 2 puffs into the lungs daily. 02/25/18   Parrett, Fonnie Mu, NP  buPROPion  (WELLBUTRIN XL) 150 MG 24 hr tablet TAKE 1 TABLET BY MOUTH DAILY. 12/16/18   McGowen, Adrian Blackwater, MD  doxycycline (VIBRAMYCIN) 100 MG capsule Take 1 capsule (100 mg total) by mouth 2 (two) times daily. Take one cap PO every 12 hours. Take with food. 01/06/19   Kandra Nicolas, MD  doxycycline (VIBRAMYCIN) 100 MG capsule Take 1 capsule (100 mg total) by mouth 2 (two) times daily. 01/14/19   Jacqulyn Cane, MD  famotidine (PEPCID) 20 MG tablet Take 20 mg by mouth as needed for heartburn or indigestion.    [provider]  FLUoxetine (PROZAC) 20 MG tablet Take 1 tablet (20 mg total) by mouth daily. 10/08/18   McGowen, Adrian Blackwater, MD  ibuprofen (ADVIL,MOTRIN) 200 MG tablet Take 1 tablet by mouth every 6 (six) hours as needed.    [provider]  metroNIDAZOLE (FLAGYL) 500 MG tablet Take one cap PO every 8 hours 01/06/19   Kandra Nicolas, MD  metroNIDAZOLE (FLAGYL) 500 MG tablet Take 1 tablet (500 mg total) by mouth 3 (three) times daily. 01/14/19   Jacqulyn Cane, MD  mupirocin ointment (BACTROBAN) 2 % Apply 1 application topically 3 (three) times daily. 01/06/19   Kandra Nicolas, MD  triamcinolone (NASACORT) 55 MCG/ACT AERO nasal inhaler Place 1 spray into the nose as needed.    [provider]  VENTOLIN HFA 108 (90 Base) MCG/ACT inhaler INHALE 2 PUFFS INTO THE LUNGS EVERY 6 HOURS AS NEEDED FOR WHEEZING. 02/25/18   Parrett, Fonnie Mu, NP    Family History Family History  Problem Relation Age of Onset  . Colon polyps Mother   . Diverticulitis Father   . Other Maternal Uncle        great-stom or colon cancer  . Colon cancer Maternal Uncle   . Rectal cancer Neg Hx     Social History Social History   Tobacco Use  . Smoking status: Never Smoker  . Smokeless tobacco: Never Used  Substance Use Topics  . Alcohol use: No  . Drug use: No     Allergies   Ciprofloxacin   Review of Systems Review of Systems Pertinent items noted in HPI and remainder of comprehensive ROS  otherwise negative.   Physical Exam Triage Vital Signs ED Triage Vitals  Enc Vitals Group     BP 01/14/19 1147 121/80     Pulse Rate 01/14/19 1147 68     Resp 01/14/19 1147 18     Temp 01/14/19 1147 97.8 F (36.6 C)     Temp Source 01/14/19 1147 Oral     SpO2 01/14/19 1147 96 %     Weight 01/14/19 1149 260 lb 2.3 oz (118 kg)     Height --      Head Circumference --      Peak Flow --      Pain Score 01/14/19 1148 3     Pain Loc --  Pain Edu? --      Excl. in [place]? --    No data found.  Updated Vital Signs BP 121/80 (BP Location: Right Arm)   Pulse 68   Temp 97.8 F (36.6 C) (Oral)   Resp 18   Wt 118 kg   SpO2 96%   BMI 43.29 kg/m    Physical Exam Vitals signs reviewed.  Constitutional:      General: She is not in acute distress.    Appearance: She is well-developed.  HENT:     Head: Normocephalic and atraumatic.  Eyes:     General: No scleral icterus.    Pupils: Pupils are equal, round, and reactive to light.  Neck:     Musculoskeletal: Normal range of motion and neck supple.  Cardiovascular:     Rate and Rhythm: Normal rate and regular rhythm.  Pulmonary:     Effort: Pulmonary effort is normal.  Abdominal:     General: There is no distension.     Tenderness: There is no abdominal tenderness.  Musculoskeletal:       Legs:     Comments: As depicted in the diagram, the anterior left lower leg has 2 horizontal lacerations. The inferior laceration is granulating in nicely without any redness or tenderness or drainage and is healing well. The superior laceration has mild tenderness with 8 x 13 mm area of indented ulceration with scant light yellow drainage.  Mild surrounding erythema and mild tenderness.  No warmth. - Patient states this is improving compared to last visit. No red streaks.  No fluctuance or induration. Left leg without edema or calf tenderness.  No bony tenderness.  Homans sign negative.  DP pulse and neurovascular distally intact.  Skin:     General: Skin is warm and dry.  Neurological:     Mental Status: She is alert and oriented to person, place, and time.     Cranial Nerves: No cranial nerve deficit.  Psychiatric:        Behavior: Behavior normal.      UC Treatments / Results  Labs (all labs ordered are listed, but only abnormal results are displayed) Labs Reviewed  WOUND CULTURE    EKG None  Radiology No results found.  Procedures Procedures (including critical care time)  Medications Ordered in UC Medications - No data to display  I have reviewed the triage vital signs and the nursing notes.  Pertinent labs & imaging results that were available during my care of the patient were reviewed by me and considered in my medical decision making (see chart for details).     Impression / Assessment and Plan / UC Course  Cellulitis left leg status post cat bite 01/02/2019.  The cellulitis and wound infection continues to improve, although slowly. There is no sign or history of any symptoms of C. difficile or side effects of her antibiotics. Of note, she has politely refused referral to a wound surgeon or a wound care center.  Options discussed, risk benefits alternatives discussed, and patient agrees with the following: Today, I used a culturette to obtain swab culture of the superior wound with light yellow, scant blood drainage. (Of note, the prior wound culture was negative).  As she is clinically improving on current antibiotic combination, I prescribed 7 more days of oral doxycycline and metronidazole.-Discussed potential risks of prolonged antibiotics, and precautions to be on the look out for, and I also advised taking probiotics daily to help lessen the chance of antibiotic induced GI  symptoms/C Dif. We cleansd the wounds with Hibiclens and nonstick dressing applied. Continue home wound care/dressing changes. Recheck here 6 days, or sooner as needed.  Precautions discussed. Red flags  discussed. Questions invited and answered. Patient voiced understanding and agreement.    Final Clinical Impressions(s) / UC Diagnoses   1)  cat bite, subsequent encounter 2)  cellulitis of left anterior lower leg   Discharge Instructions     Overall, your cat bite wound left leg is improving, but you still have an open tender area with granulation tissue.-Today, I swabbed the open area and sent for wound culture. Today in urgent care, we thoroughly cleansed the area and redressed.  Continue home dressings twice a day as instructed. I know that you have declined referral to wound center or surgical specialist, but if any worsening or other problems, will need to refer to wound center or surgical specialist. Be sure to take probiotics to help prevent secondary GI infection from antibiotics. Return to urgent care in 6 days, March 10, for recheck.  (Of the first day Dr. Assunta Found will be here).-If any concerns or problems before then, come back to urgent care or emergency room that day immediately. May use ibuprofen if needed for pain, as that has helped .(Take ibuprofen with food)    ED Prescriptions    Medication Sig Dispense Auth. Provider   doxycycline (VIBRAMYCIN) 100 MG capsule Take 1 capsule (100 mg total) by mouth 2 (two) times daily. 14 capsule Jacqulyn Cane, MD   metroNIDAZOLE (FLAGYL) 500 MG tablet Take 1 tablet (500 mg total) by mouth 3 (three) times daily. 21 tablet Jacqulyn Cane, MD       Over 25 minutes spent, greater than 50% of the time spent for counseling and coordination of care.     Jacqulyn Cane, MD 01/15/19 [phone]

## 2019-01-15 ENCOUNTER — Encounter: Payer: Self-pay | Admitting: Emergency Medicine

## 2019-01-17 ENCOUNTER — Telehealth: Payer: Self-pay | Admitting: Emergency Medicine

## 2019-01-17 LAB — WOUND CULTURE
MICRO NUMBER:: 275827
RESULT:: NO GROWTH
SPECIMEN QUALITY:: ADEQUATE

## 2019-01-17 NOTE — Telephone Encounter (Signed)
LMTRC

## 2019-01-17 NOTE — Telephone Encounter (Signed)
Patient informed of wound culture results.

## 2019-01-23 ENCOUNTER — Emergency Department (INDEPENDENT_AMBULATORY_CARE_PROVIDER_SITE_OTHER)
Admission: EM | Admit: 2019-01-23 | Discharge: 2019-01-23 | Disposition: A | Discharge status: Home / Self Care | Payer: 59 | Source: Home / Self Care

## 2019-01-23 DIAGNOSIS — Z5189 Encounter for other specified aftercare: Secondary | ICD-10-CM | POA: Diagnosis not present

## 2019-01-23 MED ORDER — MUPIROCIN 2 % EX OINT: 1.0000 "application " | TOPICAL_OINTMENT | Freq: Three times a day (TID) | CUTANEOUS | 1 refills | Status: DC

## 2019-01-23 MED FILL — MUPIROCIN 2% OINTMENT: 2 | 10 days supply | Qty: 22 | Fill #0

## 2019-01-23 NOTE — Discharge Instructions (Addendum)
Continue daily application of mupirocin ointment and non-stick dressing.  Recommend obtaining below-the-knee support hose for daytime use.  Elevate legs when possible.  Recommend daily multiple vitamin.

## 2019-01-23 NOTE — ED Triage Notes (Signed)
Patient here for follow up visit to examine cat bites on left shin from 01/02/19; see notes. Patient feels healing in process and finished her extended dos of metronidazole and doxycycline yesterday. Asymptomatic of infection but the 2 torn areas are still open.

## 2019-01-23 NOTE — ED Provider Notes (Signed)
[facility]    CSN: [id number] Arrival date & time: 01/23/19  1111     History   Chief Complaint Chief Complaint  Patient presents with   Wound Check    HPI Donna Willis is a 58 y.o. female.   Patient returns for re-check of cat bite lacerations/cellulitis on her left pre-tibial area.  She finished her antibiotics yesterday, and continues to apply Bactroban ointment.  She reports that she now has minimal drainage on her bandage each day, and pain/swelling have gradually decreased.     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 Rosana Hoes: hasn't seen him in yrs or so, was on elmiron and coldn't tell much difference.   MDD (major depressive disorder)    Mild persistent asthma 08/31/2013   Stable as of pulm f/u 02/2018: no med changes made---1 yr f/u recommended.   Personal history of  adenomatous colonic polyp 10/28/2012   10/2012 - 4x10 mm tubular adenoma; repeat 09/03/16 showed NO POLYPS.  Recall 5 yrs (08/2021).    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 08/18/2012    Past Surgical History:  Procedure Laterality Date   CARDIOVASCULAR  STRESS TEST  09/2012   Rest/Stress myoview: normal   CESAREAN SECTION  1988   CHOLECYSTECTOMY  2002   COLONOSCOPY W/ POLYPECTOMY  10/22/12; 09/03/16   2013; tubular adenoma, no high grade dysplasia (Dr. Carlean Purl).  Repeat 08/2016: no polyps.  Recall 5 yrs (08/2021--Dr. Carlean Purl).   CYSTOSCOPY     with bladder bx 07/22/18-->   HERNIA REPAIR  0370   umbilical, during lap chole   TONSILLECTOMY AND ADENOIDECTOMY  4888   UMBILICAL HERNIA REPAIR  08/19/2012   Procedure: LAPAROSCOPIC UMBILICAL HERNIA;  Surgeon: Shann Medal, MD;  Location: [facility];  Service: General;  Laterality: N/A;    OB History   No obstetric history on file.      Home Medications    Prior to Admission medications   Medication Sig Start Date End Date Taking? Authorizing Provider  beclomethasone (QVAR REDIHALER) 80 MCG/ACT inhaler Inhale 2 puffs into the lungs daily. 02/25/18   Parrett, Fonnie Mu, NP  buPROPion (WELLBUTRIN XL) 150 MG 24 hr tablet TAKE 1 TABLET BY MOUTH DAILY. 12/16/18   McGowen, Adrian Blackwater, MD  doxycycline (VIBRAMYCIN) 100 MG capsule Take 1 capsule (100 mg total) by mouth 2 (two) times daily. Take one cap PO every 12 hours. Take with food. 01/06/19   Kandra Nicolas, MD  doxycycline (VIBRAMYCIN) 100 MG capsule Take 1 capsule (100 mg total) by mouth 2 (two) times daily. 01/14/19   Jacqulyn Cane, MD  famotidine (PEPCID) 20 MG tablet Take 20 mg by mouth  as needed for heartburn or indigestion.    [provider]  FLUoxetine (PROZAC) 20 MG tablet Take 1 tablet (20 mg total) by mouth daily. 10/08/18   McGowen, Adrian Blackwater, MD  ibuprofen (ADVIL,MOTRIN) 200 MG tablet Take 1 tablet by mouth every 6 (six) hours as needed.    [provider]  metroNIDAZOLE (FLAGYL) 500 MG tablet Take one cap PO every 8 hours 01/06/19   Kandra Nicolas, MD  metroNIDAZOLE (FLAGYL) 500 MG tablet Take 1 tablet (500 mg total) by mouth 3 (three) times daily. 01/14/19   Jacqulyn Cane, MD  mupirocin ointment (BACTROBAN) 2 % Apply  1 application topically 3 (three) times daily. 01/23/19   Kandra Nicolas, MD  triamcinolone (NASACORT) 55 MCG/ACT AERO nasal inhaler Place 1 spray into the nose as needed.    [provider]  VENTOLIN HFA 108 (90 Base) MCG/ACT inhaler INHALE 2 PUFFS INTO THE LUNGS EVERY 6 HOURS AS NEEDED FOR WHEEZING. 02/25/18   Parrett, Fonnie Mu, NP    Family History Family History  Problem Relation Age of Onset   Colon polyps Mother    Diverticulitis Father    Other Maternal Uncle        great-stom or colon cancer   Colon cancer Maternal Uncle    Rectal cancer Neg Hx     Social History Social History   Tobacco Use   Smoking status: Never Smoker   Smokeless tobacco: Never Used  Substance Use Topics   Alcohol use: No   Drug use: No     Allergies   Ciprofloxacin   Review of Systems Review of Systems  Constitutional: Negative for activity change, chills, diaphoresis, fatigue and fever.  Skin: Positive for wound. Negative for color change and rash.  All other systems reviewed and are negative.    Physical Exam Triage Vital Signs ED Triage Vitals [01/23/19 1142]  Enc Vitals Group     BP (!) 144/76     Pulse Rate 69     Resp 18     Temp 97.9 F (36.6 C)     Temp Source Oral     SpO2 97 %     Weight      Height      Head Circumference      Peak Flow      Pain Score      Pain Loc      Pain Edu?      Excl. in [place]?    No data found.  Updated Vital Signs BP (!) 144/76 (BP Location: Right Arm)    Pulse 69    Temp 97.9 F (36.6 C) (Oral)    Resp 18    SpO2 97%   Visual Acuity Right Eye Distance:   Left Eye Distance:   Bilateral Distance:    Right Eye Near:   Left Eye Near:    Bilateral Near:     Physical Exam Vitals signs and nursing note reviewed.  Constitutional:      General: She is not in acute distress. HENT:     Head: Normocephalic.     Nose: Nose normal.     Mouth/Throat:     Mouth: Mucous membranes are moist.  Eyes:     Pupils: Pupils  are equal, round, and reactive to light.  Cardiovascular:     Rate and Rhythm: Normal rate.  Pulmonary:     Effort: Pulmonary effort is normal.  Musculoskeletal:     Left lower leg: She exhibits  tenderness. She exhibits no bony tenderness and no swelling. No edema.       Legs:     Comments: Two laceration/wounds left pre-tibial area both show good granulation tissue without any drainage.  There is still erythema around the wound but it has decreased in diameter and there is less tenderness to palpation.  No calf tenderness.  Skin:    General: Skin is warm and dry.  Neurological:     Mental Status: She is alert.      UC Treatments / Results  Labs (all labs ordered are listed, but only abnormal results are displayed) Labs Reviewed - No data to display  EKG None  Radiology No results found.  Procedures Procedures (including critical care time)  Medications Ordered in UC Medications - No data to display  Initial Impression / Assessment and Plan / UC Course  I have reviewed the triage vital signs and the nursing notes.  Pertinent labs & imaging results that were available during my care of the patient were reviewed by me and considered in my medical decision making (see chart for details).    Wound improving;  continues to heal slowly.  Reapplied bacitracin/bandage.  Refill mupirocin ointment. Return for worsening symptoms. Final Clinical Impressions(s) / UC Diagnoses   Final diagnoses:  Visit for wound check     Discharge Instructions     Continue daily application of mupirocin ointment and non-stick dressing.  Recommend obtaining below-the-knee support hose for daytime use.  Elevate legs when possible.  Recommend daily multiple vitamin.    ED Prescriptions    Medication Sig Dispense Auth. Provider   mupirocin ointment (BACTROBAN) 2 % Apply 1 application topically 3 (three) times daily. 30 g Kandra Nicolas, MD        Kandra Nicolas, MD 01/27/19 1024

## 2019-02-02 MED FILL — FLUoxetine HCL 20 MG CAPS: 20 | 90 days supply | Qty: 90 | Fill #0

## 2019-02-17 MED FILL — buPROPion HCL ER (XL) 150 M: 150 | 30 days supply | Qty: 30 | Fill #2

## 2019-03-13 MED FILL — buPROPion HCL ER (XL) 150 M: 150 | 30 days supply | Qty: 30 | Fill #3

## 2019-03-30 ENCOUNTER — Other Ambulatory Visit: Payer: Self-pay | Admitting: Adult Health

## 2019-03-30 MED FILL — QVAR REDIHALER 80 MCG/ACT A: 80 | 30 days supply | Qty: 11 | Fill #0

## 2019-04-08 ENCOUNTER — Encounter: Payer: 59 | Admitting: Family Medicine

## 2019-04-13 DIAGNOSIS — K439 Ventral hernia without obstruction or gangrene: Secondary | ICD-10-CM

## 2019-04-13 HISTORY — DX: Ventral hernia without obstruction or gangrene: K43.9

## 2019-04-13 MED FILL — buPROPion HCL ER (XL) 150 M: 150 | 30 days supply | Qty: 30 | Fill #4

## 2019-04-26 ENCOUNTER — Emergency Department (HOSPITAL_COMMUNITY)
Admission: EM | Admit: 2019-04-26 | Discharge: 2019-04-26 | Disposition: A | Discharge status: Home / Self Care | Payer: 59 | Attending: Emergency Medicine | Admitting: Emergency Medicine

## 2019-04-26 ENCOUNTER — Other Ambulatory Visit: Payer: Self-pay

## 2019-04-26 ENCOUNTER — Emergency Department (HOSPITAL_COMMUNITY): Payer: 59

## 2019-04-26 ENCOUNTER — Encounter (HOSPITAL_COMMUNITY): Payer: Self-pay | Admitting: Emergency Medicine

## 2019-04-26 DIAGNOSIS — J45909 Unspecified asthma, uncomplicated: Secondary | ICD-10-CM | POA: Diagnosis not present

## 2019-04-26 DIAGNOSIS — Z79899 Other long term (current) drug therapy: Secondary | ICD-10-CM | POA: Diagnosis not present

## 2019-04-26 DIAGNOSIS — K439 Ventral hernia without obstruction or gangrene: Secondary | ICD-10-CM | POA: Diagnosis not present

## 2019-04-26 DIAGNOSIS — R109 Unspecified abdominal pain: Secondary | ICD-10-CM | POA: Diagnosis present

## 2019-04-26 DIAGNOSIS — R1110 Vomiting, unspecified: Secondary | ICD-10-CM | POA: Diagnosis not present

## 2019-04-26 LAB — CBC WITH DIFFERENTIAL/PLATELET
Abs Immature Granulocytes: 0.07 10*3/uL (ref 0.00–0.07)
Basophils Absolute: 0.1 10*3/uL (ref 0.0–0.1)
Basophils Relative: 1 %
Eosinophils Absolute: 0.1 10*3/uL (ref 0.0–0.5)
Eosinophils Relative: 1 %
HCT: 43.5 % (ref 36.0–46.0)
Hemoglobin: 13.8 g/dL (ref 12.0–15.0)
Immature Granulocytes: 1 %
Lymphocytes Relative: 8 %
Lymphs Abs: 0.8 10*3/uL (ref 0.7–4.0)
MCH: 28.3 pg (ref 26.0–34.0)
MCHC: 31.7 g/dL (ref 30.0–36.0)
MCV: 89.1 fL (ref 80.0–100.0)
Monocytes Absolute: 0.5 10*3/uL (ref 0.1–1.0)
Monocytes Relative: 5 %
Neutro Abs: 8.1 10*3/uL — ABNORMAL HIGH (ref 1.7–7.7)
Neutrophils Relative %: 84 %
Platelets: 296 10*3/uL (ref 150–400)
RBC: 4.88 MIL/uL (ref 3.87–5.11)
RDW: 13.5 % (ref 11.5–15.5)
WBC: 9.6 10*3/uL (ref 4.0–10.5)
nRBC: 0 % (ref 0.0–0.2)

## 2019-04-26 LAB — COMPREHENSIVE METABOLIC PANEL
ALT: 24 U/L (ref 0–44)
AST: 22 U/L (ref 15–41)
Albumin: 3.6 g/dL (ref 3.5–5.0)
Alkaline Phosphatase: 55 U/L (ref 38–126)
Anion gap: 10 (ref 5–15)
BUN: 8 mg/dL (ref 6–20)
CO2: 22 mmol/L (ref 22–32)
Calcium: 8.9 mg/dL (ref 8.9–10.3)
Chloride: 107 mmol/L (ref 98–111)
Creatinine, Ser: 0.81 mg/dL (ref 0.44–1.00)
GFR calc Af Amer: >60 mL/min (ref >60)
GFR calc non Af Amer: >60 mL/min (ref >60)
Glucose, Bld: 128 mg/dL — ABNORMAL HIGH (ref 70–99)
Potassium: 3.6 mmol/L (ref 3.5–5.1)
Sodium: 139 mmol/L (ref 135–145)
Total Bilirubin: 0.4 mg/dL (ref 0.3–1.2)
Total Protein: 7.6 g/dL (ref 6.5–8.1)

## 2019-04-26 LAB — URINALYSIS, ROUTINE W REFLEX MICROSCOPIC
Bilirubin Urine: NEGATIVE
Glucose, UA: NEGATIVE mg/dL
Ketones, ur: NEGATIVE mg/dL
Nitrite: NEGATIVE
Protein, ur: 100 mg/dL — AB
RBC / HPF: >50 RBC/hpf — ABNORMAL HIGH (ref 0–5)
Specific Gravity, Urine: 1.021 (ref 1.005–1.030)
pH: 5 (ref 5.0–8.0)

## 2019-04-26 LAB — LIPASE, BLOOD: Lipase: 23 U/L (ref 11–51)

## 2019-04-26 MED ORDER — IOHEXOL 300 MG/ML  SOLN
100.0000 mL | Freq: Once | INTRAMUSCULAR | Status: AC | PRN
  Administered 2019-04-26: 100 mL via INTRAVENOUS

## 2019-04-26 MED ORDER — ONDANSETRON 4 MG PO TBDP: ORAL_TABLET | ORAL | 0 refills | Status: DC

## 2019-04-26 MED ORDER — SODIUM CHLORIDE 0.9 % IV BOLUS
500.0000 mL | Freq: Once | INTRAVENOUS | Status: AC
  Administered 2019-04-26: 500 mL via INTRAVENOUS

## 2019-04-26 MED ORDER — ONDANSETRON HCL 4 MG/2ML IJ SOLN
4.0000 mg | Freq: Once | INTRAMUSCULAR | Status: AC
  Administered 2019-04-26: 4 mg via INTRAVENOUS
  Filled 2019-04-26: qty 2

## 2019-04-26 MED ORDER — MORPHINE SULFATE (PF) 4 MG/ML IV SOLN
4.0000 mg | Freq: Once | INTRAVENOUS | Status: AC
  Administered 2019-04-26: 4 mg via INTRAVENOUS
  Filled 2019-04-26: qty 1

## 2019-04-26 NOTE — ED Provider Notes (Signed)
[facility] EMERGENCY DEPARTMENT Provider Note   CSN: [id number] Arrival date & time: 04/26/19  1049    History   Chief Complaint Chief Complaint  Patient presents with  . Abdominal Pain    HPI Donna Willis is a 58 y.o. female.     Patient is a 58 year old female who presents with abdominal pain.  She states she was feeling okay this morning and then she had a sudden onset of nausea and vomiting.  Her emesis is nonbilious and nonbloody.  After the vomiting she noted some significant pain in her mid abdomen.  She also noticed a bulge where she has had a prior hernia repair.  She had normal bowel movements this morning.  No urinary symptoms.  No known fevers.     Past Medical History:  Diagnosis Date  . Atypical chest pain 08/2012   Normal Myoview 09/2012  . Borderline hyperlipidemia 05/2018  . GERD (gastroesophageal reflux disease)   . Hay fever    seasonal  . Hernia   . 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 Interstitial cystitis    Dr. Rosana Hoes: hasn't seen him in yrs or so, was on elmiron and coldn't tell much difference.  . MDD (major depressive disorder)   . Mild persistent asthma 08/31/2013   Stable as of pulm f/u 02/2018: no med changes made---1 yr f/u recommended.  . Personal history of  adenomatous colonic polyp 10/28/2012   10/2012 - 4x10 mm tubular adenoma; repeat 09/03/16 showed NO POLYPS.  Recall 5 yrs (08/2021).    Patient Active Problem List   Diagnosis Date Noted  . MDD (major depressive disorder)   . Chronic interstitial cystitis 06/06/2018  . Cough 10/25/2015  . Lateral epicondylitis of right elbow 01/21/2015  . Right elbow pain 12/28/2014  . Asthma 08/11/2014  . Thrush 08/11/2014  . Chronic cough 08/31/2013  . Mild intermittent asthma in adult without complication 78/93/8101  . Allergic rhinitis 08/31/2013  . 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 History of colonic polyps 10/28/2012  . Health maintenance examination  09/25/2012  . Atypical chest pain 09/04/2012  . Incarcerated umbilical hernia, repaired 08/19/2012. 08/18/2012    Past Surgical History:  Procedure Laterality Date  . CARDIOVASCULAR STRESS TEST  09/2012   Rest/Stress myoview: normal  . CESAREAN SECTION  1988  . CHOLECYSTECTOMY  2002  . COLONOSCOPY W/ POLYPECTOMY  10/22/12; 09/03/16   2013; tubular adenoma, no high grade dysplasia (Dr. Carlean Purl).  Repeat 08/2016: no polyps.  Recall 5 yrs (08/2021--Dr. Carlean Purl).  . CYSTOSCOPY     with bladder bx 07/22/18-->  . HERNIA REPAIR  7510   umbilical, during lap chole  . TONSILLECTOMY AND ADENOIDECTOMY  1974  . UMBILICAL HERNIA REPAIR  08/19/2012   Procedure: LAPAROSCOPIC UMBILICAL HERNIA;  Surgeon: Shann Medal, MD;  Location: [facility];  Service: General;  Laterality: N/A;     OB History   No obstetric history on file.      Home Medications    Prior to Admission medications   Medication Sig Start Date End Date Taking? Authorizing Provider  buPROPion (WELLBUTRIN XL) 150 MG 24 hr tablet TAKE 1 TABLET BY MOUTH DAILY. Patient taking differently: Take 150 mg by mouth daily.  12/16/18  Yes McGowen, Adrian Blackwater, MD  famotidine (PEPCID) 20 MG tablet Take 20 mg by mouth as needed for heartburn or indigestion.   Yes [provider]  FLUoxetine (PROZAC) 20 MG tablet Take 1 tablet (20 mg total) by mouth daily. 10/08/18  Yes McGowen, Adrian Blackwater, MD  ibuprofen (ADVIL,MOTRIN) 200 MG tablet Take 1 tablet by mouth every 6 (six) hours as needed.   Yes [provider]  QVAR REDIHALER 80 MCG/ACT inhaler INHALE 2 PUFFS INTO THE LUNGS DAILY. Patient taking differently: Inhale 2 puffs into the lungs daily at 12 noon.  03/30/19  Yes Parrett, Tammy S, NP  triamcinolone (NASACORT) 55 MCG/ACT AERO nasal inhaler Place 1 spray into the nose as needed.   Yes [provider]  VENTOLIN HFA 108 (90 Base) MCG/ACT inhaler INHALE 2 PUFFS INTO THE LUNGS EVERY 6 HOURS AS NEEDED FOR WHEEZING. Patient taking  differently: Inhale 2 puffs into the lungs every 6 (six) hours as needed for shortness of breath. INHALE 2 PUFFS INTO THE LUNGS EVERY 6 HOURS AS NEEDED FOR WHEEZING. 02/25/18  Yes Parrett, Tammy S, NP  doxycycline (VIBRAMYCIN) 100 MG capsule Take 1 capsule (100 mg total) by mouth 2 (two) times daily. Take one cap PO every 12 hours. Take with food. Patient not taking: Reported on 04/26/2019 01/06/19   Kandra Nicolas, MD  doxycycline (VIBRAMYCIN) 100 MG capsule Take 1 capsule (100 mg total) by mouth 2 (two) times daily. Patient not taking: Reported on 04/26/2019 01/14/19   Jacqulyn Cane, MD  metroNIDAZOLE (FLAGYL) 500 MG tablet Take one cap PO every 8 hours Patient not taking: Reported on 04/26/2019 01/06/19   Kandra Nicolas, MD  metroNIDAZOLE (FLAGYL) 500 MG tablet Take 1 tablet (500 mg total) by mouth 3 (three) times daily. Patient not taking: Reported on 04/26/2019 01/14/19   Jacqulyn Cane, MD  mupirocin ointment (BACTROBAN) 2 % Apply 1 application topically 3 (three) times daily. Patient not taking: Reported on 04/26/2019 01/23/19   Kandra Nicolas, MD  ondansetron (ZOFRAN ODT) 4 MG disintegrating tablet 4mg  ODT q4 hours prn nausea/vomit 04/26/19   Malvin Johns, MD    Family History Family History  Problem Relation Age of Onset  . Colon polyps Mother   . Diverticulitis Father   . Other Maternal Uncle        great-stom or colon cancer  . Colon cancer Maternal Uncle   . Rectal cancer Neg Hx     Social History Social History   Tobacco Use  . Smoking status: Never Smoker  . Smokeless tobacco: Never Used  Substance Use Topics  . Alcohol use: No  . Drug use: No     Allergies   Ciprofloxacin   Review of Systems Review of Systems  Constitutional: Negative for chills, diaphoresis, fatigue and fever.  HENT: Negative for congestion, rhinorrhea and sneezing.   Eyes: Negative.   Respiratory: Negative for cough, chest tightness and shortness of breath.   Cardiovascular: Negative for chest  pain and leg swelling.  Gastrointestinal: Positive for abdominal pain, nausea and vomiting. Negative for blood in stool and diarrhea.  Genitourinary: Negative for difficulty urinating, flank pain, frequency and hematuria.  Musculoskeletal: Negative for arthralgias and back pain.  Skin: Negative for rash.  Neurological: Negative for dizziness, speech difficulty, weakness, numbness and headaches.     Physical Exam Updated Vital Signs BP (!) 121/97 (BP Location: Right Arm)   Pulse 73   Temp 97.9 F (36.6 C) (Oral)   Resp 16   Ht 5' 5.5" (1.664 m)   Wt 117.9 kg   SpO2 98%   BMI 42.61 kg/m   Physical Exam Constitutional:      Appearance: She is well-developed.  HENT:     Head: Normocephalic and atraumatic.  Eyes:  Pupils: Pupils are equal, round, and reactive to light.  Neck:     Musculoskeletal: Normal range of motion and neck supple.  Cardiovascular:     Rate and Rhythm: Normal rate and regular rhythm.     Heart sounds: Normal heart sounds.  Pulmonary:     Effort: Pulmonary effort is normal. No respiratory distress.     Breath sounds: Normal breath sounds. No wheezing or rales.  Chest:     Chest wall: No tenderness.  Abdominal:     General: Bowel sounds are normal.     Palpations: Abdomen is soft.     Tenderness: There is abdominal tenderness (Positive tenderness in her right mid abdomen.  There is what appears to be small protrusion in this area.). There is no guarding or rebound.  Musculoskeletal: Normal range of motion.  Lymphadenopathy:     Cervical: No cervical adenopathy.  Skin:    General: Skin is warm and dry.     Findings: No rash.  Neurological:     Mental Status: She is alert and oriented to person, place, and time.      ED Treatments / Results  Labs (all labs ordered are listed, but only abnormal results are displayed) Labs Reviewed  COMPREHENSIVE METABOLIC PANEL - Abnormal; Notable for the following components:      Result Value   Glucose, Bld  128 (*)    All other components within normal limits  CBC WITH DIFFERENTIAL/PLATELET - Abnormal; Notable for the following components:   Neutro Abs 8.1 (*)    All other components within normal limits  URINALYSIS, ROUTINE W REFLEX MICROSCOPIC - Abnormal; Notable for the following components:   APPearance TURBID (*)    Hgb urine dipstick LARGE (*)    Protein, ur 100 (*)    Leukocytes,Ua TRACE (*)    RBC / HPF >50 (*)    Bacteria, UA FEW (*)    All other components within normal limits  LIPASE, BLOOD    EKG None  Radiology Ct Abdomen Pelvis W Contrast  Result Date: 04/26/2019 CLINICAL DATA:  Nausea with mid and upper abdominal pain this morning, in addition of vomiting. EXAM: CT ABDOMEN AND PELVIS WITH CONTRAST TECHNIQUE: Multidetector CT imaging of the abdomen and pelvis was performed using the standard protocol following bolus administration of intravenous contrast. CONTRAST:  141mL OMNIPAQUE IOHEXOL 300 MG/ML  SOLN COMPARISON:  07/22/2007 FINDINGS: Lower chest: Calcified granuloma in the left lower lobe just above the hemidiaphragm, stable granulomatous disease. Hepatobiliary: 1.1 by 0.8 cm hypodense lesion the lateral segment left hepatic lobe on image 19/3, not changed from 07/22/2007. Likely a cyst. Cholecystectomy.  No biliary dilatation. Pancreas: Fatty atrophy, otherwise unremarkable. Spleen: Unremarkable Adrenals/Urinary Tract: There is abnormal stranding along the anterior margin of the urinary bladder reason the possibility of cystitis. The adrenal glands and kidneys appear unremarkable. Stomach/Bowel: Unremarkable.  Appendix normal. Vascular/Lymphatic: Unremarkable Reproductive: Unremarkable Other: No supplemental non-categorized findings. Musculoskeletal: Right supraumbilical hernia containing adipose tissue with herniated tissue measuring 9.9 by 10.3 by 4.6 cm (volume = 250 cm^3) and the hernia neck measuring 3.0 cm in diameter. This extends just above a hernia mesh. Down along the  central hernia mesh there is a suggestion of a 5 cm umbilical hernia although this could represent fat necrosis instead, or part of the patient's original billable hernia which was not reducible prior to mesh placement. Multilevel lumbar spondylosis and degenerative disc disease. IMPRESSION: 1. Abnormal stranding in the adipose tissue anterior to the urinary bladder, suspicious  for possible cystitis. 2. Right eccentric supraumbilical hernia containing about 250 cubic cm of omental adipose tissue. Possible smaller hernia in the umbilical region. Hernia mesh noted. No herniated bowel. 3. Lumbar spondylosis and degenerative disc disease. Electronically Signed   By: Van Clines M.D.   On: 04/26/2019 13:55    Procedures Procedures (including critical care time)  Medications Ordered in ED Medications  sodium chloride 0.9 % bolus 500 mL (0 mLs Intravenous Stopped 04/26/19 1304)  morphine 4 MG/ML injection 4 mg (4 mg Intravenous Given 04/26/19 1151)  ondansetron (ZOFRAN) injection 4 mg (4 mg Intravenous Given 04/26/19 1151)  iohexol (OMNIPAQUE) 300 MG/ML solution 100 mL (100 mLs Intravenous Contrast Given 04/26/19 1311)     Initial Impression / Assessment and Plan / ED Course  I have reviewed the triage vital signs and the nursing notes.  Pertinent labs & imaging results that were available during my care of the patient were reviewed by me and considered in my medical decision making (see chart for details).        CT scan shows no evidence of obstructive hernia.  There is some small adipose tissue and a ventral hernia.  There are no loops of bowel in the hernia.  Her pain is markedly improved.  She has no vomiting.  She is tolerating oral fluids.  She has not required any further pain medication.  She was discharged home in good condition.  Her labs are non-concerning.  She was encouraged to follow-up with [place] surgery regarding hernia.  She was given strict return precautions to  return if she has worsening pain, fevers, vomiting or other worsening symptoms.  She had some stranding on her bladder but she is not having current symptoms of UTI.  She has a history of interstitial cystitis.  Her urine does not appear consistent with acute infection.  It was sent for culture.  She was not started on antibiotics.  Final Clinical Impressions(s) / ED Diagnoses   Final diagnoses:  Abdominal wall hernia    ED Discharge Orders         Ordered    ondansetron (ZOFRAN ODT) 4 MG disintegrating tablet     04/26/19 1450           Malvin Johns, MD 04/26/19 1537

## 2019-04-26 NOTE — ED Notes (Signed)
ED Provider at bedside. 

## 2019-04-26 NOTE — ED Notes (Signed)
Patient transported to CT 

## 2019-04-26 NOTE — ED Notes (Signed)
Patient returned to room CT.

## 2019-04-26 NOTE — ED Triage Notes (Signed)
Patient arrived via POV after becoming nauseous and then having upper mid abdominal pain today . Reports feeling as pressure after eating. Vomiting X4. Reports 2 episodes of runny stools. Does not have gallbladder. Does have appendix.

## 2019-04-30 ENCOUNTER — Other Ambulatory Visit (HOSPITAL_COMMUNITY): Payer: Self-pay | Admitting: Surgery

## 2019-04-30 DIAGNOSIS — K439 Ventral hernia without obstruction or gangrene: Secondary | ICD-10-CM | POA: Diagnosis not present

## 2019-05-08 MED FILL — FLUoxetine HCL 20 MG CAPS: 20 | 90 days supply | Qty: 90 | Fill #1

## 2019-05-12 ENCOUNTER — Encounter: Payer: Self-pay | Admitting: Family Medicine

## 2019-05-12 MED FILL — buPROPion HCL ER (XL) 150 M: 150 | 30 days supply | Qty: 30 | Fill #5

## 2019-06-02 ENCOUNTER — Ambulatory Visit: Payer: 59 | Admitting: Adult Health

## 2019-06-02 ENCOUNTER — Other Ambulatory Visit: Payer: Self-pay | Admitting: Adult Health

## 2019-06-02 ENCOUNTER — Other Ambulatory Visit: Payer: Self-pay

## 2019-06-02 ENCOUNTER — Encounter: Payer: Self-pay | Admitting: Adult Health

## 2019-06-02 ENCOUNTER — Telehealth: Payer: Self-pay | Admitting: Adult Health

## 2019-06-02 DIAGNOSIS — J4530 Mild persistent asthma, uncomplicated: Secondary | ICD-10-CM

## 2019-06-02 DIAGNOSIS — J309 Allergic rhinitis, unspecified: Secondary | ICD-10-CM | POA: Diagnosis not present

## 2019-06-02 MED ORDER — VENTOLIN HFA 108 (90 BASE) MCG/ACT IN AERS: INHALATION_SPRAY | RESPIRATORY_TRACT | 5 refills | Status: DC

## 2019-06-02 MED ORDER — QVAR REDIHALER 80 MCG/ACT IN AERB: INHALATION_SPRAY | RESPIRATORY_TRACT | 11 refills | Status: DC

## 2019-06-02 MED FILL — ALBUTEROL SULFATE HFA 108 (: 108 (90 BAS | 25 days supply | Qty: 18 | Fill #0

## 2019-06-02 MED FILL — QVAR REDIHALER 80 MCG/ACT A: 80 | 60 days supply | Qty: 11 | Fill #0

## 2019-06-02 NOTE — Assessment & Plan Note (Signed)
Excellent control on Qvar  Plan  Patient Instructions  Continue on Qvar 2 puffs daily, rinse after use May use Ventolin as needed for wheezing. Follow-up with Dr. Halford Chessman  Or Cheyna Retana NP in 1 year and as needed

## 2019-06-02 NOTE — Assessment & Plan Note (Signed)
Trigger prevention.  Plan  Patient Instructions  Continue on Qvar 2 puffs daily, rinse after use May use Ventolin as needed for wheezing. Follow-up with Dr. Halford Chessman  Or Parrett NP in 1 year and as needed

## 2019-06-02 NOTE — Telephone Encounter (Signed)
Called & spoke w/ Baxter Flattery from [facility]. Baxter Flattery states the Ventolin prescription would be very expensive for patient and inquired if it would be okay to use the generic version so that patient will be able to afford it. I let her know this would be fine as there are no differences in the medications. Baxter Flattery verbalized understanding with no additional questions. Nothing further needed at this time.

## 2019-06-02 NOTE — Patient Instructions (Signed)
Continue on Qvar 2 puffs daily, rinse after use May use Ventolin as needed for wheezing. Follow-up with Dr. Halford Chessman  Or Audel Coakley NP in 1 year and as needed

## 2019-06-02 NOTE — Telephone Encounter (Signed)
Is it appropriate for pt to use generic ?

## 2019-06-02 NOTE — Progress Notes (Signed)
@Patient  ID: Donna Willis, female    DOB: 1961-02-06, 58 y.o.   MRN: [id number]  Chief Complaint  Patient presents with  . Follow-up    Asthma     Referring provider: Tammi Sou, MD  HPI: 58 year old female never smoker followed for asthma RN for [facility]   TEST/EVENTS :  PFT 2014 showed normal lung function with no airflow obstruction or restriction. DLCO was normal.  Mid flow reversibility  06/02/2019 Follow up : Asthma  Patient presents for a one-year follow-up.  Patient has mild persistent asthma.  Is on Qvar 2 puffs daily.  Says that she has been doing well with her breathing.  She denies any cough or wheezing.  No increased shortness of breath.  No decreased activity tolerance.  No increased albuterol use.  Says she never has to use her albuterol.  Says she has having abdominal hernia surgery in the next few weeks.   Allergies  Allergen Reactions  . Ciprofloxacin Itching    Immunization History  Administered Date(s) Administered  . Influenza Split 08/19/2012, 09/02/2013, 08/02/2014, 08/25/2015, 08/12/2017  . Influenza,inj,Quad PF,6+ Mos 07/01/2018  . Tdap 04/30/2016  . Zoster Recombinat (Shingrix) 06/03/2018, 08/27/2018    Past Medical History:  Diagnosis Date  . Atypical chest pain 08/2012   Normal Myoview 09/2012  . Borderline hyperlipidemia 05/2018  . Epigastric hernia 04/2019   Surgery planned by Dr. Lucia Gaskins as of 04/30/19  . GERD (gastroesophageal reflux disease)   . Hay fever    seasonal  . Hernia   . 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 Interstitial cystitis    Dr. Rosana Hoes: hasn't seen him in yrs or so, was on elmiron and coldn't tell much difference.  . MDD (major depressive disorder)   . Mild persistent asthma 08/31/2013   Stable as of pulm f/u 02/2018: no med changes made---1 yr f/u recommended.  . Personal history of  adenomatous colonic polyp 10/28/2012   10/2012 - 4x10 mm tubular adenoma; repeat 09/03/16 showed NO POLYPS.  Recall 5 yrs  (08/2021).    Tobacco History: Social History   Tobacco Use  Smoking Status Never Smoker  Smokeless Tobacco Never Used   Counseling given: Not Answered   Outpatient Medications Prior to Visit  Medication Sig Dispense Refill  . buPROPion (WELLBUTRIN XL) 150 MG 24 hr tablet TAKE 1 TABLET BY MOUTH DAILY. (Patient taking differently: Take 150 mg by mouth daily. ) 30 tablet 5  . famotidine (PEPCID) 20 MG tablet Take 20 mg by mouth as needed for heartburn or indigestion.    Marland Kitchen FLUoxetine (PROZAC) 20 MG tablet Take 1 tablet (20 mg total) by mouth daily. 30 tablet 3  . ibuprofen (ADVIL,MOTRIN) 200 MG tablet Take 1 tablet by mouth every 6 (six) hours as needed.    . metroNIDAZOLE (FLAGYL) 500 MG tablet Take one cap PO every 8 hours (Patient not taking: Reported on 04/26/2019) 30 tablet 0  . ondansetron (ZOFRAN ODT) 4 MG disintegrating tablet 4mg  ODT q4 hours prn nausea/vomit 8 tablet 0  . triamcinolone (NASACORT) 55 MCG/ACT AERO nasal inhaler Place 1 spray into the nose as needed.    . doxycycline (VIBRAMYCIN) 100 MG capsule Take 1 capsule (100 mg total) by mouth 2 (two) times daily. Take one cap PO every 12 hours. Take with food. (Patient not taking: Reported on 04/26/2019) 20 capsule 0  . doxycycline (VIBRAMYCIN) 100 MG capsule Take 1 capsule (100 mg total) by mouth 2 (two) times daily. (Patient not taking: Reported on  04/26/2019) 14 capsule 0  . metroNIDAZOLE (FLAGYL) 500 MG tablet Take 1 tablet (500 mg total) by mouth 3 (three) times daily. (Patient not taking: Reported on 04/26/2019) 21 tablet 0  . mupirocin ointment (BACTROBAN) 2 % Apply 1 application topically 3 (three) times daily. (Patient not taking: Reported on 04/26/2019) 30 g 1  . QVAR REDIHALER 80 MCG/ACT inhaler INHALE 2 PUFFS INTO THE LUNGS DAILY. (Patient taking differently: Inhale 2 puffs into the lungs daily at 12 noon. ) 10.6 g 0  . VENTOLIN HFA 108 (90 Base) MCG/ACT inhaler INHALE 2 PUFFS INTO THE LUNGS EVERY 6 HOURS AS NEEDED FOR  WHEEZING. (Patient taking differently: Inhale 2 puffs into the lungs every 6 (six) hours as needed for shortness of breath. INHALE 2 PUFFS INTO THE LUNGS EVERY 6 HOURS AS NEEDED FOR WHEEZING.) 18 g 5   No facility-administered medications prior to visit.      Review of Systems:   Constitutional:   No  weight loss, night sweats,  Fevers, chills, fatigue, or  lassitude.  HEENT:   No headaches,  Difficulty swallowing,  Tooth/dental problems, or  Sore throat,                No sneezing, itching, ear ache, nasal congestion, post nasal drip,   CV:  No chest pain,  Orthopnea, PND, swelling in lower extremities, anasarca, dizziness, palpitations, syncope.   GI  No heartburn, indigestion,  nausea, vomiting, diarrhea, change in bowel habits, loss of appetite, bloody stools.  Intermittent abdominal pain  Resp: No shortness of breath with exertion or at rest.  No excess mucus, no productive cough,  No non-productive cough,  No coughing up of blood.  No change in color of mucus.  No wheezing.  No chest wall deformity  Skin: no rash or lesions.  GU: no dysuria, change in color of urine, no urgency or frequency.  No flank pain, no hematuria   MS:  No joint pain or swelling.  No decreased range of motion.  No back pain.    Physical Exam  BP 122/68 (BP Location: Left Arm, Cuff Size: Normal)   Pulse 87   Temp 97.9 F (36.6 C) (Oral)   Ht 5' 4.5" (1.638 m)   Wt 257 lb 3.2 oz (116.7 kg)   SpO2 97%   BMI 43.47 kg/m   GEN: A/Ox3; pleasant , NAD, obese   HEENT:  [place]/AT,  NOSE-clear, THROAT-clear, no lesions, no postnasal drip or exudate noted.   NECK:  Supple w/ fair ROM; no JVD; normal carotid impulses w/o bruits; no thyromegaly or nodules palpated; no lymphadenopathy.    RESP  Clear  P & A; w/o, wheezes/ rales/ or rhonchi. no accessory muscle use, no dullness to percussion  CARD:  RRR, no m/r/g, tr peripheral edema, pulses intact, no cyanosis or clubbing.  GI:   Soft & nt; nml bowel  sounds; no organomegaly or masses detected.   Musco: Warm bil, no deformities or joint swelling noted.   Neuro: alert, no focal deficits noted.    Skin: Warm, no lesions or rashes    Lab Results:  CBC    Component Value Date/Time   WBC 9.6 04/26/2019 1133   RBC 4.88 04/26/2019 1133   HGB 13.8 04/26/2019 1133   HCT 43.5 04/26/2019 1133   PLT 296 04/26/2019 1133   MCV 89.1 04/26/2019 1133   MCV 87.7 08/18/2012 1138   MCH 28.3 04/26/2019 1133   MCHC 31.7 04/26/2019 1133   RDW 13.5  04/26/2019 1133   LYMPHSABS 0.8 04/26/2019 1133   MONOABS 0.5 04/26/2019 1133   EOSABS 0.1 04/26/2019 1133   BASOSABS 0.1 04/26/2019 1133    BMET    Component Value Date/Time   NA 139 04/26/2019 1133   K 3.6 04/26/2019 1133   CL 107 04/26/2019 1133   CO2 22 04/26/2019 1133   GLUCOSE 128 (H) 04/26/2019 1133   BUN 8 04/26/2019 1133   CREATININE 0.81 04/26/2019 1133   CALCIUM 8.9 04/26/2019 1133   GFRNONAA >60 04/26/2019 1133   GFRAA >60 04/26/2019 1133    BNP No results found for: BNP  ProBNP No results found for: PROBNP  Imaging: No results found.    PFT Results Latest Ref Rng & Units 10/28/2013  FVC-Pre L 3.02  FVC-Predicted Pre % 86  FVC-Post L 3.14  FVC-Predicted Post % 89  Pre FEV1/FVC % % 81  Post FEV1/FCV % % 84  FEV1-Pre L 2.43  FEV1-Predicted Pre % 88  FEV1-Post L 2.65  DLCO UNC% % 127  DLCO COR %Predicted % 136  TLC L 4.65  TLC % Predicted % 92  RV % Predicted % 63    No results found for: NITRICOXIDE      Assessment & Plan:   Asthma Excellent control on Qvar  Plan  Patient Instructions  Continue on Qvar 2 puffs daily, rinse after use May use Ventolin as needed for wheezing. Follow-up with Dr. Halford Chessman  Or Lindsy Cerullo NP in 1 year and as needed     Allergic rhinitis Trigger prevention.  Plan  Patient Instructions  Continue on Qvar 2 puffs daily, rinse after use May use Ventolin as needed for wheezing. Follow-up with Dr. Halford Chessman  Or Gabryela Kimbrell NP in 1  year and as needed        Rexene Edison, NP 06/02/2019

## 2019-06-10 ENCOUNTER — Encounter: Payer: 59 | Attending: Surgery | Admitting: Dietician

## 2019-06-10 ENCOUNTER — Other Ambulatory Visit: Payer: Self-pay

## 2019-06-10 ENCOUNTER — Encounter: Payer: Self-pay | Admitting: Dietician

## 2019-06-10 DIAGNOSIS — E669 Obesity, unspecified: Secondary | ICD-10-CM | POA: Insufficient documentation

## 2019-06-10 NOTE — Progress Notes (Signed)
Medical Nutrition Therapy  Appt Start Time: 3:15pm  End Time: 4:00pm  Primary concerns today: weight management for upcoming epigastric hernia repair surgery   Referral diagnosis: E66.01 morbid (severe) obesity d/t excess calories  Preferred learning style: no preference indicated Learning readiness: ready   NUTRITION ASSESSMENT  Clinical Medical Hx: obesity, asthma, thrush, chronic interstitial cystitis, umbilical hernia, colonic polyps, major depressive disorder  Surgeries: umbilical hernia repair  Medications: see list   Psychosocial/Lifestyle Currently works from home. Cares for husband, who is disabled and health is declining. Their son lives with them to help care for her husband.   24-Hr Dietary Recall First Meal: pudding cup (or Honey Nut Cheerios + blueberries + skim milk) (or breakfast Hot Pocket)  Snack: popcorn (or cheese)  Second Meal: [place] [employer] (ham + provolone + lettuce + tomato + onions + pickles + white sub)  Snack: usually none  Third Meal: fried fish + green beans + potatoes Snack: usually none  Beverages: diet coke, water  Food & Nutrition Related Hx Dietary Hx: Pt states she does not particularly avoid many foods, but "does not eat enough of what she should, and eats too much of what she should not." Usually eats white bread or flatbread for sandwiches. Does not eat vegetables often. States she often has to make a meal for herself and another for her husband due to different food preferences. States it is difficult to plan out healthy meals since she works full time and also cares for her husband during the day. Previous diets/weight loss methods include Weight Watchers and the [employer] diet plan, uses smoothie recipe from this still. Mentioned combining plant proteins to create complete protein profile (such as wheat bread with peanut butter.)   Estimated Daily Fluid Intake: 64 oz Supplements: zinc, vitamin C  GI / Other Notable Symptoms: none that are  typical  Sleep: 11pm - 6am, wakes up every 2 hours to use the restroom   Stress / Self-Care: stressed (an 11 on a scale from 1 to 10); husband is disabled, so son lives with them to help care for him; for self-care, likes to get outside and walk, read, and play games on her phone   Physical Activity  Current average weekly physical activity: walking occasionally   Estimated Energy Needs Calories: 1600 Carbohydrate: 180g Protein: 100g Fat: 53g    NUTRITION DIAGNOSIS  Excessive oral intake (NI-2.2) related to undesirable food choices as evidenced by pt reported dietary hx of intakes of foods (amounts and types) which exceed estimated energy needs, referral for obesity, and request for dietary/nutrition counseling for weight loss.    NUTRITION INTERVENTION  Nutrition education (E-1) on the following topics:  . Healthful balanced diet: MyPlate, food groups, variety, focus on plant foods for fiber  . Intuitive eating: mindfulness, eating when hungry, stopping when full, use snacks as needed, portion control   Handouts Provided Include   MyPlate   Meal Ideas   Balanced Snacks   Learning Style & Readiness for Change Teaching method utilized: Visual & Auditory  Demonstrated degree of understanding via: Teach Back  Barriers to learning/adherence to lifestyle change: None Identified   Goals Established by Pt . Aim to drink at least 64 ounces of water per day.  . Focus on building balanced lunches and dinners using the MyPlate model and Meal Ideas sheet.  . Aim to walk 30 minutes per day on every day of the week.     MONITORING & EVALUATION Dietary intake, weekly physical  activity, and goals in 2 months.  RD's Notes for Next Visit  . Balanced breakfasts  . Assess protein intake  . Consider tracking food/beverage intake   Next Steps  Patient is to return to [facility] for follow up visit in 2 months. Patient may contact via email or phone as needed.

## 2019-06-10 NOTE — Patient Instructions (Addendum)
-   Aim to drink at least 64 ounces of water per day.  - Focus on building balanced lunches and dinners using the MyPlate model and Meal Ideas sheet.  - Aim to walk 30 minutes per day on every day of the week.

## 2019-06-11 ENCOUNTER — Other Ambulatory Visit: Payer: Self-pay | Admitting: Family Medicine

## 2019-06-11 ENCOUNTER — Other Ambulatory Visit: Payer: Self-pay

## 2019-06-11 MED ORDER — BUPROPION HCL ER (XL) 150 MG PO TB24: 150.0000 mg | ORAL_TABLET | Freq: Every day | ORAL | 0 refills | Status: DC

## 2019-06-11 MED FILL — buPROPion HCL ER (XL) 150 M: 150 | 30 days supply | Qty: 30 | Fill #0

## 2019-06-15 ENCOUNTER — Other Ambulatory Visit: Payer: Self-pay | Admitting: Family Medicine

## 2019-06-22 ENCOUNTER — Encounter (HOSPITAL_COMMUNITY): Payer: Self-pay

## 2019-06-22 MED FILL — ALBUTEROL SULFATE HFA 108 (: 108 (90 BAS | 25 days supply | Qty: 18 | Fill #1

## 2019-06-22 NOTE — Patient Instructions (Addendum)
DUE TO COVID-19 ONLY ONE VISITOR IS ALLOWED TO VISIT DURING VISITOR HOURS ONLY!!   COVID SWAB TESTING MUST BE COMPLETED ON:  Today immediately following pre op appointment.   [address], [place] -[facility] enter pre surgical testing line (Must self quarantine after testing. Follow instructions on handout.)             Your procedure is scheduled on: Friday, Aug. 14, 2020   Report to [facility] Main  Entrance   Report to Short Stay at 5:30 AM   Call this number if you have problems the morning of surgery [phone]   Do not eat food or drink liquids :After Midnight.   Brush your teeth the morning of surgery.   Do NOT smoke after Midnight   Take these medicines the morning of surgery with A SIP OF WATER: Bupropion, Pepcid, Fluoxetine   Use Inhaler per normal routine   May use nasal spray if needed day of surgery   Bring rescue inhaler day of surgery                               You may not have any metal on your body including hair pins, jewelry, and body piercings             Do not wear make-up, lotions, powders, perfumes/cologne, or deodorant             Do not wear nail polish.  Do not shave  48 hours prior to surgery.                Do not bring valuables to the hospital. [facility].   Contacts, dentures or bridgework may not be worn into surgery.   Bring small overnight bag day of surgery.    Special Instructions: Bring a copy of your healthcare power of attorney and living will documents         the day of surgery if you haven't scanned them in before.              Please read over the following fact sheets you were given:  [facility] - Preparing for Surgery Before surgery, you can play an important role.  Because skin is not sterile, your skin needs to be as free of germs as possible.  You can reduce the number of germs on your skin by washing with CHG (chlorahexidine  gluconate) soap before surgery.  CHG is an antiseptic cleaner which kills germs and bonds with the skin to continue killing germs even after washing. Please DO NOT use if you have an allergy to CHG or antibacterial soaps.  If your skin becomes reddened/irritated stop using the CHG and inform your nurse when you arrive at Short Stay. Do not shave (including legs and underarms) for at least 48 hours prior to the first CHG shower.  You may shave your face/neck.  Please follow these instructions carefully:  1.  Shower with CHG Soap the night before surgery and the  morning of surgery.  2.  If you choose to wash your hair, wash your hair first as usual with your normal  shampoo.  3.  After you shampoo, rinse your hair and body thoroughly to remove the shampoo.  4.  Use CHG as you would any other liquid soap.  You can apply chg directly to the skin and wash.  Gently with a scrungie or clean washcloth.  5.  Apply the CHG Soap to your body ONLY FROM THE NECK DOWN.   Do   not use on face/ open                           Wound or open sores. Avoid contact with eyes, ears mouth and   genitals (private parts).                       Wash face,  Genitals (private parts) with your normal soap.             6.  Wash thoroughly, paying special attention to the area where your    surgery  will be performed.  7.  Thoroughly rinse your body with warm water from the neck down.  8.  DO NOT shower/wash with your normal soap after using and rinsing off the CHG Soap.                9.  Pat yourself dry with a clean towel.            10.  Wear clean pajamas.            11.  Place clean sheets on your bed the night of your first shower and do not  sleep with pets. Day of Surgery : Do not apply any lotions/deodorants the morning of surgery.  Please wear clean clothes to the hospital/surgery center.  FAILURE TO FOLLOW THESE INSTRUCTIONS MAY RESULT IN THE CANCELLATION OF YOUR SURGERY  PATIENT  SIGNATURE_________________________________  NURSE SIGNATURE__________________________________  ________________________________________________________________________

## 2019-06-23 ENCOUNTER — Other Ambulatory Visit (HOSPITAL_COMMUNITY)
Admission: RE | Admit: 2019-06-23 | Discharge: 2019-06-23 | Disposition: A | Discharge status: Home / Self Care | Payer: 59 | Source: Ambulatory Visit | Attending: Surgery | Admitting: Surgery

## 2019-06-23 ENCOUNTER — Other Ambulatory Visit: Payer: Self-pay

## 2019-06-23 ENCOUNTER — Encounter (HOSPITAL_COMMUNITY)
Admission: RE | Admit: 2019-06-23 | Discharge: 2019-06-23 | Disposition: A | Discharge status: Home / Self Care | Payer: 59 | Source: Ambulatory Visit | Attending: Surgery | Admitting: Surgery

## 2019-06-23 ENCOUNTER — Other Ambulatory Visit (HOSPITAL_COMMUNITY): Payer: 59

## 2019-06-23 ENCOUNTER — Encounter (HOSPITAL_COMMUNITY): Payer: Self-pay

## 2019-06-23 DIAGNOSIS — Z20828 Contact with and (suspected) exposure to other viral communicable diseases: Secondary | ICD-10-CM | POA: Insufficient documentation

## 2019-06-23 DIAGNOSIS — K439 Ventral hernia without obstruction or gangrene: Secondary | ICD-10-CM | POA: Diagnosis not present

## 2019-06-23 DIAGNOSIS — Z01812 Encounter for preprocedural laboratory examination: Secondary | ICD-10-CM | POA: Insufficient documentation

## 2019-06-23 HISTORY — DX: Somnolence: R40.0

## 2019-06-23 HISTORY — DX: Allergic rhinitis, unspecified: J30.9

## 2019-06-23 HISTORY — DX: Ventral hernia without obstruction or gangrene: K43.9

## 2019-06-23 LAB — SARS CORONAVIRUS 2 (TAT 6-24 HRS): SARS Coronavirus 2: NEGATIVE

## 2019-06-23 LAB — CBC
HCT: 42.7 % (ref 36.0–46.0)
Hemoglobin: 13 g/dL (ref 12.0–15.0)
MCH: 28.3 pg (ref 26.0–34.0)
MCHC: 30.4 g/dL (ref 30.0–36.0)
MCV: 93 fL (ref 80.0–100.0)
Platelets: 249 10*3/uL (ref 150–400)
RBC: 4.59 MIL/uL (ref 3.87–5.11)
RDW: 13.2 % (ref 11.5–15.5)
WBC: 6.6 10*3/uL (ref 4.0–10.5)
nRBC: 0 % (ref 0.0–0.2)

## 2019-06-24 ENCOUNTER — Encounter: Payer: 59 | Admitting: Family Medicine

## 2019-06-25 MED ORDER — BUPIVACAINE LIPOSOME 1.3 % IJ SUSP
20.0000 mL | Freq: Once | INTRAMUSCULAR | Status: DC
  Filled 2019-06-25: qty 20

## 2019-06-25 NOTE — H&P (Signed)
Donna Willis  Location: [facility] Surgery Patient #: [id number] DOB: Oct 26, 1961 Married / Language: English / Race: White Female  History of Present Illness   The patient is a 58 year old female who presents with a complaint of hernia.  The PCP is Dr. Elijah Birk  The patient was referred by Dr. Roxine Caddy.  She comes by herself.  [The Covid-19 virus has disrupted normal medical care in [place] and across the nation. We have sometimes had to alter normal surgical/medical care to limit this epidemic and we have explained these changes to the patient.]  I took care of Donna Willis in 2013 for an urgent laparoscopic umbilical hernia repair. She underwent a laparoscopic umbilical/Ventral hernia repair on 08/20/2012 by Dr. Keturah Barre. Donna Willis. I have not seen Donna Willis since 2013. Over the last 2-3 years she has noticed an increasing bulge in her upper abdomen. Apparently this last Sunday, June 14, she had enough pain that she went to the emergency room. Abdominal CT scan on 04/26/2019 shows . 1. Abnormal stranding in the adipose tissue anterior to the urinarybladder, suspicious for possible cystitis. 2. Right eccentric supraumbilical hernia containing about 250 cubic cm of omental adipose tissue. Possible smaller hernia in the umbilical region. Hernia mesh noted. No herniated bowel. 3. Lumbar spondylosis and degenerative disc disease. She was given some pain medicines and has done well since Sunday with her abdominal pain.  I discussed the indications and complications of hernia surgery with the patient. I discussed both the laparoscopic and open approach to hernia repair.. The potential risks of hernia surgery include, but are not limited to, bleeding, infection, open surgery, nerve injury, and recurrence of the hernia. I provided the patient literature about hernia surgery. We talked about the use of mesh in hernias and their risks.  The risk of mesh include chronic infection, erosion to other structures, and chronic pain. I also talked about this possibly being a combined open/laparoscopic repair. I also spent a fair amount of time with her discussing her weight. I discussed that with her BMI above 40, the risk of the hernia failing is much higher. We talked about trying to lose weight by diet first. I can make a referral to nutrition for her. We also talked superficially about weight loss surgery.  Plan: 1) laparoscopic/open epigastric hernia repair, 2) nutrition consult for weight loss  Review of Systems as stated in this history (HPI) or in the review of systems. Otherwise all other 12 point ROS are negative  Past Medical History: 1. Laparoscopic umbilical/Ventral hernia repair on 08/20/2012 by Dr. Keturah Barre. Donna Willis.  This was an urgent operation. 2. Lap cholecystectomy - 2002 3. Degenerative disk disease 4. History of interstitial cystitis - She saw Dr. Leander Willis in 2019 for this. It seems like it is doing okay. 5. GERD 6. Last colonoscopy - 2017 - Donna Willis 7. She has some chronic back pain, but sees no one for this. 8. Morbid obesity BMI - 42  Social History: Married, husband Donna Willis, has a disability She has one son Donna Willis. He is the one who takes care of her. 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 multiple  Diagnostic Studies History Donna Willis, Willis; 04/30/2019 11:41 AM) Colonoscopy  1-5 years ago Mammogram  1-3  years ago Pap Smear  1-5 years ago  Allergies Donna Willis, Willis; 04/30/2019 11:43 AM) Cipro *FLUOROQUINOLONES*  Allergies Reconciled   Medication History Donna Willis, Willis; 04/30/2019 11:44 AM) Qvar RediHaler  (80MCG/ACT Aero Breath Act, Inhalation) Active. Ondansetron (4MG  Tablet Disint, Oral) Active. FLUoxetine HCl (20MG  Capsule, Oral) Active. buPROPion HCl ER (XL) (150MG  Tablet ER 24HR, Oral) Active. Doxycycline Hyclate (100MG  Tablet, Oral) Active. Medications Reconciled  Social History Donna Willis, Willis; 04/30/2019 11:41 AM) Alcohol use  Occasional alcohol use. Caffeine use  Carbonated beverages. No drug use  Tobacco use  Never smoker.  Family History Donna Willis, [place]; 04/30/2019 11:41 AM) Arthritis  Father, Mother. Cerebrovascular Accident  Father. Colon Polyps  Mother. Depression  Father, Mother. Hypertension  Father. Kidney Disease  Father. Thyroid problems  Father.  Pregnancy / Birth History Donna Willis, [place]; 04/30/2019 11:41 AM) Age at menarche  58 years. Gravida  1 Length (months) of breastfeeding  >73 Maternal age  87-25 Para  1 Regular periods   Other Problems Donna Willis, Willis; 04/30/2019 11:41 AM) Asthma  Back Pain  Bladder Problems  Cholelithiasis  Depression  Gastroesophageal Reflux Disease  Umbilical Hernia Repair     Review of Systems (Donna Willis; 04/30/2019 11:41 AM) General Not Present- Appetite Loss, Chills, Fatigue, Fever, Night Sweats, Weight Gain and Weight Loss. Skin Present- Dryness. Not Present- Change in Wart/Mole, Hives, Jaundice, New Lesions, Non-Healing Wounds, Rash and Ulcer. HEENT Present- Wears glasses/contact lenses. Not Present- Earache, Hearing Loss, Hoarseness, Nose Bleed, Oral Ulcers, Ringing in the Ears, Seasonal Allergies, Sinus Pain, Sore Throat, Visual Disturbances and Yellow Eyes. Respiratory Not Present- Bloody sputum, Chronic Cough, Difficulty Breathing, Snoring and Wheezing. Gastrointestinal Present- Bloating. Not Present- Abdominal Pain, Bloody Stool, Change in Bowel Habits, Chronic diarrhea, Constipation, Difficulty Swallowing, Excessive gas, Gets full quickly at meals, Hemorrhoids,  Indigestion, Nausea, Rectal Pain and Vomiting. Female Genitourinary Present- Frequency and Nocturia. Not Present- Painful Urination, Pelvic Pain and Urgency. Musculoskeletal Present- Back Pain. Not Present- Joint Pain, Joint Stiffness, Muscle Pain, Muscle Weakness and Swelling of Extremities. Neurological Not Present- Decreased Memory, Fainting, Headaches, Numbness, Seizures, Tingling, Tremor, Trouble walking and Weakness. Psychiatric Present- Depression. Not Present- Anxiety, Bipolar, Change in Sleep Pattern, Fearful and Frequent crying.  Vitals (Donna Willis; 04/30/2019 11:44 AM) 04/30/2019 11:44 AM Weight: 260.2 lb Height: 65in Body Surface Area: 2.21 m Body Mass Index: 43.3 kg/m  Temp.: 45F (Temporal)  Pulse: 97 (Regular)  BP: 118/62(Sitting, Left Arm, Standard)  Physical Exam  General: obese WF who os alert and generally healthy appearing. She is wearing a mask. Skin: Inspection and palpation of the skin unremarkable.  Eyes: Conjunctivae white, pupils equal. Face, ears, nose, mouth, and throat: Face - normal. Normal ears and nose. Lips and teeth normal.  Neck: Supple. No mass. Trachea midline. No thyroid mass. Lymph Nodes: No supraclavicular or cervical adenopathy. No axillary adenopathy.  Lungs: Normal respiratory effort. Clear to auscultation and symmetric breath sounds. Cardiovascular: Regular rate and rhythm. Normal auscultation of the heart. No murmur or rub.  Abdomen: Soft. No mass. Liver and spleen not palpable. Normal bowel sounds.  She has a 12 cm bulge in the right upper abdomen that is consistent with her hernia. I actually think that the umbilical hernia repair is doing well. The hernia partially reduces when she lays down. Rectal: Not done.  Musculoskeletal/extremities: Normal gait. Good strength and ROM in upper and lower extremities.  Neurologic: Grossly intact to motor and sensory function. Psychiatric: Has normal mood and affect. Judgement  and insight appear normal.   Assessment & Plan  1.  EPIGASTRIC HERNIA (K43.9)  Plan:  1) Laparoscopic/open epigastric hernia repair  2.  MORBID OBESITY (E66.01) BMI - 42  Plan:  1) Nutrition consult for weight loss  3. Laparoscopic umbilical/Ventral hernia repair on 08/20/2012 by Dr. Keturah Barre. Sigmund Morera.  This was an urgent operation. 4. Degenerative disk disease 5. History of interstitial cystitis -  She saw Dr. Leander Willis in 2019 for this. It seems like it is doing okay. 6. GERD 7. She has some chronic back pain, but sees no one for this.  Alphonsa Overall, MD, [facility] Pager: [phone] Office phone:  [phone]

## 2019-06-25 NOTE — Anesthesia Preprocedure Evaluation (Addendum)
Anesthesia Evaluation  Patient identified by MRN, date of birth, ID band Patient awake    Reviewed: Allergy & Precautions, NPO status , Patient's Chart, lab work & pertinent test results  History of Anesthesia Complications (+) PONV  Airway Mallampati: II  TM Distance: >3 FB     Dental   Pulmonary    breath sounds clear to auscultation       Cardiovascular negative cardio ROS   Rhythm:Regular Rate:Normal     Neuro/Psych    GI/Hepatic Neg liver ROS, GERD  ,  Endo/Other    Renal/GU negative Renal ROS     Musculoskeletal   Abdominal   Peds  Hematology   Anesthesia Other Findings   Reproductive/Obstetrics                             Anesthesia Physical Anesthesia Plan  ASA: III  Anesthesia Plan: General   Post-op Pain Management:  Regional for Post-op pain   Induction: Intravenous  PONV Risk Score and Plan: 4 or greater and Ondansetron, Dexamethasone and Midazolam  Airway Management Planned: Oral ETT  Additional Equipment:   Intra-op Plan:   Post-operative Plan: Extubation in OR  Informed Consent: I have reviewed the patients History and Physical, chart, labs and discussed the procedure including the risks, benefits and alternatives for the proposed anesthesia with the patient or authorized representative who has indicated his/her understanding and acceptance.     Dental advisory given  Plan Discussed with: CRNA and Anesthesiologist  Anesthesia Plan Comments:        Anesthesia Quick Evaluation

## 2019-06-26 ENCOUNTER — Other Ambulatory Visit: Payer: Self-pay

## 2019-06-26 ENCOUNTER — Encounter (HOSPITAL_COMMUNITY)
Admission: RE | Disposition: A | Discharge status: Home / Self Care | Payer: Self-pay | Source: Home / Self Care | Attending: Surgery

## 2019-06-26 ENCOUNTER — Observation Stay (HOSPITAL_COMMUNITY)
Admission: RE | Admit: 2019-06-26 | Discharge: 2019-06-27 | Disposition: A | Discharge status: Home / Self Care | Payer: 59 | Attending: Surgery | Admitting: Surgery

## 2019-06-26 ENCOUNTER — Ambulatory Visit (HOSPITAL_COMMUNITY): Payer: 59 | Admitting: Physician Assistant

## 2019-06-26 ENCOUNTER — Ambulatory Visit (HOSPITAL_COMMUNITY): Payer: 59 | Admitting: Anesthesiology

## 2019-06-26 ENCOUNTER — Encounter (HOSPITAL_COMMUNITY): Payer: Self-pay | Admitting: *Deleted

## 2019-06-26 DIAGNOSIS — Z79899 Other long term (current) drug therapy: Secondary | ICD-10-CM | POA: Insufficient documentation

## 2019-06-26 DIAGNOSIS — F329 Major depressive disorder, single episode, unspecified: Secondary | ICD-10-CM | POA: Diagnosis not present

## 2019-06-26 DIAGNOSIS — K219 Gastro-esophageal reflux disease without esophagitis: Secondary | ICD-10-CM | POA: Diagnosis not present

## 2019-06-26 DIAGNOSIS — J4520 Mild intermittent asthma, uncomplicated: Secondary | ICD-10-CM | POA: Diagnosis not present

## 2019-06-26 DIAGNOSIS — Z881 Allergy status to other antibiotic agents status: Secondary | ICD-10-CM | POA: Diagnosis not present

## 2019-06-26 DIAGNOSIS — J45909 Unspecified asthma, uncomplicated: Secondary | ICD-10-CM | POA: Diagnosis not present

## 2019-06-26 DIAGNOSIS — Z7951 Long term (current) use of inhaled steroids: Secondary | ICD-10-CM | POA: Insufficient documentation

## 2019-06-26 DIAGNOSIS — Z6841 Body Mass Index (BMI) 40.0 and over, adult: Secondary | ICD-10-CM | POA: Diagnosis not present

## 2019-06-26 DIAGNOSIS — K436 Other and unspecified ventral hernia with obstruction, without gangrene: Secondary | ICD-10-CM | POA: Diagnosis not present

## 2019-06-26 DIAGNOSIS — K439 Ventral hernia without obstruction or gangrene: Secondary | ICD-10-CM | POA: Diagnosis not present

## 2019-06-26 HISTORY — PX: EPIGASTRIC HERNIA REPAIR: SHX404

## 2019-06-26 LAB — PREGNANCY, URINE: Preg Test, Ur: NEGATIVE

## 2019-06-26 SURGERY — REPAIR, HERNIA, EPIGASTRIC, ADULT
Anesthesia: General | Site: Abdomen

## 2019-06-26 MED ORDER — ALBUTEROL SULFATE (2.5 MG/3ML) 0.083% IN NEBU: 2.5000 mg | INHALATION_SOLUTION | Freq: Four times a day (QID) | RESPIRATORY_TRACT | Status: DC | PRN

## 2019-06-26 MED ORDER — 0.9 % SODIUM CHLORIDE (POUR BTL) OPTIME
TOPICAL | Status: DC | PRN
  Administered 2019-06-26: 1000 mL

## 2019-06-26 MED ORDER — PROPOFOL 10 MG/ML IV BOLUS
INTRAVENOUS | Status: AC
  Filled 2019-06-26: qty 20

## 2019-06-26 MED ORDER — HYDROMORPHONE HCL 1 MG/ML IJ SOLN
0.2500 mg | INTRAMUSCULAR | Status: DC | PRN
  Administered 2019-06-26: 0.5 mg via INTRAVENOUS

## 2019-06-26 MED ORDER — FENTANYL CITRATE (PF) 100 MCG/2ML IJ SOLN
INTRAMUSCULAR | Status: DC | PRN
  Administered 2019-06-26 (×2): 50 ug via INTRAVENOUS
  Administered 2019-06-26: 100 ug via INTRAVENOUS
  Administered 2019-06-26: 50 ug via INTRAVENOUS

## 2019-06-26 MED ORDER — MIDAZOLAM HCL 2 MG/2ML IJ SOLN
INTRAMUSCULAR | Status: AC
  Filled 2019-06-26: qty 2

## 2019-06-26 MED ORDER — KETAMINE HCL 10 MG/ML IJ SOLN
INTRAMUSCULAR | Status: DC | PRN
  Administered 2019-06-26: 30 mg via INTRAVENOUS

## 2019-06-26 MED ORDER — BUPROPION HCL ER (XL) 150 MG PO TB24
150.0000 mg | ORAL_TABLET | Freq: Every day | ORAL | Status: DC
  Administered 2019-06-27: 150 mg via ORAL
  Filled 2019-06-26: qty 1

## 2019-06-26 MED ORDER — ONDANSETRON HCL 4 MG/2ML IJ SOLN: 4.0000 mg | Freq: Four times a day (QID) | INTRAMUSCULAR | Status: DC | PRN

## 2019-06-26 MED ORDER — CEFAZOLIN SODIUM-DEXTROSE 2-4 GM/100ML-% IV SOLN
2.0000 g | INTRAVENOUS | Status: AC
  Administered 2019-06-26: 2 g via INTRAVENOUS
  Filled 2019-06-26: qty 100

## 2019-06-26 MED ORDER — BUPIVACAINE-EPINEPHRINE (PF) 0.25% -1:200000 IJ SOLN
INTRAMUSCULAR | Status: AC
  Filled 2019-06-26: qty 30

## 2019-06-26 MED ORDER — ROCURONIUM BROMIDE 10 MG/ML (PF) SYRINGE
PREFILLED_SYRINGE | INTRAVENOUS | Status: AC
  Filled 2019-06-26: qty 10

## 2019-06-26 MED ORDER — DEXAMETHASONE SODIUM PHOSPHATE 10 MG/ML IJ SOLN
INTRAMUSCULAR | Status: DC | PRN
  Administered 2019-06-26: 10 mg via INTRAVENOUS

## 2019-06-26 MED ORDER — BUPIVACAINE-EPINEPHRINE (PF) 0.25% -1:200000 IJ SOLN
INTRAMUSCULAR | Status: DC | PRN
  Administered 2019-06-26: 20 mL
  Administered 2019-06-26: 10 mL

## 2019-06-26 MED ORDER — TRIAMCINOLONE ACETONIDE 55 MCG/ACT NA AERO: 1.0000 | INHALATION_SPRAY | Freq: Every day | NASAL | Status: DC | PRN

## 2019-06-26 MED ORDER — KCL IN DEXTROSE-NACL 20-5-0.45 MEQ/L-%-% IV SOLN
INTRAVENOUS | Status: DC
  Administered 2019-06-26: 15:00:00 via INTRAVENOUS
  Filled 2019-06-26: qty 1000

## 2019-06-26 MED ORDER — HYDROMORPHONE HCL 1 MG/ML IJ SOLN
INTRAMUSCULAR | Status: AC
  Filled 2019-06-26: qty 1

## 2019-06-26 MED ORDER — ENOXAPARIN SODIUM 40 MG/0.4ML ~~LOC~~ SOLN
40.0000 mg | SUBCUTANEOUS | Status: DC
  Administered 2019-06-27: 40 mg via SUBCUTANEOUS
  Filled 2019-06-26: qty 0.4

## 2019-06-26 MED ORDER — LIDOCAINE 2% (20 MG/ML) 5 ML SYRINGE
INTRAMUSCULAR | Status: AC
  Filled 2019-06-26: qty 10

## 2019-06-26 MED ORDER — LIDOCAINE 2% (20 MG/ML) 5 ML SYRINGE
INTRAMUSCULAR | Status: DC | PRN
  Administered 2019-06-26: 1.5 mg/kg/h via INTRAVENOUS

## 2019-06-26 MED ORDER — ONDANSETRON HCL 4 MG/2ML IJ SOLN
INTRAMUSCULAR | Status: DC | PRN
  Administered 2019-06-26: 4 mg via INTRAVENOUS

## 2019-06-26 MED ORDER — CHLORHEXIDINE GLUCONATE CLOTH 2 % EX PADS: 6.0000 | MEDICATED_PAD | Freq: Once | CUTANEOUS | Status: DC

## 2019-06-26 MED ORDER — ROCURONIUM BROMIDE 10 MG/ML (PF) SYRINGE
PREFILLED_SYRINGE | INTRAVENOUS | Status: DC | PRN
  Administered 2019-06-26: 50 mg via INTRAVENOUS

## 2019-06-26 MED ORDER — HYDROMORPHONE HCL 1 MG/ML IJ SOLN
0.2500 mg | INTRAMUSCULAR | Status: DC | PRN
  Administered 2019-06-26 (×2): 0.5 mg via INTRAVENOUS

## 2019-06-26 MED ORDER — HYDROCODONE-ACETAMINOPHEN 5-325 MG PO TABS
1.0000 | ORAL_TABLET | ORAL | Status: DC | PRN
  Administered 2019-06-26 – 2019-06-27 (×2): 2 via ORAL
  Filled 2019-06-26 (×2): qty 2

## 2019-06-26 MED ORDER — TRAMADOL HCL 50 MG PO TABS
50.0000 mg | ORAL_TABLET | Freq: Four times a day (QID) | ORAL | Status: DC | PRN
  Administered 2019-06-26 (×2): 50 mg via ORAL
  Filled 2019-06-26 (×2): qty 1

## 2019-06-26 MED ORDER — ONDANSETRON 4 MG PO TBDP: 4.0000 mg | ORAL_TABLET | Freq: Four times a day (QID) | ORAL | Status: DC | PRN

## 2019-06-26 MED ORDER — LACTATED RINGERS IV SOLN
INTRAVENOUS | Status: DC
  Administered 2019-06-26 (×2): via INTRAVENOUS

## 2019-06-26 MED ORDER — MIDAZOLAM HCL 5 MG/5ML IJ SOLN
INTRAMUSCULAR | Status: DC | PRN
  Administered 2019-06-26: 2 mg via INTRAVENOUS

## 2019-06-26 MED ORDER — BUPIVACAINE LIPOSOME 1.3 % IJ SUSP
INTRAMUSCULAR | Status: DC | PRN
  Administered 2019-06-26 (×2): 10 mL

## 2019-06-26 MED ORDER — EPHEDRINE SULFATE-NACL 50-0.9 MG/10ML-% IV SOSY
PREFILLED_SYRINGE | INTRAVENOUS | Status: DC | PRN
  Administered 2019-06-26 (×2): 10 mg via INTRAVENOUS

## 2019-06-26 MED ORDER — MORPHINE SULFATE (PF) 4 MG/ML IV SOLN: 1.0000 mg | INTRAVENOUS | Status: DC | PRN

## 2019-06-26 MED ORDER — FENTANYL CITRATE (PF) 250 MCG/5ML IJ SOLN
INTRAMUSCULAR | Status: AC
  Filled 2019-06-26: qty 5

## 2019-06-26 MED ORDER — KETAMINE HCL 10 MG/ML IJ SOLN
INTRAMUSCULAR | Status: AC
  Filled 2019-06-26: qty 1

## 2019-06-26 MED ORDER — SUGAMMADEX SODIUM 200 MG/2ML IV SOLN
INTRAVENOUS | Status: DC | PRN
  Administered 2019-06-26: 200 mg via INTRAVENOUS

## 2019-06-26 MED ORDER — BUPIVACAINE HCL (PF) 0.25 % IJ SOLN
INTRAMUSCULAR | Status: AC
  Filled 2019-06-26: qty 30

## 2019-06-26 MED ORDER — PROPOFOL 10 MG/ML IV BOLUS
INTRAVENOUS | Status: DC | PRN
  Administered 2019-06-26: 180 mg via INTRAVENOUS

## 2019-06-26 MED ORDER — DEXAMETHASONE SODIUM PHOSPHATE 10 MG/ML IJ SOLN
INTRAMUSCULAR | Status: AC
  Filled 2019-06-26: qty 1

## 2019-06-26 MED ORDER — LIDOCAINE 2% (20 MG/ML) 5 ML SYRINGE
INTRAMUSCULAR | Status: AC
  Filled 2019-06-26: qty 5

## 2019-06-26 MED ORDER — FLUOXETINE HCL 20 MG PO CAPS
20.0000 mg | ORAL_CAPSULE | Freq: Every day | ORAL | Status: DC
  Administered 2019-06-27: 20 mg via ORAL
  Filled 2019-06-26 (×2): qty 1

## 2019-06-26 MED ORDER — ONDANSETRON HCL 4 MG/2ML IJ SOLN
INTRAMUSCULAR | Status: AC
  Filled 2019-06-26: qty 2

## 2019-06-26 MED ORDER — LIDOCAINE 2% (20 MG/ML) 5 ML SYRINGE
INTRAMUSCULAR | Status: DC | PRN
  Administered 2019-06-26: 100 mg via INTRAVENOUS

## 2019-06-26 MED ORDER — ACETAMINOPHEN 500 MG PO TABS
1000.0000 mg | ORAL_TABLET | ORAL | Status: AC
  Administered 2019-06-26: 1000 mg via ORAL
  Filled 2019-06-26: qty 2

## 2019-06-26 MED ORDER — GABAPENTIN 300 MG PO CAPS
300.0000 mg | ORAL_CAPSULE | ORAL | Status: AC
  Administered 2019-06-26: 300 mg via ORAL
  Filled 2019-06-26: qty 1

## 2019-06-26 SURGICAL SUPPLY — 36 items
APL PRP STRL LF DISP 70% ISPRP (MISCELLANEOUS) ×1
BINDER ABDOMINAL 12 ML 46-62 (SOFTGOODS) ×2 IMPLANT
CHLORAPREP W/TINT 26 (MISCELLANEOUS) ×2 IMPLANT
COVER SURGICAL LIGHT HANDLE (MISCELLANEOUS) ×2 IMPLANT
DECANTER SPIKE VIAL GLASS SM (MISCELLANEOUS) ×2 IMPLANT
DERMABOND ADVANCED (GAUZE/BANDAGES/DRESSINGS) ×1
DERMABOND ADVANCED .7 DNX12 (GAUZE/BANDAGES/DRESSINGS) IMPLANT
DEVICE SECURE STRAP 25 ABSORB (INSTRUMENTS) ×2 IMPLANT
DEVICE TROCAR PUNCTURE CLOSURE (ENDOMECHANICALS) ×1 IMPLANT
DRAPE INCISE IOBAN 66X45 STRL (DRAPES) ×2 IMPLANT
DRSG PAD ABDOMINAL 8X10 ST (GAUZE/BANDAGES/DRESSINGS) ×1 IMPLANT
ELECT PENCIL ROCKER SW 15FT (MISCELLANEOUS) ×1 IMPLANT
ELECT REM PT RETURN 15FT ADLT (MISCELLANEOUS) ×2 IMPLANT
GAUZE SPONGE 4X4 12PLY STRL (GAUZE/BANDAGES/DRESSINGS) ×1 IMPLANT
GLOVE SURG SYN 7.5  E (GLOVE) ×1
GLOVE SURG SYN 7.5 E (GLOVE) ×1 IMPLANT
GLOVE SURG SYN 7.5 PF PI (GLOVE) ×1 IMPLANT
GOWN STRL REUS W/TWL XL LVL3 (GOWN DISPOSABLE) ×5 IMPLANT
KIT BASIN OR (CUSTOM PROCEDURE TRAY) ×2 IMPLANT
KIT TURNOVER KIT A (KITS) IMPLANT
MARKER SKIN DUAL TIP RULER LAB (MISCELLANEOUS) ×2 IMPLANT
MESH PARIETEX 20X15 (Mesh General) ×1 IMPLANT
NDL SPNL 22GX3.5 QUINCKE BK (NEEDLE) ×1 IMPLANT
NEEDLE SPNL 22GX3.5 QUINCKE BK (NEEDLE) ×2 IMPLANT
PROTECTOR NERVE ULNAR (MISCELLANEOUS) ×1 IMPLANT
SCISSORS LAP 5X35 DISP (ENDOMECHANICALS) ×2 IMPLANT
SET IRRIG TUBING LAPAROSCOPIC (IRRIGATION / IRRIGATOR) ×1 IMPLANT
SET TUBE SMOKE EVAC HIGH FLOW (TUBING) ×1 IMPLANT
SLEEVE ADV FIXATION 5X100MM (TROCAR) ×1 IMPLANT
SUT MNCRL AB 4-0 PS2 18 (SUTURE) ×2 IMPLANT
SUT NOVA 0 T19/GS 22DT (SUTURE) ×4 IMPLANT
SUT VIC AB 3-0 SH 8-18 (SUTURE) ×1 IMPLANT
TOWEL OR 17X26 10 PK STRL BLUE (TOWEL DISPOSABLE) ×2 IMPLANT
TRAY LAPAROSCOPIC (CUSTOM PROCEDURE TRAY) ×2 IMPLANT
TROCAR ADV FIXATION 5X100MM (TROCAR) ×1 IMPLANT
TROCAR BLADELESS OPT 5 100 (ENDOMECHANICALS) ×1 IMPLANT

## 2019-06-26 NOTE — Anesthesia Postprocedure Evaluation (Signed)
Anesthesia Post Note  Patient: Donna Willis  Procedure(s) Performed: LAPAROSCOPIC EPIGASTRIC HERNIA REPAIR WITH MESH (N/A Abdomen)     Patient location during evaluation: PACU Anesthesia Type: General Level of consciousness: awake Pain management: pain level controlled Respiratory status: spontaneous breathing Cardiovascular status: blood pressure returned to baseline Postop Assessment: no apparent nausea or vomiting Anesthetic complications: no    Last Vitals:  Vitals:   06/26/19 1030 06/26/19 1045  BP: 133/71 129/70  Pulse: 79 78  Resp: 16 17  Temp: 37 C   SpO2: 96% 99%    Last Pain:  Vitals:   06/26/19 1045  TempSrc:   PainSc: 5                  Ashritha Desrosiers

## 2019-06-26 NOTE — Progress Notes (Signed)
Patient's IV infiltrated. Patient requested not to have another IV put in. Writer paged Dr. Lucia Gaskins. Dr. Lucia Gaskins said okayed if patient is drinking well

## 2019-06-26 NOTE — Op Note (Signed)
OPERATIVE NOTE  06/26/2019  9:53 AM  PATIENT:  Donna Willis, 58 y.o., female, MRN: [id number]  PREOP DIAGNOSIS:  EPIGASTRIC HERNIA  POSTOP DIAGNOSIS:   Epigastric ventral hernia with incarcerated omentum  PROCEDURE:   Procedure(s):  Combined LAPAROSCOPIC/OPEN EPIGASTRIC HERNIA REPAIR WITH MESH [photos at the end of the op note]  SURGEON:   Alphonsa Overall, M.D.  ASSISTANT:   None  ANESTHESIA:   general  Anesthesiologist: Belinda Block, MD CRNA: Anne Fu, CRNA; Williford, Peggy D, CRNA  General  EBL:  minial  ml  BLOOD ADMINISTERED: none  DRAINS: none   LOCAL MEDICATIONS USED:   20 cc of Exparel and 30 cc of 1/4% marcaine  SPECIMEN:   None  COUNTS CORRECT:  YES  INDICATIONS FOR PROCEDURE:  Donna Willis is a 58 y.o. (DOB: 10-31-1961) white female whose primary care physician is McGowen, Adrian Blackwater, MD and comes for repair of an epigastric hernia.   She had a emergency repair of an incarcerated umbilical hernia in 29/02/7653.  This hernia appears to be above the prior hernia repair.   The indications and risks of the surgery were explained to the patient.  The risks include, but are not limited to, infection, bleeding, and nerve injury.  OPERATIVE NOTE:  The patient was taken to room 2 at [facility].  He underwent a general anesthesia.  He was given 2 grams of Ancef at the beginning of the operation.   A time out was held and the surgical checklist run.   The abdomen was prepped with Cholroprep and sterilely draped.  I covered the abdomen with a Ioban drape.   I accessed the LUQ with a 5 mm trocar.  An additional 5 mm trocar was placed in the left mid abdomen and a 5 mm trocar in the right mid abdomen   Her current hernia is centered about 8 cm above the umbilicus.   The prior repair with Parietex looked good with no adhesions. There was omentum incarcerated in the defect which I reduced.  She had a 4 x 5 cm defect in the fascia which lay about 3 cm above the upper  edge of the prior mesh repair.    Because she had such a large sac, I made an open incision directly over the hernia sac.  I excised the hernia sac.  And I closed the defect with eight 1 Novafil tansversely.  When closed, the hernia repair was about 6 cm across.   I placed a 15 x 20 cm Parietex mesh in the abdomen through the hernia defect, before it was closed.  It had 8 holding sutures of 0 Novafil.  These were tied down to secure the mesh to the anterior abdominal wall.   I placed 4 additional holding sutures through the mesh.  I then used 35 Securestrap tacks to tack the mesh to the anterior abdominal wall.   The abdomen was deflated.  The mesh was inspected and there were no defects around the edge.   The trocars were then removed.  There was no bleeding at the trocar sites.  The midline incision was closed with interrupted 3-0 vicryl in the subcutaneous tissue.  The skin was closed with a running 4-0 monocryl.  The trocar incisions were closed with 4-0 Monocryl and painted with DermaBond. The puncture sites were painted with DermaBond.   The sponge and needle count were correct.  The patient had an abdominal binder placed.  The patient was transferred to the recovery  room in good condition.    Type of repair - primary suture and mesh repair  (choices - primary suture, mesh, or component)  Name of mesh - Parietex  Size of mesh - Length 15 cm, Width 20 cm  Mesh overlap - 8 cm  Placement of mesh - beneath fascia and into the peritoenal cavity  (choices - beneath fascia and into peritoneal cavity, beneath fascia but external to peritoneal cavity, between the muscle and fascia, above or external to fascia)   Left upper - old Parietex seen below the incarcerated omentum Right upper - ventral hernia defect with the Parietex mesh below the current hernia Left lower - current hernia primarily closed Right lower - 15 cm x 20 Parietex covering the current hernia and overlapping the previous  Parietex mesh  Alphonsa Overall, MD, [facility] Pager: [phone] Office phone:  [phone]

## 2019-06-26 NOTE — Transfer of Care (Signed)
Immediate Anesthesia Transfer of Care Note  Patient: Donna Willis  Procedure(s) Performed: LAPAROSCOPIC VERSUS OPEN EPIGASTRIC HERNIA REPAIR WITH MESH (N/A Abdomen)  Patient Location: PACU  Anesthesia Type:General  Level of Consciousness: awake, alert  and oriented  Airway & Oxygen Therapy: Patient Spontanous Breathing and Patient connected to face mask oxygen  Post-op Assessment: Report given to RN and Post -op Vital signs reviewed and stable  Post vital signs: Reviewed and stable  Last Vitals:  Vitals Value Taken Time  BP 139/82 06/26/19 0957  Temp    Pulse 94 06/26/19 0957  Resp 18 06/26/19 0957  SpO2 100 % 06/26/19 0957  Vitals shown include unvalidated device data.  Last Pain:  Vitals:   06/26/19 0533  TempSrc: Oral         Complications: No apparent anesthesia complications

## 2019-06-26 NOTE — Interval H&P Note (Signed)
History and Physical Interval Note:  06/26/2019 7:15 AM  Donna Willis  has presented today for surgery, with the diagnosis of EPIGASTRIC HERNIA.  The various methods of treatment have been discussed with the patient and family.   She is ready for surgery.  Her contact is her son.  After consideration of risks, benefits and other options for treatment, the patient has consented to  Procedure(s): [place] (N/A) as a surgical intervention.  The patient's history has been reviewed, patient examined, no change in status, stable for surgery.  I have reviewed the patient's chart and labs.  Questions were answered to the patient's satisfaction.     Shann Medal

## 2019-06-26 NOTE — Anesthesia Procedure Notes (Signed)
Procedure Name: Intubation Date/Time: 06/26/2019 7:31 AM Performed by: Shebra Muldrow D, CRNA Pre-anesthesia Checklist: Patient identified, Emergency Drugs available, Suction available and Patient being monitored Patient Re-evaluated:Patient Re-evaluated prior to induction Oxygen Delivery Method: Circle system utilized Preoxygenation: Pre-oxygenation with 100% oxygen Induction Type: IV induction Ventilation: Mask ventilation without difficulty Laryngoscope Size: Mac and 4 Grade View: Grade II Tube type: Oral Tube size: 7.0 mm Number of attempts: 1 Airway Equipment and Method: Stylet Placement Confirmation: ETT inserted through vocal cords under direct vision,  positive ETCO2 and breath sounds checked- equal and bilateral Secured at: 22 cm Tube secured with: Tape Dental Injury: Teeth and Oropharynx as per pre-operative assessment

## 2019-06-27 DIAGNOSIS — Z881 Allergy status to other antibiotic agents status: Secondary | ICD-10-CM | POA: Diagnosis not present

## 2019-06-27 DIAGNOSIS — J45909 Unspecified asthma, uncomplicated: Secondary | ICD-10-CM | POA: Diagnosis not present

## 2019-06-27 DIAGNOSIS — F329 Major depressive disorder, single episode, unspecified: Secondary | ICD-10-CM | POA: Diagnosis not present

## 2019-06-27 DIAGNOSIS — Z6841 Body Mass Index (BMI) 40.0 and over, adult: Secondary | ICD-10-CM | POA: Diagnosis not present

## 2019-06-27 DIAGNOSIS — Z79899 Other long term (current) drug therapy: Secondary | ICD-10-CM | POA: Diagnosis not present

## 2019-06-27 DIAGNOSIS — K219 Gastro-esophageal reflux disease without esophagitis: Secondary | ICD-10-CM | POA: Diagnosis not present

## 2019-06-27 DIAGNOSIS — K436 Other and unspecified ventral hernia with obstruction, without gangrene: Secondary | ICD-10-CM | POA: Diagnosis not present

## 2019-06-27 DIAGNOSIS — Z7951 Long term (current) use of inhaled steroids: Secondary | ICD-10-CM | POA: Diagnosis not present

## 2019-06-27 MED ORDER — HYDROCODONE-ACETAMINOPHEN 5-325 MG PO TABS: 1.0000 | ORAL_TABLET | Freq: Four times a day (QID) | ORAL | 0 refills | Status: DC | PRN

## 2019-06-27 MED FILL — HYDROCODON-APAP 5-325: 5-325 | 4 days supply | Qty: 30 | Fill #0

## 2019-06-27 NOTE — Discharge Instructions (Signed)
[facility] - DISCHARGE INSTRUCTIONS TO PATIENT  Activity:  Driving - May drive in 3 to 4 days, if doing well and off pain meds   Lifting - No lifting more than 15 pounds for 3 weeks                       Practice you Covid-19 protection:  Wear a mask, social distance, and wash your hands frequently  Wound Care:   Leave the incision dry until Monday, 8/17, then you may shower.        Wear the abdominal binder during the day.  At night, wearing the binder is optional.  Diet:  As tolerated  Follow up appointment:  Call Dr. Pollie Friar office [facility]) at [phone] for an appointment in 2 weeks.  Medications and dosages:  Resume your home medications.  Call Dr. Lucia Gaskins or his office  [phone]) if you have:  Temperature greater than 100.4,  Persistent nausea and vomiting,  Redness, tenderness, or signs of infection (pain, swelling, redness, odor or green/yellow discharge around the site),  Difficulty breathing, headache or visual disturbances,  Any other questions or concerns you may have after discharge.  In an emergency, call 911 or go to an Emergency Department at a nearby hospital.    HERNIA REPAIR: POST OP INSTRUCTIONS  ######################################################################  EAT Gradually transition to a high fiber diet with a fiber supplement over the next few weeks after discharge.  Start with a pureed / full liquid diet (see below)  WALK Walk an hour a day.  Control your pain to do that.    CONTROL PAIN Control pain so that you can walk, sleep, tolerate sneezing/coughing, and go up/down stairs.  HAVE A BOWEL MOVEMENT DAILY Keep your bowels regular to avoid problems.  OK to try a laxative to override constipation.  OK to use an antidairrheal to slow down diarrhea.  Call if not better after 2 tries  CALL IF YOU HAVE PROBLEMS/CONCERNS Call if you are still struggling despite following these instructions. Call if you  have concerns not answered by these instructions  ######################################################################    1. DIET: Follow a light bland diet & liquids the first 24 hours after arrival home, such as soup, liquids, starches, etc.  Be sure to drink plenty of fluids.  Quickly advance to a usual solid diet within a few days.  Avoid fast food or heavy meals as your are more likely to get nauseated or have irregular bowels.  A low-fat, high-fiber diet for the rest of your life is ideal.   2. Take your usually prescribed home medications unless otherwise directed.  3. PAIN CONTROL: a. Pain is best controlled by a usual combination of three different methods TOGETHER: i. Ice/Heat ii. Over the counter pain medication iii. Prescription pain medication b. Most patients will experience some swelling and bruising around the hernia(s) such as the bellybutton, groins, or old incisions.  Ice packs or heating pads (30-60 minutes up to 6 times a day) will help. Use ice for the first few days to help decrease swelling and bruising, then switch to heat to help relax tight/sore spots and speed recovery.  Some people prefer to use ice alone, heat alone, alternating between ice & heat.  Experiment to what works for you.  Swelling and bruising can take several weeks to resolve.   c. It is helpful to take an over-the-counter pain medication regularly for the first few weeks.  Choose one of the  following that works best for you: i. Naproxen (Aleve, etc)  Two 220mg  tabs twice a day ii. Ibuprofen (Advil, etc) Three 200mg  tabs four times a day (every meal & bedtime) iii. Acetaminophen (Tylenol, etc) 325-650mg  four times a day (every meal & bedtime) d. A  prescription for pain medication should be given to you upon discharge.  Take your pain medication as prescribed.  i. If you are having problems/concerns with the prescription medicine (does not control pain, nausea, vomiting, rash, itching, etc), please  call us [phone] to see if we need to switch you to a different pain medicine that will work better for you and/or control your side effect better. ii. If you need a refill on your pain medication, please contact your pharmacy.  They will contact our office to request authorization. Prescriptions will not be filled after 5 pm or on week-ends.  4. Avoid getting constipated.  Between the surgery and the pain medications, it is common to experience some constipation.  Increasing fluid intake and taking a fiber supplement (such as Metamucil, Citrucel, FiberCon, MiraLax, etc) 1-2 times a day regularly will usually help prevent this problem from occurring.  A mild laxative (prune juice, Milk of Magnesia, MiraLax, etc) should be taken according to package directions if there are no bowel movements after 48 hours.    5. Wash / shower every day.  You may shower over the dressings as they are waterproof.    6. Remove your waterproof bandages, skin tapes, and other bandages 5 days after surgery. You may replace a dressing/Band-Aid to cover the incision for comfort if you wish. You may leave the incisions open to air.  You may replace a dressing/Band-Aid to cover an incision for comfort if you wish.  Continue to shower over incision(s) after the dressing is off.  7. ACTIVITIES as tolerated:   a. You may resume regular (light) daily activities beginning the next day--such as daily self-care, walking, climbing stairs--gradually increasing activities as tolerated.  Control your pain so that you can walk an hour a day.  If you can walk 30 minutes without difficulty, it is safe to try more intense activity such as jogging, treadmill, bicycling, low-impact aerobics, swimming, etc. b. Save the most intensive and strenuous activity for last such as sit-ups, heavy lifting, contact sports, etc  Refrain from any heavy lifting or straining until you are off narcotics for pain control.   c. DO NOT PUSH THROUGH PAIN.  Let  pain be your guide: If it hurts to do something, don't do it.  Pain is your body warning you to avoid that activity for another week until the pain goes down. d. You may drive when you are no longer taking prescription pain medication, you can comfortably wear a seatbelt, and you can safely maneuver your car and apply brakes. e. Dennis Bast may have sexual intercourse when it is comfortable.   8. FOLLOW UP in our office a. Please call [facility] at [phone] to set up an appointment to see your surgeon in the office for a follow-up appointment approximately 2-3 weeks after your surgery. b. Make sure that you call for this appointment the day you arrive home to insure a convenient appointment time.  9.  If you have disability of FMLA / Family leave forms, please bring the forms to the office for processing.  (do not give to your surgeon).  WHEN TO CALL us [phone]: 1. Poor pain control 2. Reactions / problems with new medications (  rash/itching, nausea, etc)  3. Fever over 101.5 F (38.5 C) 4. Inability to urinate 5. Nausea and/or vomiting 6. Worsening swelling or bruising 7. Continued bleeding from incision. 8. Increased pain, redness, or drainage from the incision   The clinic staff is available to answer your questions during regular business hours (8:30am-5pm).  Please dont hesitate to call and ask to speak to one of our nurses for clinical concerns.   If you have a medical emergency, go to the nearest emergency room or call 911.  A surgeon from [facility] is always on call at the hospitals in [facility], [place], [place], [place], [place]  [address] ?  [address] [address], [place], [place]   [address] MAIN: [phone] ? TOLL FREE: [phone] ? FAX: [phone] [email or web address]

## 2019-06-27 NOTE — Discharge Summary (Signed)
Physician Discharge Summary    Patient ID: Donna Willis MRN: [id number] DOB/AGE: [birth year]  58 y.o.  Patient Care Team: Tammi Sou, MD as PCP - General (Family Medicine) Chesley Mires, MD as Consulting Physician (Pulmonary Disease) Gatha Mayer, MD as Consulting Physician (Gastroenterology) Maisie Fus, MD as Consulting Physician (Obstetrics and Gynecology) Alphonsa Overall, MD as Consulting Physician (General Surgery)  Admit date: 06/26/2019  Discharge date: 06/27/2019  Hospital Stay = 0 days    Discharge Diagnoses:  Active Problems:   Incarcerated ventral hernia   1 Day Post-Op  06/26/2019  POST-OPERATIVE DIAGNOSIS:   EPIGASTRIC HERNIA  SURGERY:  06/26/2019  Procedure(s): LAPAROSCOPIC EPIGASTRIC HERNIA REPAIR WITH MESH  SURGEON:    Surgeon(s): Alphonsa Overall, MD  Consults: None  Hospital Course:   The patient underwent the surgery above.  Postoperatively, the patient gradually mobilized and advanced to a solid diet.  Pain and other symptoms were treated aggressively.    By the time of discharge, the patient was walking well the hallways, eating food, having flatus.  Pain was well-controlled on an oral medications.  Based on meeting discharge criteria and continuing to recover, I felt it was safe for the patient to be discharged from the hospital to further recover with close followup. Postoperative recommendations were discussed in detail.  They are written as well.  Discharged Condition: good  Discharge Exam: Blood pressure (!) 103/56, pulse 71, temperature 98.5 F (36.9 C), temperature source Oral, resp. rate 18, height 5\' 5"  (1.651 m), weight 117.5 kg, last menstrual period 05/30/2019, SpO2 96 %.  General: Pt awake/alert/oriented x4 in No acute distress Eyes: PERRL, normal EOM.  Sclera clear.  No icterus Neuro: CN II-XII intact w/o focal sensory/motor deficits. Lymph: No head/neck/groin lymphadenopathy Psych:  No  delerium/psychosis/paranoia HENT: Normocephalic, Mucus membranes moist.  No thrush Neck: Supple, No tracheal deviation Chest: No chest wall pain w good excursion CV:  Pulses intact.  Regular rhythm MS: Normal AROM mjr joints.  No obvious deformity Abdomen: Morbidly obese with abdominal binder in place.  Binder removed by patient. Soft.  Nondistended.  Mildly tender at incisions only.  No evidence of peritonitis.  No incarcerated hernias. Ext:  SCDs BLE.  No mjr edema.  No cyanosis Skin: No petechiae / purpura   Disposition:   Follow-up Information    Alphonsa Overall, MD. Schedule an appointment as soon as possible for a visit in 3 week(s).   Specialty: General Surgery Contact information: [address] [place] [address] [phone]           Discharge disposition: 01-Home or Self Care       Discharge Instructions    Call MD for:   Complete by: As directed    FEVER > 101.5 F  (temperatures < 101.5 F are not significant)   Call MD for:  extreme fatigue   Complete by: As directed    Call MD for:  persistant dizziness or light-headedness   Complete by: As directed    Call MD for:  persistant nausea and vomiting   Complete by: As directed    Call MD for:  redness, tenderness, or signs of infection (pain, swelling, redness, odor or green/yellow discharge around incision site)   Complete by: As directed    Call MD for:  severe uncontrolled pain   Complete by: As directed    Diet - low sodium heart healthy   Complete by: As directed    Start with a bland diet  such as soups, liquids, starchy foods, low fat foods, etc. the first few days at home. Gradually advance to a solid, low-fat, high fiber diet by the end of the first week at home.   Add a fiber supplement to your diet (Metamucil, etc) If you feel full, bloated, or constipated, stay on a full liquid or pureed/blenderized diet for a few days until you feel better and are no longer constipated.    Discharge instructions   Complete by: As directed    See Discharge Instructions If you are not getting better after two weeks or are noticing you are getting worse, contact our office [phone] for further advice.  We may need to adjust your medications, re-evaluate you in the office, send you to the emergency room, or see what other things we can do to help. The clinic staff is available to answer your questions during regular business hours (8:30am-5pm).  Please don't hesitate to call and ask to speak to one of our nurses for clinical concerns.    A surgeon from [facility] is always on call at the hospitals 24 hours/day If you have a medical emergency, go to the nearest emergency room or call 911.   Discharge wound care:   Complete by: As directed    It is good for closed incisions and even open wounds to be washed every day.  Shower every day.  Short baths are fine.  Wash the incisions and wounds clean with soap & water.    You may leave closed incisions open to air if it is dry.   You may cover the incision with clean gauze & replace it after your daily shower for comfort.  DERMABOND:  You have purple skin glue (Dermabond) on your incision(s).  Leave them in place, and they will fall off on their own like a scab.  You may trim any edges that curl up with clean scissors.   Driving Restrictions   Complete by: As directed    You may drive when: - you are no longer taking narcotic prescription pain medication - you can comfortably wear a seatbelt - you can safely make sudden turns/stops without pain.   Increase activity slowly   Complete by: As directed    Start light daily activities --- self-care, walking, climbing stairs- beginning the day after surgery.  Gradually increase activities as tolerated.  Control your pain to be active.  Stop when you are tired.  Ideally, walk several times a day, eventually an hour a day.   Most people are back to most day-to-day activities in  a few weeks.  It takes 4-6 weeks to get back to unrestricted, intense activity. If you can walk 30 minutes without difficulty, it is safe to try more intense activity such as jogging, treadmill, bicycling, low-impact aerobics, swimming, etc. Save the most intensive and strenuous activity for last (Usually 4-8 weeks after surgery) such as sit-ups, heavy lifting, contact sports, etc.  Refrain from any intense heavy lifting or straining until you are off narcotics for pain control.  You will have off days, but things should improve week-by-week. DO NOT PUSH THROUGH PAIN.  Let pain be your guide: If it hurts to do something, don't do it.   Lifting restrictions   Complete by: As directed    If you can walk 30 minutes without difficulty, it is safe to try more intense activity such as jogging, treadmill, bicycling, low-impact aerobics, swimming, etc. Save the most intensive and strenuous activity  for last (Usually 4-8 weeks after surgery) such as sit-ups, heavy lifting, contact sports, etc.   Refrain from any intense heavy lifting or straining until you are off narcotics for pain control.  You will have off days, but things should improve week-by-week. DO NOT PUSH THROUGH PAIN.  Let pain be your guide: If it hurts to do something, don't do it.  Pain is your body warning you to avoid that activity for another week until the pain goes down.   May shower / Bathe   Complete by: As directed    May walk up steps   Complete by: As directed    Remove dressing in 72 hours   Complete by: As directed    Make sure all dressings are removed by the third day after surgery.  Leave incisions open to air.  OK to cover incisions with gauze or bandages as desired   Sexual Activity Restrictions   Complete by: As directed    You may have sexual intercourse when it is comfortable. If it hurts to do something, stop.      Allergies as of 06/27/2019      Reactions   Ciprofloxacin Itching      Medication List    TAKE  these medications   acetaminophen 500 MG tablet Commonly known as: TYLENOL Take 1,000 mg by mouth every 6 (six) hours as needed for moderate pain.   buPROPion 150 MG 24 hr tablet Commonly known as: WELLBUTRIN XL TAKE 1 TABLET BY MOUTH DAILY. What changed: how much to take   famotidine 20 MG tablet Commonly known as: PEPCID Take 20 mg by mouth as needed for heartburn or indigestion.   FLUoxetine 20 MG tablet Commonly known as: PROZAC Take 1 tablet (20 mg total) by mouth daily.   HYDROcodone-acetaminophen 5-325 MG tablet Commonly known as: Norco Take 1-2 tablets by mouth every 6 (six) hours as needed for moderate pain or severe pain.   metroNIDAZOLE 500 MG tablet Commonly known as: Flagyl Take one cap PO every 8 hours   ondansetron 4 MG disintegrating tablet Commonly known as: Zofran ODT 4mg  ODT q4 hours prn nausea/vomit What changed:   how much to take  how to take this  when to take this  reasons to take this  additional instructions   Qvar RediHaler 80 MCG/ACT inhaler Generic drug: beclomethasone INHALE 2 PUFFS INTO THE LUNGS DAILY. What changed:   how much to take  how to take this  when to take this  additional instructions   triamcinolone 55 MCG/ACT Aero nasal inhaler Commonly known as: NASACORT Place 1 spray into the nose daily as needed (allergies).   Ventolin HFA 108 (90 Base) MCG/ACT inhaler Generic drug: albuterol INHALE 2 PUFFS INTO THE LUNGS EVERY 6 HOURS AS NEEDED FOR WHEEZING. What changed:   how much to take  how to take this  when to take this  reasons to take this            Discharge Care Instructions  (From admission, onward)         Start     Ordered   06/27/19 0000  Discharge wound care:    Comments: It is good for closed incisions and even open wounds to be washed every day.  Shower every day.  Short baths are fine.  Wash the incisions and wounds clean with soap & water.    You may leave closed incisions open to  air if it is dry.   You  may cover the incision with clean gauze & replace it after your daily shower for comfort.  DERMABOND:  You have purple skin glue (Dermabond) on your incision(s).  Leave them in place, and they will fall off on their own like a scab.  You may trim any edges that curl up with clean scissors.   06/27/19 0917          Significant Diagnostic Studies:  Results for orders placed or performed during the hospital encounter of 06/26/19 (from the past 72 hour(s))  Pregnancy, urine     Status: None   Collection Time: 06/26/19  5:23 AM  Result Value Ref Range   Preg Test, Ur NEGATIVE NEGATIVE    Comment:        THE SENSITIVITY OF THIS METHODOLOGY IS >20 mIU/mL. Performed at [facility], [place] [address], [place], [place] [address]     No results found.  Past Medical History:  Diagnosis Date   Abdominal wall hernia    Allergic rhinitis    Atypical chest pain 08/2012   Normal Myoview 09/2012   Borderline hyperlipidemia 05/2018   Daytime sleepiness    Epigastric hernia 04/2019   Surgery planned by Dr. 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 Rosana Hoes: hasn't seen him in yrs or so, was on elmiron and coldn't tell much difference.   MDD (major depressive disorder)    Mild persistent asthma 08/31/2013   Stable as of pulm f/u 02/2018: no med changes made---1 yr f/u recommended.   Personal history of  adenomatous colonic polyp 10/28/2012   10/2012 - 4x10 mm tubular adenoma; repeat 09/03/16 showed NO POLYPS.  Recall 5 yrs (08/2021).   Poison oak    or poison ivy   PONV (postoperative nausea and vomiting)    headache    Past Surgical History:  Procedure Laterality Date   CARDIOVASCULAR STRESS TEST  09/2012   Rest/Stress myoview: normal   CESAREAN SECTION  1988   CHOLECYSTECTOMY  2002   COLONOSCOPY W/  POLYPECTOMY  10/22/12; 09/03/16   2013; tubular adenoma, no high grade dysplasia (Dr. Carlean Purl).  Repeat 08/2016: no polyps.  Recall 5 yrs (08/2021--Dr. Carlean Purl).   CYSTOSCOPY     with bladder bx 07/22/18-->   HERNIA REPAIR  6045   umbilical, during lap chole   TONSILLECTOMY AND ADENOIDECTOMY  4098   UMBILICAL HERNIA REPAIR  08/19/2012   Procedure: LAPAROSCOPIC UMBILICAL HERNIA;  Surgeon: Shann Medal, MD;  Location: [facility];  Service: General;  Laterality: N/A;    Social History   Socioeconomic History   Marital status: Married    Spouse name: Not on file   Number of children: Not on file   Years of education: Not on file   Highest education level: Not on file  Occupational History   Not on file  Social Needs   Financial resource strain: Not on file   Food insecurity    Worry: Not on file    Inability: Not on file   Transportation needs    Medical: Not on file    Non-medical: Not on file  Tobacco Use   Smoking status: Never Smoker   Smokeless tobacco: Never Used  Substance and Sexual Activity   Alcohol use: Yes    Comment: rare   Drug use: No   Sexual  activity: Never    Birth control/protection: None  Lifestyle   Physical activity    Days per week: Not on file    Minutes per session: Not on file   Stress: Not on file  Relationships   Social connections    Talks on phone: Not on file    Gets together: Not on file    Attends religious service: Not on file    Active member of club or organization: Not on file    Attends meetings of clubs or organizations: Not on file    Relationship status: Not on file   Intimate partner violence    Fear of current or ex partner: Not on file    Emotionally abused: Not on file    Physically abused: Not on file    Forced sexual activity: Not on file  Other Topics Concern   Not on file  Social History Narrative   Married, 1 son.   Orig from [place] but has lived in [place] >20 yrs.   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   Exercise: walks 40 min 3X/week.     No T/A/Ds    Family History  Problem Relation Age of Onset   Colon polyps Mother    Diverticulitis Father    Other Maternal Uncle        great-stom or colon cancer   Colon cancer Maternal Uncle    Rectal cancer Neg Hx     Current Facility-Administered Medications  Medication Dose Route Frequency Provider Last Rate Last Dose   albuterol (PROVENTIL) (2.5 MG/3ML) 0.083% nebulizer solution 2.5 mg  2.5 mg Nebulization Q6H PRN Alphonsa Overall, MD       buPROPion (WELLBUTRIN XL) 24 hr tablet 150 mg  150 mg Oral Daily Alphonsa Overall, MD       dextrose 5 % and 0.45 % NaCl with KCl 20 mEq/L infusion   Intravenous Continuous Alphonsa Overall, MD 75 mL/hr at 06/26/19 1445     enoxaparin (LOVENOX) injection 40 mg  40 mg Subcutaneous Q24H Alphonsa Overall, MD   40 mg at 06/27/19 0806   FLUoxetine (PROZAC) capsule 20 mg  20 mg Oral Daily Lenis Noon, RPH       HYDROcodone-acetaminophen (NORCO/VICODIN) 5-325 MG per tablet 1-2 tablet  1-2 tablet Oral Q4H PRN Alphonsa Overall, MD   2 tablet at 06/26/19 2353   morphine 4 MG/ML injection 1-3 mg  1-3 mg Intravenous Q2H PRN Alphonsa Overall, MD       ondansetron (ZOFRAN-ODT) disintegrating tablet 4 mg  4 mg Oral Q6H PRN Alphonsa Overall, MD       Or   ondansetron [facility]) injection 4 mg  4 mg Intravenous Q6H PRN Alphonsa Overall, MD       traMADol Veatrice Bourbon) tablet 50 mg  50 mg Oral Q6H PRN Alphonsa Overall, MD   50 mg at 06/26/19 2113   triamcinolone (NASACORT) nasal inhaler 1 spray  1 spray Nasal Daily PRN Alphonsa Overall, MD         Allergies  Allergen Reactions   Ciprofloxacin Itching    Signed: Morton Peters, MD, FACS, MASCRS Gastrointestinal and Minimally Invasive Surgery    [address] [place], [place] [address] [phone] Main / Paging [phone] Fax   06/27/2019, 9:17 AM

## 2019-06-29 ENCOUNTER — Encounter (HOSPITAL_COMMUNITY): Payer: Self-pay | Admitting: Surgery

## 2019-06-30 ENCOUNTER — Encounter: Payer: Self-pay | Admitting: Family Medicine

## 2019-07-04 NOTE — Progress Notes (Signed)
Reviewed and agree with assessment/plan.   Philo Kurtz, MD [facility] 11/07/2016, 12:24 PM Pager:  [phone]  

## 2019-07-05 MED FILL — buPROPion HCL ER (XL) 150 M: 150 | 30 days supply | Qty: 30 | Fill #0

## 2019-07-08 ENCOUNTER — Encounter: Payer: 59 | Admitting: Family Medicine

## 2019-07-13 ENCOUNTER — Encounter: Payer: Self-pay | Admitting: Family Medicine

## 2019-07-13 ENCOUNTER — Ambulatory Visit (INDEPENDENT_AMBULATORY_CARE_PROVIDER_SITE_OTHER): Payer: 59 | Admitting: Family Medicine

## 2019-07-13 ENCOUNTER — Other Ambulatory Visit: Payer: Self-pay

## 2019-07-13 VITALS — BP 121/66 | HR 68 | Temp 98.3°F | Wt 254.0 lb

## 2019-07-13 DIAGNOSIS — M25511 Pain in right shoulder: Secondary | ICD-10-CM | POA: Diagnosis not present

## 2019-07-13 MED ORDER — DICLOFENAC SODIUM 1 % TD GEL: 4.0000 g | Freq: Four times a day (QID) | TRANSDERMAL | 11 refills | Status: DC

## 2019-07-13 MED FILL — DICLOFENAC SODIUM 1 % GEL: 1 | 7 days supply | Qty: 100 | Fill #0

## 2019-07-13 NOTE — Progress Notes (Signed)
    Subjective:    CC: Right shoulder pain  HPI: Donna Willis developed right shoulder pain about 6 weeks ago.  She was helping her husband get up off the floor when she felt a pulling sensation in her shoulder.  She is had pain since.  Pain is mild to moderate.  Pain is located in the lateral aspect to anterior aspect of her upper arm and shoulder.  Pain is worse with overhead motion and reaching back and at bedtime.  She denies any pain radiating below the level of her elbow.  No weakness or numbness distally.  She is tried a little exercises on her own which helped a little.  She feels well otherwise.  She notes that she had hernia surgery about 2 weeks ago.  She has been using her arms a bit more than usual.    Past medical history, Surgical history, Family history not pertinant except as noted below, Social history, Allergies, and medications have been entered into the medical record, reviewed, and no changes needed.   Review of Systems: No headache, visual changes, nausea, vomiting, diarrhea, constipation, dizziness, abdominal pain, skin rash, fevers, chills, night sweats, weight loss, swollen lymph nodes, body aches, joint swelling, muscle aches, chest pain, shortness of breath, mood changes, visual or auditory hallucinations.   Objective:    Vitals:   07/13/19 1410  BP: 121/66  Pulse: 68  Temp: 98.3 F (36.8 C)   General: Well Developed, well nourished, and in no acute distress.  Neuro/Psych: Alert and oriented x3, extra-ocular muscles intact, able to move all 4 extremities, sensation grossly intact. Skin: Warm and dry, no rashes noted.  Respiratory: Not using accessory muscles, speaking in full sentences, trachea midline.  Cardiovascular: Pulses palpable, no extremity edema. Abdomen: Does not appear distended. MSK:  C-spine nontender to midline normal cervical motion. Right shoulder normal-appearing nontender. Range of motion: Normal abduction pain at 130 degrees.  Normal  external rotation.  Internal rotation limited to lumbar spine. Strength is intact. Positive Hawkins and Neer's test. Minimally positive empty can test. Negative Yergason's and speeds test.  Contralateral left shoulder normal-appearing nontender normal motion normal strength.  Pulses cap refill and sensation are intact distally.    Impression and Recommendations:    Assessment and Plan: 58 y.o. female with right shoulder pain.  Likely rotator cuff strain and rotator cuff tendinitis.  Plan to work on home exercise program and trial of diclofenac gel.  If not improving patient will let me know and I will order physical therapy.  Check back in about a month.  No need for follow-up if completely better.  Return sooner if needed.  PDMP not reviewed this encounter. No orders of the defined types were placed in this encounter.  Meds ordered this encounter  Medications  . diclofenac sodium (VOLTAREN) 1 % GEL    Sig: Apply 4 g topically 4 (four) times daily. To affected joint.    Dispense:  100 g    Refill:  11    Discussed warning signs or symptoms. Please see discharge instructions. Patient expresses understanding.

## 2019-07-13 NOTE — Patient Instructions (Signed)
Thank you for coming in today. Do the home exercises.  Work on range motion.  Also do the band exercises.  Up to the front and up to side.  Keep the elbow straight and thumb up.   Do the internal and exeternal rotation.  Keep the elbow pinned to the side.   Do about 30 reps 2-3x daily  Apply diclofenac gel up to 4x daily for pain as needed.   Recheck in 1 month if not better.   If really not doing well let me know 2 weeks. I will start PT and delay follow up a bit.    Secondary Shoulder Impingement Syndrome Rehab Ask your health care provider which exercises are safe for you. Do exercises exactly as told by your health care provider and adjust them as directed. It is normal to feel mild stretching, pulling, tightness, or discomfort as you do these exercises. Stop right away if you feel sudden pain or your pain gets worse. Do not begin these exercises until told by your health care provider. Stretching and range-of-motion exercise This exercise warms up your muscles and joints and improves the movement and flexibility of your neck and shoulder. This exercise also helps to relieve pain and stiffness. Cervical side bend  1. Using good posture, sit on a stable chair, or stand up. 2. Without moving your shoulders, slowly tilt your left / right ear toward your left / right shoulder until you feel a stretch in your neck (cervical) muscles on the other side. You should be looking straight ahead. 3. Hold for __________ seconds. 4. Slowly return to the starting position. 5. Repeat the stretch on your left / right side. Repeat __________ times. Complete this exercise __________ times a day. Strengthening exercises These exercises build strength and endurance in your shoulder. Endurance is the ability to use your muscles for a long time, even after they get tired. Scapular protraction, supine  1. Lie on your back on a firm surface (supine position). Hold a __________ weight in your left /  right hand. 2. Raise your left / right arm straight into the air so your hand is directly above your shoulder joint. 3. Push the weight into the air so your shoulder (scapula) lifts off the surface that you are lying on. The scapula will push up or forward (protraction). Do not move your head, neck, or back. 4. Hold for __________ seconds. 5. Slowly return to the starting position. Let your muscles relax completely before you repeat this exercise. Repeat __________ times. Complete this exercise __________ times a day. Scapular retraction  1. Sit in a stable chair without armrests, or stand up. 2. Secure an exercise band to a stable object in front of you so the band is at shoulder height. 3. Hold one end of the exercise band in each hand. Your palms should face down. 4. Squeeze your shoulder blades (scapulae) together and move your elbows slightly behind you (retraction). Do not shrug your shoulders upward while you do this. 5. Hold for __________ seconds. 6. Slowly return to the starting position. Repeat __________ times. Complete this exercise __________ times a day. Shoulder extension with scapular retraction  1. Sit in a stable chair without armrests, or stand up. 2. Secure an exercise band to a stable object in front of you so the band is above shoulder height. 3. Hold one end of the exercise band in each hand. 4. Straighten your elbows and lift your hands up to shoulder height. 5. Squeeze your  shoulder blades together (scapular retraction) and pull your hands down to the sides of your thighs (shoulder extension). Stop when your hands are straight down by your sides. Do not let your hands go behind your body. 6. Hold for __________ seconds. 7. Slowly return to the starting position. Repeat __________ times. Complete this exercise __________ times a day. Shoulder abduction 1. Sit in a stable chair without armrests, or stand up. 2. If directed, hold a __________ weight in your left /  right hand. 3. Start with your arms straight down. Turn your left / right hand so your palm faces in, toward your body. 4. Slowly lift your left / right hand out to your side (abduction). Do not lift your hand above shoulder height. ? Keep your arms straight. ? Avoid shrugging your shoulder while you do this movement. Keep your shoulder blade tucked down toward the middle of your back. 5. Hold for __________ seconds. 6. Slowly lower your arm, and return to the starting position. Repeat __________ times. Complete this exercise __________ times a day. This information is not intended to replace advice given to you by your health care provider. Make sure you discuss any questions you have with your health care provider. Document Released: 10/29/2005 Document Revised: 02/20/2019 Document Reviewed: 12/04/2018 Elsevier Patient Education  2020 [employer].

## 2019-07-21 ENCOUNTER — Encounter: Payer: 59 | Admitting: Family Medicine

## 2019-07-24 MED FILL — DICLOFENAC SODIUM 1 % GEL: 1 | 7 days supply | Qty: 100 | Fill #1

## 2019-07-27 MED FILL — QVAR REDIHALER 80 MCG/ACT A: 80 | 60 days supply | Qty: 11 | Fill #1

## 2019-07-29 MED FILL — buPROPion HCL ER (XL) 150 M: 150 | 30 days supply | Qty: 30 | Fill #1

## 2019-07-31 ENCOUNTER — Other Ambulatory Visit: Payer: Self-pay | Admitting: Family Medicine

## 2019-07-31 MED FILL — FLUoxetine HCL 20 MG CAPS: 20 | 90 days supply | Qty: 90 | Fill #0

## 2019-08-05 ENCOUNTER — Ambulatory Visit: Payer: 59 | Admitting: Dietician

## 2019-08-13 DIAGNOSIS — R7303 Prediabetes: Secondary | ICD-10-CM

## 2019-08-13 HISTORY — DX: Prediabetes: R73.03

## 2019-08-16 ENCOUNTER — Encounter: Payer: Self-pay | Admitting: Family Medicine

## 2019-08-20 ENCOUNTER — Other Ambulatory Visit: Payer: Self-pay

## 2019-08-20 ENCOUNTER — Ambulatory Visit (INDEPENDENT_AMBULATORY_CARE_PROVIDER_SITE_OTHER): Payer: 59 | Admitting: Family Medicine

## 2019-08-20 ENCOUNTER — Encounter: Payer: Self-pay | Admitting: Family Medicine

## 2019-08-20 VITALS — BP 118/76 | HR 68 | Temp 97.3°F | Resp 16 | Ht 64.5 in | Wt 246.2 lb

## 2019-08-20 DIAGNOSIS — R7301 Impaired fasting glucose: Secondary | ICD-10-CM

## 2019-08-20 DIAGNOSIS — Z1231 Encounter for screening mammogram for malignant neoplasm of breast: Secondary | ICD-10-CM

## 2019-08-20 DIAGNOSIS — Z1211 Encounter for screening for malignant neoplasm of colon: Secondary | ICD-10-CM

## 2019-08-20 DIAGNOSIS — Z23 Encounter for immunization: Secondary | ICD-10-CM | POA: Diagnosis not present

## 2019-08-20 DIAGNOSIS — Z124 Encounter for screening for malignant neoplasm of cervix: Secondary | ICD-10-CM

## 2019-08-20 DIAGNOSIS — E7800 Pure hypercholesterolemia, unspecified: Secondary | ICD-10-CM | POA: Diagnosis not present

## 2019-08-20 DIAGNOSIS — Z0000 Encounter for general adult medical examination without abnormal findings: Secondary | ICD-10-CM | POA: Diagnosis not present

## 2019-08-20 LAB — LIPID PANEL
Cholesterol: 206 mg/dL — ABNORMAL HIGH (ref 0–200)
HDL: 58.7 mg/dL (ref >39.00)
LDL Cholesterol: 126 mg/dL — ABNORMAL HIGH (ref 0–99)
NonHDL: 147.01
Total CHOL/HDL Ratio: 4
Triglycerides: 107 mg/dL (ref 0.0–149.0)
VLDL: 21.4 mg/dL (ref 0.0–40.0)

## 2019-08-20 LAB — HEMOGLOBIN A1C: Hgb A1c MFr Bld: 6.4 % (ref 4.6–6.5)

## 2019-08-20 LAB — GLUCOSE, RANDOM: Glucose, Bld: 97 mg/dL (ref 70–99)

## 2019-08-20 LAB — TSH: TSH: 1.7 u[IU]/mL (ref 0.35–4.50)

## 2019-08-20 NOTE — Patient Instructions (Signed)
Health Maintenance, Female Adopting a healthy lifestyle and getting preventive care are important in promoting health and wellness. Ask your health care provider about:  The right schedule for you to have regular tests and exams.  Things you can do on your own to prevent diseases and keep yourself healthy. What should I know about diet, weight, and exercise? Eat a healthy diet   Eat a diet that includes plenty of vegetables, fruits, low-fat dairy products, and lean protein.  Do not eat a lot of foods that are high in solid fats, added sugars, or sodium. Maintain a healthy weight Body mass index (BMI) is used to identify weight problems. It estimates body fat based on height and weight. Your health care provider can help determine your BMI and help you achieve or maintain a healthy weight. Get regular exercise Get regular exercise. This is one of the most important things you can do for your health. Most adults should:  Exercise for at least 150 minutes each week. The exercise should increase your heart rate and make you sweat (moderate-intensity exercise).  Do strengthening exercises at least twice a week. This is in addition to the moderate-intensity exercise.  Spend less time sitting. Even light physical activity can be beneficial. Watch cholesterol and blood lipids Have your blood tested for lipids and cholesterol at 57 years of age, then have this test every 5 years. Have your cholesterol levels checked more often if:  Your lipid or cholesterol levels are high.  You are older than 57 years of age.  You are at high risk for heart disease. What should I know about cancer screening? Depending on your health history and family history, you may need to have cancer screening at various ages. This may include screening for:  Breast cancer.  Cervical cancer.  Colorectal cancer.  Skin cancer.  Lung cancer. What should I know about heart disease, diabetes, and high blood  pressure? Blood pressure and heart disease  High blood pressure causes heart disease and increases the risk of stroke. This is more likely to develop in people who have high blood pressure readings, are of African descent, or are overweight.  Have your blood pressure checked: ? Every 3-5 years if you are 18-39 years of age. ? Every year if you are 40 years old or older. Diabetes Have regular diabetes screenings. This checks your fasting blood sugar level. Have the screening done:  Once every three years after age 40 if you are at a normal weight and have a low risk for diabetes.  More often and at a younger age if you are overweight or have a high risk for diabetes. What should I know about preventing infection? Hepatitis B If you have a higher risk for hepatitis B, you should be screened for this virus. Talk with your health care provider to find out if you are at risk for hepatitis B infection. Hepatitis C Testing is recommended for:  Everyone born from 1945 through 1965.  Anyone with known risk factors for hepatitis C. Sexually transmitted infections (STIs)  Get screened for STIs, including gonorrhea and chlamydia, if: ? You are sexually active and are younger than 57 years of age. ? You are older than 57 years of age and your health care provider tells you that you are at risk for this type of infection. ? Your sexual activity has changed since you were last screened, and you are at increased risk for chlamydia or gonorrhea. Ask your health care provider if   you are at risk.  Ask your health care provider about whether you are at high risk for HIV. Your health care provider may recommend a prescription medicine to help prevent HIV infection. If you choose to take medicine to prevent HIV, you should first get tested for HIV. You should then be tested every 3 months for as long as you are taking the medicine. Pregnancy  If you are about to stop having your period (premenopausal) and  you may become pregnant, seek counseling before you get pregnant.  Take 400 to 800 micrograms (mcg) of folic acid every day if you become pregnant.  Ask for birth control (contraception) if you want to prevent pregnancy. Osteoporosis and menopause Osteoporosis is a disease in which the bones lose minerals and strength with aging. This can result in bone fractures. If you are 65 years old or older, or if you are at risk for osteoporosis and fractures, ask your health care provider if you should:  Be screened for bone loss.  Take a calcium or vitamin D supplement to lower your risk of fractures.  Be given hormone replacement therapy (HRT) to treat symptoms of menopause. Follow these instructions at home: Lifestyle  Do not use any products that contain nicotine or tobacco, such as cigarettes, e-cigarettes, and chewing tobacco. If you need help quitting, ask your health care provider.  Do not use street drugs.  Do not share needles.  Ask your health care provider for help if you need support or information about quitting drugs. Alcohol use  Do not drink alcohol if: ? Your health care provider tells you not to drink. ? You are pregnant, may be pregnant, or are planning to become pregnant.  If you drink alcohol: ? Limit how much you use to 0-1 drink a day. ? Limit intake if you are breastfeeding.  Be aware of how much alcohol is in your drink. In the [place] one drink equals one 12 oz bottle of beer (355 mL), one 5 oz glass of wine (148 mL), or one 1 oz glass of hard liquor (44 mL). General instructions  Schedule regular health, dental, and eye exams.  Stay current with your vaccines.  Tell your health care provider if: ? You often feel depressed. ? You have ever been abused or do not feel safe at home. Summary  Adopting a healthy lifestyle and getting preventive care are important in promoting health and wellness.  Follow your health care provider's instructions about healthy  diet, exercising, and getting tested or screened for diseases.  Follow your health care provider's instructions on monitoring your cholesterol and blood pressure. This information is not intended to replace advice given to you by your health care provider. Make sure you discuss any questions you have with your health care provider. Document Released: 05/14/2011 Document Revised: 10/22/2018 Document Reviewed: 10/22/2018 Elsevier Patient Education  2020 Elsevier Inc.  

## 2019-08-20 NOTE — Progress Notes (Signed)
Office Note 08/20/2019  CC:  Chief Complaint  Patient presents with  . Annual Exam    pt is fasting    HPI:  Donna Willis is a 58 y.o. White female who is here for annual health maintenance exam.  Pushing the activity level and dietary changes and has lost 8 lbs in the last 6 wks. Her goal wt loss is 1-2 lbs per week, currently getting followed [facility].  Feeling well. She got epigastric hernia repair 04/2019, still mild soreness with pushing on upper abd, o/w fine. Says mood and anxiety are stable, desires no change in med/dosing.  Past Medical History:  Diagnosis Date  . Abdominal wall hernia   . Allergic rhinitis   . Atypical chest pain 08/2012   Normal Myoview 09/2012  . Borderline hyperlipidemia 05/2018  . Contact dermatitis    poison ivy/oak  . Daytime sleepiness   . Epigastric hernia 04/2019   Repaired 06/26/19  . GERD (gastroesophageal reflux disease)   . Hay fever    seasonal  . Hernia   . 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 IFG (impaired fasting glucose)   . Interstitial cystitis    Dr. Rosana Hoes: hasn't seen him in yrs or so, was on elmiron and coldn't tell much difference.  . MDD (major depressive disorder)   . Mild persistent asthma 08/31/2013   [facility]: stable as of pulm f/u 02/2018 and 05/2019: no med changes made---1 yr f/u recommended.  . Personal history of  adenomatous colonic polyp 10/28/2012   10/2012 - 4x10 mm tubular adenoma; repeat 09/03/16 showed NO POLYPS.  Recall 5 yrs (08/2021).    Past Surgical History:  Procedure Laterality Date  . CARDIOVASCULAR STRESS TEST  09/2012   Rest/Stress myoview: normal  . CESAREAN SECTION  1988  . CHOLECYSTECTOMY  2002  . COLONOSCOPY W/ POLYPECTOMY  10/22/12; 09/03/16   2013; tubular adenoma, no high grade dysplasia (Dr. Carlean Purl).  Repeat 08/2016: no polyps.  Recall 5 yrs (08/2021--Dr. Carlean Purl).  . CYSTOSCOPY     with bladder bx 07/22/18-->  . EPIGASTRIC HERNIA REPAIR N/A  06/26/2019   Procedure: LAPAROSCOPIC EPIGASTRIC HERNIA REPAIR WITH MESH;  Surgeon: Alphonsa Overall, MD;  Location: [facility];  Service: General;  Laterality: N/A;  . HERNIA REPAIR  [id number]   umbilical, during lap chole  . TONSILLECTOMY AND ADENOIDECTOMY  1974  . UMBILICAL HERNIA REPAIR  08/19/2012   Procedure: LAPAROSCOPIC UMBILICAL HERNIA;  Surgeon: Shann Medal, MD;  Location: [facility];  Service: General;  Laterality: N/A;    Family History  Problem Relation Age of Onset  . Colon polyps Mother   . Diverticulitis Father   . Other Maternal Uncle        great-stom or colon cancer  . Colon cancer Maternal Uncle   . Rectal cancer Neg Hx     Social History   Socioeconomic History  . Marital status: Married    Spouse name: Not on file  . Number of children: Not on file  . Years of education: Not on file  . Highest education level: Not on file  Occupational History  . Not on file  Social Needs  . Financial resource strain: Not on file  . Food insecurity    Worry: Not on file    Inability: Not on file  . Transportation needs    Medical: Not on file    Non-medical: Not on file  Tobacco Use  . Smoking status: Never Smoker  .  Smokeless tobacco: Never Used  Substance and Sexual Activity  . Alcohol use: Yes    Comment: rare  . Drug use: No  . Sexual activity: Never    Birth control/protection: None  Lifestyle  . Physical activity    Days per week: Not on file    Minutes per session: Not on file  . Stress: Not on file  Relationships  . Social Herbalist on phone: Not on file    Gets together: Not on file    Attends religious service: Not on file    Active member of club or organization: Not on file    Attends meetings of clubs or organizations: Not on file    Relationship status: Not on file  . Intimate partner violence    Fear of current or ex partner: Not on file    Emotionally abused: Not on file    Physically abused: Not on file    Forced sexual activity: Not on  file  Other Topics Concern  . Not on file  Social History Narrative   Married, 1 son.   Orig from [place] but has lived in [place] >20 yrs.   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   Exercise: walks 40 min 3X/week.     No T/A/Ds    Outpatient Medications Prior to Visit  Medication Sig Dispense Refill  . acetaminophen (TYLENOL) 500 MG tablet Take 1,000 mg by mouth every 6 (six) hours as needed for moderate pain.    . beclomethasone (QVAR REDIHALER) 80 MCG/ACT inhaler INHALE 2 PUFFS INTO THE LUNGS DAILY. (Patient taking differently: Inhale 2 puffs into the lungs daily. ) 10.6 g 11  . buPROPion (WELLBUTRIN XL) 150 MG 24 hr tablet TAKE 1 TABLET BY MOUTH DAILY. (Patient taking differently: Take by mouth daily. ) 30 tablet 1  . diclofenac sodium (VOLTAREN) 1 % GEL Apply 4 g topically 4 (four) times daily. To affected joint. 100 g 11  . famotidine (PEPCID) 20 MG tablet Take 20 mg by mouth as needed for heartburn or indigestion.    Marland Kitchen FLUoxetine (PROZAC) 20 MG capsule TAKE 1 CAPSULE BY MOUTH ONCE DAILY 90 capsule 1  . triamcinolone (NASACORT) 55 MCG/ACT AERO nasal inhaler Place 1 spray into the nose daily as needed (allergies).     . VENTOLIN HFA 108 (90 Base) MCG/ACT inhaler INHALE 2 PUFFS INTO THE LUNGS EVERY 6 HOURS AS NEEDED FOR WHEEZING. (Patient taking differently: Inhale 2 puffs into the lungs every 6 (six) hours as needed for wheezing or shortness of breath. INHALE 2 PUFFS INTO THE LUNGS EVERY 6 HOURS AS NEEDED FOR WHEEZING.) 18 g 5  . FLUoxetine (PROZAC) 20 MG tablet Take 1 tablet (20 mg total) by mouth daily. (Patient not taking: Reported on 08/20/2019) 30 tablet 3  . ondansetron (ZOFRAN ODT) 4 MG disintegrating tablet 4mg  ODT q4 hours prn nausea/vomit (Patient not taking: Reported on 08/20/2019) 8 tablet 0   No facility-administered medications prior to visit.     Allergies  Allergen Reactions  . Ciprofloxacin Itching    ROS Review of  Systems  Constitutional: Negative for appetite change, chills, fatigue and fever.  HENT: Negative for congestion, dental problem, ear pain and sore throat.   Eyes: Negative for discharge, redness and visual disturbance.  Respiratory: Negative for cough, chest tightness, shortness of breath and wheezing.   Cardiovascular: Negative for chest pain, palpitations and leg swelling.  Gastrointestinal: Negative for abdominal  pain, blood in stool, diarrhea, nausea and vomiting.  Genitourinary: Negative for difficulty urinating, dysuria, flank pain, frequency, hematuria and urgency.  Musculoskeletal: Negative for arthralgias, back pain, joint swelling, myalgias and neck stiffness.  Skin: Negative for pallor and rash.  Neurological: Negative for dizziness, speech difficulty, weakness and headaches.  Hematological: Negative for adenopathy. Does not bruise/bleed easily.  Psychiatric/Behavioral: Negative for confusion and sleep disturbance. The patient is not nervous/anxious.     PE; Blood pressure 118/76, pulse 68, temperature (!) 97.3 F (36.3 C), temperature source Temporal, resp. rate 16, height 5' 4.5" (1.638 m), weight 246 lb 3.2 oz (111.7 kg), SpO2 98 %. Body mass index is 41.61 kg/m. Exam chaperoned by Deveron Furlong, CMA.  Gen: Alert, well appearing.  Patient is oriented to person, place, time, and situation. AFFECT: pleasant, lucid thought and speech. ENT: Ears: EACs clear, normal epithelium.  TMs with good light reflex and landmarks bilaterally.  Eyes: no injection, icteris, swelling, or exudate.  EOMI, PERRLA. Nose: no drainage or turbinate edema/swelling.  No injection or focal lesion.  Mouth: lips without lesion/swelling.  Oral mucosa pink and moist.  Dentition intact and without obvious caries or gingival swelling.  Oropharynx without erythema, exudate, or swelling.  Neck: supple/nontender.  No LAD, mass, or TM.  Carotid pulses 2+ bilaterally, without bruits. CV: RRR, no m/r/g.   LUNGS:  CTA bilat, nonlabored resps, good aeration in all lung fields. ABD: soft, NT, ND, BS normal.  No hepatospenomegaly or mass.  No bruits. EXT: no clubbing, cyanosis, or edema.  Musculoskeletal: no joint swelling, erythema, warmth, or tenderness.  ROM of all joints intact. Skin - no sores or suspicious lesions or rashes or color changes   Pertinent labs:  Lab Results  Component Value Date   TSH 1.81 06/03/2018   Lab Results  Component Value Date   WBC 6.6 06/23/2019   HGB 13.0 06/23/2019   HCT 42.7 06/23/2019   MCV 93.0 06/23/2019   PLT 249 06/23/2019   Lab Results  Component Value Date   CREATININE 0.81 04/26/2019   BUN 8 04/26/2019   NA 139 04/26/2019   K 3.6 04/26/2019   CL 107 04/26/2019   CO2 22 04/26/2019   Lab Results  Component Value Date   ALT 24 04/26/2019   AST 22 04/26/2019   ALKPHOS 55 04/26/2019   BILITOT 0.4 04/26/2019   Lab Results  Component Value Date   CHOL 228 (H) 06/03/2018   Lab Results  Component Value Date   HDL 52.90 06/03/2018   Lab Results  Component Value Date   LDLCALC 146 (H) 06/03/2018   Lab Results  Component Value Date   TRIG 142.0 06/03/2018   Lab Results  Component Value Date   CHOLHDL 4 06/03/2018   No results found for: HGBA1C  ASSESSMENT AND PLAN:   Health maintenance exam: Reviewed age and gender appropriate health maintenance issues (prudent diet, regular exercise, health risks of tobacco and excessive alcohol, use of seatbelts, fire alarms in home, use of sunscreen).  Also reviewed age and gender appropriate health screening as well as vaccine recommendations. Vaccines: flu vaccine->given today.  All other recommended vaccines are UTD, including shingrix. Labs: Has had recent CBC and CMET that were normal.  Has hx of IFG and mild hypercholesterolemia->will check A1c, fasting glucose, , TSH, and FLP. Cervical ca screening: Has GYN, next pap due 04/2021 Breast ca screening: Self reported mammo normal 04/2018.  Due for  repeat screening mammo now->she'll call her GYN. Colon ca  screening: next colonoscopy due 2022.  An After Visit Summary was printed and given to the patient.  FOLLOW UP:  Return in about 6 months (around 02/18/2020) for routine chronic illness f/u.  Signed:  Crissie Sickles, MD           08/20/2019

## 2019-08-20 NOTE — Addendum Note (Signed)
Addended by: Deveron Furlong D on: 08/20/2019 01:28 PM   Modules accepted: Orders

## 2019-08-21 ENCOUNTER — Encounter: Payer: Self-pay | Admitting: Family Medicine

## 2019-08-24 NOTE — Telephone Encounter (Signed)
Patient called back and we discussed that patient should call her pharmacy to ask about the recall. If there is a recall on the medication we prescribed to call back and we will talk to Dr. Anitra Lauth about it

## 2019-08-24 NOTE — Telephone Encounter (Signed)
Called patietn and asked for a call back to get clarification.

## 2019-08-28 ENCOUNTER — Other Ambulatory Visit: Payer: Self-pay | Admitting: Family Medicine

## 2019-08-28 MED FILL — buPROPion HCL ER (XL) 150 M: 150 | 30 days supply | Qty: 30 | Fill #0

## 2019-09-16 ENCOUNTER — Encounter: Payer: Self-pay | Admitting: Family Medicine

## 2019-09-16 ENCOUNTER — Other Ambulatory Visit: Payer: Self-pay

## 2019-09-16 ENCOUNTER — Encounter: Payer: 59 | Attending: Family Medicine | Admitting: Dietician

## 2019-09-16 ENCOUNTER — Other Ambulatory Visit: Payer: Self-pay | Admitting: Family Medicine

## 2019-09-16 DIAGNOSIS — R7303 Prediabetes: Secondary | ICD-10-CM | POA: Insufficient documentation

## 2019-09-16 MED ORDER — METFORMIN HCL 500 MG PO TABS: ORAL_TABLET | ORAL | 1 refills | Status: DC

## 2019-09-16 NOTE — Patient Instructions (Signed)
If you'd like, start tracking your food intake. Start with tracking everything you eat and drink a couple of days per week.   Try using an app such as MyFitness Pal, or you can simply write it down on paper.   Remember to start paying attention to which foods you eat that have carbs in them. Use the yellow sheet we went over today to determine how many servings of carbohydrates you are eating per meal and snack.   GREAT job with walking! Keep up the great work! Consider trying other exercises as well (see handout.)   Continue to incorporate lots of veggies, really good job with this!   Try vegetable based pastas/rice as a way to sneak more in.   If you have breakfast, use the Breakfast Ideas sheet for ideas for building balanced first meal.

## 2019-09-16 NOTE — Progress Notes (Signed)
Medical Nutrition Therapy  Follow-Up Visit Appt Start Time: 4:30pm   End Time: 5:15pm  Patient was originally seen on 06/10/2019 for morbid obesity and weight management for planned epigastric hernia repair.  Primary concerns today: prediabetes    NUTRITION ASSESSMENT   Clinical Medical Hx: obesity, asthma, thrush, chronic interstitial cystitis, umbilical hernia, colonic polyps, major depressive disorder, prediabetes (new) Medications: see list  Labs: A1c 6/4%  Lifestyle & Dietary Hx Patient states her recent hernia repair went well. States she would love to lose weight for her health's sake and prevent further hernias. States she recently found out she is borderline diabetic and wants to prevent this. Cares for her husband, and her son lives with them to help care for him.   States she is pleased that having her weight checked is not a requirement at our office, and asked why that is. We discussed how weight is not the sole determinant of health and that the primary focus is on providing our bodies nutrition.   States that recently she has been fasting for 16 hours (skip breakfast) per day, been trying it for a couple of weeks. States she independently decided to try this because she has heard it is popular. However, states she is open to trying whatever will better help control her blood sugar.   May have pizza for supper but will have salad with it. Working on including more veggies! Cutting back on processed foods and trying to do less fast food/ frozen meals. States they are typically a "fast food and convenient food family." Thinks she can "give up" desserts more easily than bread and crackers. May snack on almonds or Fiber One bars. May have some coffee w/ sugar-free creamer. Been trying to cut back on sweets as much as possible. Drinking more water!   Estimated daily fluid intake: 64 oz Supplements: zinc, vitamin C  Stress / self-care: very stressed taking care of husband who is  disabled; for self-care, likes to get outside and walk, read, and play games on her phone  Current average weekly physical activity: walking (a lot more now! 2-3 miles per day)   24-Hr Dietary Recall First Meal: - Snack: - Second Meal: tuna packet + salad  Snack: 2 pieces candy  Third Meal: burrito bowl w/o rice  Snack: -  Beverages: water, Diet Coke, coffee w/ sugar-free creamer   Estimated Energy Needs Calories: 1600 Carbohydrate: 180g Protein: 100g Fat: 53g   NUTRITION DIAGNOSIS  Diagnosis on 06/10/2019: Excessive oral intake (NI-2.2) related to undesirable food choices as evidenced by pt reported dietary hx of intakes of foods (amounts and types) which exceed estimated energy needs, referral for obesity, and request for dietary/nutrition counseling for weight loss. Status: Continue   NUTRITION INTERVENTION  Nutrition education (E-1) on the following topics:  A1c and blood sugar, factors that affect blood sugar  Carbohydrate counting, balancing meals/snacks with protein, small frequent meals and snacks starting with breakfast (w/in 1-2 hours of waking)   Strategies for incorporating more veggies   New physical activity ideas- pt requested ideas since it gets darker earlier and this gets in the way of walking as much as she'd like  Handouts Provided Include   Carb Servings per Meal/Snack (yellow card)   ADA: 3 Types of Exercise   Learning Style & Readiness for Change Teaching method utilized: Visual & Auditory  Demonstrated degree of understanding via: Teach Back  Barriers to learning/adherence to lifestyle change: None Identified   Progress/ Updates on Pt's Goals .  Drinking more water!  . Working on incorporating balanced meals (been including more vegetables)  . Walking a LOT! Above and beyond goal previously set.  . NEW: Track food and beverage intake at least a couple days per week  . NEW: Pay attention to which foods have carbohydrates in them, start  practicing carb counting  . NEW: Balance meals with carbohydrate and protein foods    MONITORING & EVALUATION Dietary intake, weekly physical activity, and goals in 3 months.  RD's Notes for Next Visit . Balanced Snack Ideas  . Sleep and Stress   Next Steps  Patient is to return to [facility] for follow up visit in 3 months for further nutrition education.

## 2019-09-16 NOTE — Telephone Encounter (Signed)
Patient wrote a Pharmacist, community message in regards to starting Metformin. We had discussed she contact her pharmacy and discuss the recall and she let us know if she wants it called in or not. Patient has decided to take metformin. Are you still okay with metformin 500mg  and how often should she take it? If so, would you like for me to call it in?

## 2019-09-17 MED FILL — metFORMIN HCL 500 MG TABS: 500 | 90 days supply | Qty: 90 | Fill #0

## 2019-09-22 MED FILL — BUPROPION HCL XL 150 MG TAB: 150 | 30 days supply | Qty: 30 | Fill #1

## 2019-09-24 MED FILL — QVAR REDIHALER 80 MCG/ACT A: 80 | 60 days supply | Qty: 11 | Fill #2

## 2019-10-21 ENCOUNTER — Other Ambulatory Visit: Payer: Self-pay | Admitting: Family Medicine

## 2019-10-21 MED FILL — buPROPion HCL ER (XL) 150 M: 150 | 90 days supply | Qty: 90 | Fill #0

## 2019-10-23 MED FILL — FLUoxetine HCL 20 MG CAPS: 20 | 90 days supply | Qty: 90 | Fill #1

## 2019-10-27 MED FILL — DICLOFENAC SODIUM 1 % GEL: 1 | 7 days supply | Qty: 100 | Fill #2

## 2019-11-30 ENCOUNTER — Telehealth: Payer: Self-pay

## 2019-11-30 NOTE — Telephone Encounter (Signed)
Received incoming fax from [employer] for FMLA paperwork.

## 2019-12-01 NOTE — Telephone Encounter (Signed)
Placed on PCP desk to review and sign, if appropriate.  

## 2019-12-03 DIAGNOSIS — Z0279 Encounter for issue of other medical certificate: Secondary | ICD-10-CM

## 2019-12-03 NOTE — Telephone Encounter (Signed)
PCP gave paperwork back signed and completed. Currently on my desk. Left message for pt to see if she would like this faxed or pick up.

## 2019-12-11 MED FILL — metFORMIN HCL 500 MG TABS: 500 | 90 days supply | Qty: 90 | Fill #1

## 2019-12-14 NOTE — Telephone Encounter (Signed)
FMLA has been completed.

## 2019-12-15 ENCOUNTER — Other Ambulatory Visit: Payer: Self-pay

## 2019-12-15 ENCOUNTER — Encounter: Payer: 59 | Attending: Family Medicine | Admitting: Dietician

## 2019-12-15 ENCOUNTER — Encounter: Payer: Self-pay | Admitting: Dietician

## 2019-12-15 DIAGNOSIS — R7303 Prediabetes: Secondary | ICD-10-CM | POA: Diagnosis not present

## 2019-12-15 NOTE — Progress Notes (Signed)
Medical Nutrition Therapy  Follow-Up Visit Appt Start Time: 7:30am  End Time: 8:15am  Patient was originally seen on 06/10/2019 for morbid obesity and weight management for planned epigastric hernia repair, and for follow up on 09/16/2019.   Primary concerns today: prediabetes and intermittent fasting    NUTRITION ASSESSMENT   Clinical Medical Hx: obesity, asthma, thrush, chronic interstitial cystitis, umbilical hernia, colonic polyps, major depressive disorder, prediabetes Medications: see list   Lifestyle & Dietary Hx Patient states she has been practicing intermittent fasting for the past couple of months and likes this type of eating schedule. States she only eats between noon and 6:00pm. States that it helps her not snack throughout the day, because she feels as though if she eats breakfast and "breaks her fast" she is okay with eating at anytime.   Working on including more veggies with meals. States she has done well with drinking more water but would like to continue increasing intake by replacing diet Coke with water. States she tries to keep within 2-4 servings (30-60 grams) of carbs per meal, and if she eats carbs with 1 meal she tries to limit them at the other meal.   Estimated daily fluid intake: 80 oz Supplements: zinc, vitamin C  Stress / self-care: very stressed taking care of husband who is disabled; for self-care, likes to get outside and walk, read, and play games on her phone  Current average weekly physical activity: walking    24-Hr Dietary Recall First Meal: - Snack: - Second Meal: vegetarian chili + crackers  Snack: peanut butter crackers  Third Meal: salad + egg   Snack: -  Beverages: water, Diet Coke, coffee w/ sugar-free creamer   Estimated Energy Needs Calories: 1600 Carbohydrate: 180g Protein: 100g Fat: 53g   NUTRITION DIAGNOSIS  12/15/2019: Intake of types/amounts of carbohydrates inconsistent with needs (NI-5.8.3) related to misconceptions about  nutrition as evidenced by reported dietary intake.   06/10/2019: Excessive oral intake (NI-2.2) related to undesirable food choices as evidenced by pt reported dietary hx of intakes of foods (amounts and types) which exceed estimated energy needs, referral for obesity, and request for dietary/nutrition counseling for weight loss. Status: Resolved   NUTRITION INTERVENTION  Nutrition education (E-1) on the following topics:  Consistent carbohydrate intake and balancing meals/snacks with protein   Handouts Provided Include   Balanced Snacks  Learning Style & Readiness for Change Teaching method utilized: Visual & Auditory  Demonstrated degree of understanding via: Teach Back  Barriers to learning/adherence to lifestyle change: None Identified   Progress/ Updates on Pt's Goals . Drinking more water! Would like to continue replacing Diet Coke with water.  . Working on incorporating balanced meals (been including more vegetables)  . Walking a LOT! . Tracking food and beverage intake at least a couple days per week  . Paying attention to which foods have carbohydrates in them and carb counting (staying between 2-4 servings per meal)  . NEW: Eat 2-4 servings of carbs at BOTH meals, not just 1  . NEW: Balance meals with carbohydrate and protein foods    MONITORING & EVALUATION Dietary intake, weekly physical activity, and goals in 3 months.  Next Steps  Patient is to return to [facility] for follow up visit in 3 months for further nutrition education.

## 2019-12-15 NOTE — Patient Instructions (Signed)
   Stick within 2-4 servings of carbohydrates per meal (between 30 and 60 grams.)    Aim to drink no more than 1 diet Coke per day, and try to have it in the morning.   Continue to work in having more vegetables with meals/snacks. Remember to also always try to include a source of protein with meals/snacks.

## 2020-01-13 MED FILL — buPROPion HCL ER (XL) 150 M: 150 | 90 days supply | Qty: 90 | Fill #1

## 2020-01-22 ENCOUNTER — Other Ambulatory Visit: Payer: Self-pay | Admitting: Family Medicine

## 2020-01-22 MED FILL — FLUoxetine HCL 20 MG CAPS: 20 | 30 days supply | Qty: 30 | Fill #0

## 2020-02-15 ENCOUNTER — Other Ambulatory Visit: Payer: Self-pay | Admitting: Family Medicine

## 2020-03-04 ENCOUNTER — Ambulatory Visit: Payer: 59 | Admitting: Family Medicine

## 2020-03-04 DIAGNOSIS — Z0289 Encounter for other administrative examinations: Secondary | ICD-10-CM

## 2020-03-04 NOTE — Progress Notes (Deleted)
CC:  Patient is a 59 y.o. Caucasian female with a PMH of prediabetes, MDD, GERD, and allergic rhinitis who presents for 6 mo f/u prediabetes.  HPI:  Started metformin last visit due to A1c rising to 6.4%.  A/P of last visit:  "Health maintenance exam: Reviewed age and gender appropriate health maintenance issues (prudent diet, regular exercise, health risks of tobacco and excessive alcohol, use of seatbelts, fire alarms in home, use of sunscreen).  Also reviewed age and gender appropriate health screening as well as vaccine recommendations. Vaccines: flu vaccine->given today.  All other recommended vaccines are UTD, including shingrix. Labs: Has had recent CBC and CMET that were normal.  Has hx of IFG and mild hypercholesterolemia->will check A1c, fasting glucose, , TSH, and FLP. Cervical ca screening: Has GYN, next pap due 04/2021 Breast ca screening: Self reported mammo normal 04/2018.  Due for repeat screening mammo now->she'll call her GYN. Colon ca screening: next colonoscopy due 2022."   PMH: Past Medical History:  Diagnosis Date  . Abdominal wall hernia   . Allergic rhinitis   . Atypical chest pain 08/2012   Normal Myoview 09/2012  . Borderline hyperlipidemia 05/2018; 08/2019   2020 Framingham CV risk 2%  . Contact dermatitis    poison ivy/oak  . Daytime sleepiness   . Epigastric hernia 04/2019   Repaired 06/26/19  . GERD (gastroesophageal reflux disease)   . Hay fever    seasonal  . Hernia   . 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 Interstitial cystitis    Dr. Rosana Hoes: hasn't seen him in yrs or so, was on elmiron and coldn't tell much difference.  . MDD (major depressive disorder)   . Mild persistent asthma 08/31/2013   [place] pulm: stable as of pulm f/u 02/2018 and 05/2019: no med changes made---1 yr f/u recommended.  . Personal history of  adenomatous colonic polyp 10/28/2012   10/2012 - 4x10 mm tubular adenoma; repeat 09/03/16 showed NO POLYPS.  Recall 5 yrs  (08/2021).  . Prediabetes 08/2019   08/20/19 HbA1c 6.4%->recommended metformin at that time.    M/A: Current Outpatient Medications on File Prior to Visit  Medication Sig Dispense Refill  . acetaminophen (TYLENOL) 500 MG tablet Take 1,000 mg by mouth every 6 (six) hours as needed for moderate pain.    . beclomethasone (QVAR REDIHALER) 80 MCG/ACT inhaler INHALE 2 PUFFS INTO THE LUNGS DAILY. (Patient taking differently: Inhale 2 puffs into the lungs daily. ) 10.6 g 11  . buPROPion (WELLBUTRIN XL) 150 MG 24 hr tablet TAKE 1 TABLET BY MOUTH DAILY. 90 tablet 1  . diclofenac sodium (VOLTAREN) 1 % GEL Apply 4 g topically 4 (four) times daily. To affected joint. 100 g 11  . famotidine (PEPCID) 20 MG tablet Take 20 mg by mouth as needed for heartburn or indigestion.    Marland Kitchen FLUoxetine (PROZAC) 20 MG capsule TAKE 1 CAPSULE BY MOUTH ONCE DAILY 30 capsule 0  . metFORMIN (GLUCOPHAGE) 500 MG tablet 1 tab po qd at suppertime 90 tablet 1  . ondansetron (ZOFRAN ODT) 4 MG disintegrating tablet 4mg  ODT q4 hours prn nausea/vomit (Patient not taking: Reported on 08/20/2019) 8 tablet 0  . triamcinolone (NASACORT) 55 MCG/ACT AERO nasal inhaler Place 1 spray into the nose daily as needed (allergies).     . VENTOLIN HFA 108 (90 Base) MCG/ACT inhaler INHALE 2 PUFFS INTO THE LUNGS EVERY 6 HOURS AS NEEDED FOR WHEEZING. (Patient taking differently: Inhale 2 puffs into the lungs every 6 (six) hours as needed  for wheezing or shortness of breath. INHALE 2 PUFFS INTO THE LUNGS EVERY 6 HOURS AS NEEDED FOR WHEEZING.) 18 g 5   No current facility-administered medications on file prior to visit.   Allergies  Allergen Reactions  . Ciprofloxacin Itching    FH: Family History  Problem Relation Age of Onset  . Colon polyps Mother   . Diverticulitis Father   . Other Maternal Uncle        great-stom or colon cancer  . Colon cancer Maternal Uncle   . Rectal cancer Neg Hx     SH: Social History   Socioeconomic History  .  Marital status: Married    Spouse name: Not on file  . Number of children: Not on file  . Years of education: Not on file  . Highest education level: Not on file  Occupational History  . Not on file  Tobacco Use  . Smoking status: Never Smoker  . Smokeless tobacco: Never Used  Substance and Sexual Activity  . Alcohol use: Yes    Comment: rare  . Drug use: No  . Sexual activity: Never    Birth control/protection: None  Other Topics Concern  . Not on file  Social History Narrative   Married, 1 son.   Orig from [place] but has lived in [place] >20 yrs.   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   Exercise: walks 40 min 3X/week.     No T/A/Ds   Social Determinants of Health   Financial Resource Strain:   . Difficulty of Paying Living Expenses:   Food Insecurity:   . Worried About Charity fundraiser in the Last Year:   . Arboriculturist in the Last Year:   Transportation Needs:   . Film/video editor (Medical):   Marland Kitchen Lack of Transportation (Non-Medical):   Physical Activity:   . Days of Exercise per Week:   . Minutes of Exercise per Session:   Stress:   . Feeling of Stress :   Social Connections:   . Frequency of Communication with Friends and Family:   . Frequency of Social Gatherings with Friends and Family:   . Attends Religious Services:   . Active Member of Clubs or Organizations:   . Attends Archivist Meetings:   Marland Kitchen Marital Status:     ROS: ROS  PE: Vitals with BMI 08/20/2019 07/13/2019 06/27/2019  Height 5' 4.5" - -  Weight 246 lbs 3 oz 254 lbs -  BMI [id number] -  Systolic [id number]  Diastolic 76 66 51  Pulse 68 68 81    Physical Exam  Labs: No results found for this or any previous visit (from the past 2160 hour(s)).   A/P: In summary, Carlesha is a 59 year old woman with a past medical history of prediabetes, MDD, allergic rhinitis, and GERD who presents today for a 6 month follow up on  starting metformin for prediabetes. On physical exam, PE. We reviewed age and gender appropriate health maintenance issues (prudent diet, regular exercise, health risks of tobacco and excessive alcohol, use of seatbelts, fire alarms in home, use of sunscreen). Also reviewed age and gender appropriate health screening as well as vaccine recommendations. My plan is  Labs: check a CMET and A1C (b/c recently started metformin and want to check renal and hepatic function for consideration of statin)  Vaccines: Tdap, Shingrix, Flu UTD. Cervical ca screening: Has GYN, next pap  due 04/2021 Breast ca screening: Self reported mammo normal 04/2018.  Due for repeat screening mammo now->she'll call her GYN. Colon ca screening: next colonoscopy due 2022.   Lab Orders  No laboratory test(s) ordered today     Signed: Bennie Dallas, MS3 04 March 2020

## 2020-03-04 NOTE — Progress Notes (Deleted)
OFFICE VISIT  03/04/2020   CC: No chief complaint on file.    HPI:    Patient is a 59 y.o. Caucasian female who presents for 6 mo f/u prediabetes. Started metformin last visit due to A1c rising to 6.4%.  Interim hx: ***  Past Medical History:  Diagnosis Date  . Abdominal wall hernia   . Allergic rhinitis   . Atypical chest pain 08/2012   Normal Myoview 09/2012  . Borderline hyperlipidemia 05/2018; 08/2019   2020 Framingham CV risk 2%  . Contact dermatitis    poison ivy/oak  . Daytime sleepiness   . Epigastric hernia 04/2019   Repaired 06/26/19  . GERD (gastroesophageal reflux disease)   . Hay fever    seasonal  . Hernia   . 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 Interstitial cystitis    Dr. Rosana Hoes: hasn't seen him in yrs or so, was on elmiron and coldn't tell much difference.  . MDD (major depressive disorder)   . Mild persistent asthma 08/31/2013   [facility] pulm: stable as of pulm f/u 02/2018 and 05/2019: no med changes made---1 yr f/u recommended.  . Personal history of  adenomatous colonic polyp 10/28/2012   10/2012 - 4x10 mm tubular adenoma; repeat 09/03/16 showed NO POLYPS.  Recall 5 yrs (08/2021).  . Prediabetes 08/2019   08/20/19 HbA1c 6.4%->recommended metformin at that time.    Past Surgical History:  Procedure Laterality Date  . CARDIOVASCULAR STRESS TEST  09/2012   Rest/Stress myoview: normal  . CESAREAN SECTION  1988  . CHOLECYSTECTOMY  2002  . COLONOSCOPY W/ POLYPECTOMY  10/22/12; 09/03/16   2013; tubular adenoma, no high grade dysplasia (Dr. Carlean Purl).  Repeat 08/2016: no polyps.  Recall 5 yrs (08/2021--Dr. Carlean Purl).  . CYSTOSCOPY     with bladder bx 07/22/18-->  . EPIGASTRIC HERNIA REPAIR N/A 06/26/2019   Procedure: LAPAROSCOPIC EPIGASTRIC HERNIA REPAIR WITH MESH;  Surgeon: Alphonsa Overall, MD;  Location: [facility];  Service: General;  Laterality: N/A;  . HERNIA REPAIR  [id number]   umbilical, during lap chole  . TONSILLECTOMY AND ADENOIDECTOMY  1974  .  UMBILICAL HERNIA REPAIR  08/19/2012   Procedure: LAPAROSCOPIC UMBILICAL HERNIA;  Surgeon: Shann Medal, MD;  Location: [facility];  Service: General;  Laterality: N/A;    Outpatient Medications Prior to Visit  Medication Sig Dispense Refill  . acetaminophen (TYLENOL) 500 MG tablet Take 1,000 mg by mouth every 6 (six) hours as needed for moderate pain.    . beclomethasone (QVAR REDIHALER) 80 MCG/ACT inhaler INHALE 2 PUFFS INTO THE LUNGS DAILY. (Patient taking differently: Inhale 2 puffs into the lungs daily. ) 10.6 g 11  . buPROPion (WELLBUTRIN XL) 150 MG 24 hr tablet TAKE 1 TABLET BY MOUTH DAILY. 90 tablet 1  . diclofenac sodium (VOLTAREN) 1 % GEL Apply 4 g topically 4 (four) times daily. To affected joint. 100 g 11  . famotidine (PEPCID) 20 MG tablet Take 20 mg by mouth as needed for heartburn or indigestion.    Marland Kitchen FLUoxetine (PROZAC) 20 MG capsule TAKE 1 CAPSULE BY MOUTH ONCE DAILY 30 capsule 0  . metFORMIN (GLUCOPHAGE) 500 MG tablet 1 tab po qd at suppertime 90 tablet 1  . ondansetron (ZOFRAN ODT) 4 MG disintegrating tablet 4mg  ODT q4 hours prn nausea/vomit (Patient not taking: Reported on 08/20/2019) 8 tablet 0  . triamcinolone (NASACORT) 55 MCG/ACT AERO nasal inhaler Place 1 spray into the nose daily as needed (allergies).     . VENTOLIN HFA 108 (90 Base)  MCG/ACT inhaler INHALE 2 PUFFS INTO THE LUNGS EVERY 6 HOURS AS NEEDED FOR WHEEZING. (Patient taking differently: Inhale 2 puffs into the lungs every 6 (six) hours as needed for wheezing or shortness of breath. INHALE 2 PUFFS INTO THE LUNGS EVERY 6 HOURS AS NEEDED FOR WHEEZING.) 18 g 5   No facility-administered medications prior to visit.    Allergies  Allergen Reactions  . Ciprofloxacin Itching    ROS As per HPI  PE: There were no vitals taken for this visit. ***  LABS:  Lab Results  Component Value Date   HGBA1C 6.4 08/20/2019     Chemistry      Component Value Date/Time   NA 139 04/26/2019 1133   K 3.6 04/26/2019 1133    CL 107 04/26/2019 1133   CO2 22 04/26/2019 1133   BUN 8 04/26/2019 1133   CREATININE 0.81 04/26/2019 1133      Component Value Date/Time   CALCIUM 8.9 04/26/2019 1133   ALKPHOS 55 04/26/2019 1133   AST 22 04/26/2019 1133   ALT 24 04/26/2019 1133   BILITOT 0.4 04/26/2019 1133     Lab Results  Component Value Date   CHOL 206 (H) 08/20/2019   HDL 58.70 08/20/2019   LDLCALC 126 (H) 08/20/2019   LDLDIRECT 159.8 09/25/2012   TRIG 107.0 08/20/2019   CHOLHDL 4 08/20/2019    IMPRESSION AND PLAN:  No problem-specific Assessment & Plan notes found for this encounter.   An After Visit Summary was printed and given to the patient.  FOLLOW UP: No follow-ups on file.  Signed:  Crissie Sickles, MD           03/04/2020

## 2020-03-09 ENCOUNTER — Other Ambulatory Visit: Payer: Self-pay | Admitting: Family Medicine

## 2020-03-09 MED FILL — METFORMIN HCL 500 MG TABS: 500 | 30 days supply | Qty: 30 | Fill #0

## 2020-03-11 ENCOUNTER — Other Ambulatory Visit: Payer: Self-pay | Admitting: Family Medicine

## 2020-03-14 ENCOUNTER — Other Ambulatory Visit: Payer: Self-pay | Admitting: Family Medicine

## 2020-03-16 ENCOUNTER — Ambulatory Visit: Payer: 59 | Admitting: Dietician

## 2020-03-30 ENCOUNTER — Other Ambulatory Visit: Payer: Self-pay | Admitting: Family Medicine

## 2020-03-31 MED FILL — FLUoxetine HCL 20 MG CAPS: 20 | 30 days supply | Qty: 30 | Fill #0

## 2020-04-02 ENCOUNTER — Other Ambulatory Visit: Payer: Self-pay | Admitting: Family Medicine

## 2020-04-02 MED FILL — METFORMIN HCL 500 MG TABS: 500 | 30 days supply | Qty: 30 | Fill #0

## 2020-04-12 ENCOUNTER — Other Ambulatory Visit: Payer: Self-pay | Admitting: Family Medicine

## 2020-04-12 MED ORDER — BUPROPION HCL ER (XL) 150 MG PO TB24: 150.0000 mg | ORAL_TABLET | Freq: Every day | ORAL | 0 refills | Status: DC

## 2020-04-12 MED FILL — buPROPion HCL ER (XL) 150 M: 150 | 90 days supply | Qty: 90 | Fill #0

## 2020-04-12 NOTE — Telephone Encounter (Signed)
RF request for bupropion 150 mg.  Contacted patient and told her I wasn't able to fill RX unless she scheduled as she No Showed her last 6 month follow up.  She scheduled 04/14/20 @ 8am.  RX sent for # 90 per patient request.  No refills given.

## 2020-04-14 ENCOUNTER — Ambulatory Visit: Payer: 59 | Admitting: Family Medicine

## 2020-04-14 ENCOUNTER — Encounter: Payer: Self-pay | Admitting: Family Medicine

## 2020-04-14 ENCOUNTER — Other Ambulatory Visit: Payer: Self-pay

## 2020-04-14 VITALS — BP 108/75 | HR 75 | Temp 97.8°F | Resp 16 | Ht 64.5 in | Wt 221.8 lb

## 2020-04-14 DIAGNOSIS — R7303 Prediabetes: Secondary | ICD-10-CM

## 2020-04-14 DIAGNOSIS — G4719 Other hypersomnia: Secondary | ICD-10-CM

## 2020-04-14 DIAGNOSIS — G4733 Obstructive sleep apnea (adult) (pediatric): Secondary | ICD-10-CM | POA: Diagnosis not present

## 2020-04-14 DIAGNOSIS — F330 Major depressive disorder, recurrent, mild: Secondary | ICD-10-CM | POA: Diagnosis not present

## 2020-04-14 DIAGNOSIS — E7800 Pure hypercholesterolemia, unspecified: Secondary | ICD-10-CM | POA: Diagnosis not present

## 2020-04-14 DIAGNOSIS — F411 Generalized anxiety disorder: Secondary | ICD-10-CM | POA: Diagnosis not present

## 2020-04-14 LAB — COMPREHENSIVE METABOLIC PANEL
ALT: 26 U/L (ref 0–35)
AST: 20 U/L (ref 0–37)
Albumin: 4.2 g/dL (ref 3.5–5.2)
Alkaline Phosphatase: 60 U/L (ref 39–117)
BUN: 18 mg/dL (ref 6–23)
CO2: 29 mEq/L (ref 19–32)
Calcium: 9.3 mg/dL (ref 8.4–10.5)
Chloride: 103 mEq/L (ref 96–112)
Creatinine, Ser: 0.68 mg/dL (ref 0.40–1.20)
GFR: 88.8 mL/min (ref >60.00)
Glucose, Bld: 94 mg/dL (ref 70–99)
Potassium: 4.4 mEq/L (ref 3.5–5.1)
Sodium: 139 mEq/L (ref 135–145)
Total Bilirubin: 0.5 mg/dL (ref 0.2–1.2)
Total Protein: 7.1 g/dL (ref 6.0–8.3)

## 2020-04-14 LAB — CBC WITH DIFFERENTIAL/PLATELET
Basophils Absolute: 0 10*3/uL (ref 0.0–0.1)
Basophils Relative: 0.8 % (ref 0.0–3.0)
Eosinophils Absolute: 0.1 10*3/uL (ref 0.0–0.7)
Eosinophils Relative: 2.4 % (ref 0.0–5.0)
HCT: 40.5 % (ref 36.0–46.0)
Hemoglobin: 13.5 g/dL (ref 12.0–15.0)
Lymphocytes Relative: 21.9 % (ref 12.0–46.0)
Lymphs Abs: 1 10*3/uL (ref 0.7–4.0)
MCHC: 33.2 g/dL (ref 30.0–36.0)
MCV: 88.8 fl (ref 78.0–100.0)
Monocytes Absolute: 0.4 10*3/uL (ref 0.1–1.0)
Monocytes Relative: 8.3 % (ref 3.0–12.0)
Neutro Abs: 3.1 10*3/uL (ref 1.4–7.7)
Neutrophils Relative %: 66.6 % (ref 43.0–77.0)
Platelets: 228 10*3/uL (ref 150.0–400.0)
RBC: 4.56 Mil/uL (ref 3.87–5.11)
RDW: 13.5 % (ref 11.5–15.5)
WBC: 4.6 10*3/uL (ref 4.0–10.5)

## 2020-04-14 LAB — HEMOGLOBIN A1C: Hgb A1c MFr Bld: 6.3 % (ref 4.6–6.5)

## 2020-04-14 LAB — LIPID PANEL
Cholesterol: 228 mg/dL — ABNORMAL HIGH (ref 0–200)
HDL: 65 mg/dL (ref >39.00)
LDL Cholesterol: 145 mg/dL — ABNORMAL HIGH (ref 0–99)
NonHDL: 163.39
Total CHOL/HDL Ratio: 4
Triglycerides: 94 mg/dL (ref 0.0–149.0)
VLDL: 18.8 mg/dL (ref 0.0–40.0)

## 2020-04-14 NOTE — Progress Notes (Signed)
OFFICE VISIT  04/14/2020   CC:  Chief Complaint  Patient presents with  . Follow-up    RCI, pt is fasting   HPI:    Patient is a 59 y.o. Caucasian female who presents for 6 mo f/u prediabetes, MDD/GAD, HLD.  Last visit A1c was up to 6.4% and I started her on metformin. Eating much smarter, more activity, has lost 25 lbs in last 6 mo.  Feeling more energy.   HLD: she has not been high enough cV risk to warrant statin yet. See above regarding TLC changes.  Anx/dep: sleepy in day so she changed her prozac and wellbutrin to hs about 1-2 wks ago, no change in excessive daytime somnolence.  Gets 6-7 hours of sleep per night to help sick husband or use restroom. Up anywhere from 5-15 min at a time.  Unknown if she snores, son says no.  No waking up gasping.  NO witnessed apneic events in sleep.  Has nighttime RLS that impairs sleep initiation at times, about 20% of time.  RLS seems to be worse after INCREASED activity.  Unclear if worse after getting on wellbutrin. Seems to have become any issue after her hernia surgery back in 06/2019.  ROS: no fevers, no CP, no SOB, no wheezing, no cough, no dizziness, no HAs, no rashes, no melena/hematochezia.  No polyuria or polydipsia.  No myalgias or arthralgias.  No focal weakness, paresthesias, or tremors.  No acute vision or hearing abnormalities. No n/v/d or abd pain.  No palpitations.    Past Medical History:  Diagnosis Date  . Abdominal wall hernia   . Allergic rhinitis   . Atypical chest pain 08/2012   Normal Myoview 09/2012  . Borderline hyperlipidemia 05/2018; 08/2019   2020 Framingham CV risk 2%  . Contact dermatitis    poison ivy/oak  . Daytime sleepiness   . Epigastric hernia 04/2019   Repaired 06/26/19  . GERD (gastroesophageal reflux disease)   . Hay fever    seasonal  . Hernia   . 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 Interstitial cystitis    Dr. Rosana Hoes: hasn't seen him in yrs or so, was on elmiron and coldn't tell much  difference.  . MDD (major depressive disorder)   . Mild persistent asthma 08/31/2013   [facility] pulm: stable as of pulm f/u 02/2018 and 05/2019: no med changes made---1 yr f/u recommended.  . Personal history of  adenomatous colonic polyp 10/28/2012   10/2012 - 4x10 mm tubular adenoma; repeat 09/03/16 showed NO POLYPS.  Recall 5 yrs (08/2021).  . Prediabetes 08/2019   08/20/19 HbA1c 6.4%->recommended metformin at that time.    Past Surgical History:  Procedure Laterality Date  . CARDIOVASCULAR STRESS TEST  09/2012   Rest/Stress myoview: normal  . CESAREAN SECTION  1988  . CHOLECYSTECTOMY  2002  . COLONOSCOPY W/ POLYPECTOMY  10/22/12; 09/03/16   2013; tubular adenoma, no high grade dysplasia (Dr. Carlean Purl).  Repeat 08/2016: no polyps.  Recall 5 yrs (08/2021--Dr. Carlean Purl).  . CYSTOSCOPY     with bladder bx 07/22/18-->  . EPIGASTRIC HERNIA REPAIR N/A 06/26/2019   Procedure: LAPAROSCOPIC EPIGASTRIC HERNIA REPAIR WITH MESH;  Surgeon: Alphonsa Overall, MD;  Location: [facility];  Service: General;  Laterality: N/A;  . HERNIA REPAIR  [id number]   umbilical, during lap chole  . TONSILLECTOMY AND ADENOIDECTOMY  1974  . UMBILICAL HERNIA REPAIR  08/19/2012   Procedure: LAPAROSCOPIC UMBILICAL HERNIA;  Surgeon: Shann Medal, MD;  Location: [facility];  Service: General;  Laterality: N/A;    Outpatient Medications Prior to Visit  Medication Sig Dispense Refill  . beclomethasone (QVAR REDIHALER) 80 MCG/ACT inhaler INHALE 2 PUFFS INTO THE LUNGS DAILY. (Patient taking differently: Inhale 2 puffs into the lungs daily. ) 10.6 g 11  . buPROPion (WELLBUTRIN XL) 150 MG 24 hr tablet Take 1 tablet (150 mg total) by mouth daily. 90 tablet 0  . famotidine (PEPCID) 20 MG tablet Take 20 mg by mouth as needed for heartburn or indigestion.    Marland Kitchen FLUoxetine (PROZAC) 20 MG capsule TAKE 1 CAPSULE BY MOUTH ONCE DAILY 30 capsule 0  . metFORMIN (GLUCOPHAGE) 500 MG tablet TAKE 1 TABLET BY MOUTH ONCE DAILY AT SUPPERTIME (APPT NEEDED) 30  tablet 0  . acetaminophen (TYLENOL) 500 MG tablet Take 1,000 mg by mouth every 6 (six) hours as needed for moderate pain.    Marland Kitchen diclofenac sodium (VOLTAREN) 1 % GEL Apply 4 g topically 4 (four) times daily. To affected joint. (Patient not taking: Reported on 04/14/2020) 100 g 11  . ondansetron (ZOFRAN ODT) 4 MG disintegrating tablet 4mg  ODT q4 hours prn nausea/vomit (Patient not taking: Reported on 08/20/2019) 8 tablet 0  . triamcinolone (NASACORT) 55 MCG/ACT AERO nasal inhaler Place 1 spray into the nose daily as needed (allergies).     . VENTOLIN HFA 108 (90 Base) MCG/ACT inhaler INHALE 2 PUFFS INTO THE LUNGS EVERY 6 HOURS AS NEEDED FOR WHEEZING. (Patient not taking: Reported on 04/14/2020) 18 g 5   No facility-administered medications prior to visit.    Allergies  Allergen Reactions  . Ciprofloxacin Itching    ROS As per HPI  PE: Vitals with BMI 04/14/2020 08/20/2019 07/13/2019  Height 5' 4.5" 5' 4.5" -  Weight 221 lbs 13 oz 246 lbs 3 oz 254 lbs  BMI 37.5 Q000111Q 99991111  Systolic [id number]  Diastolic 75 76 66  Pulse 75 68 68    Gen: Alert, well appearing.  Patient is oriented to person, place, time, and situation. AFFECT: pleasant, lucid thought and speech. CV: RRR, no m/r/g.   LUNGS: CTA bilat, nonlabored resps, good aeration in all lung fields.  Quality of sleep questionnaire today= 7 (High risk for OSA)  LABS:  Lab Results  Component Value Date   TSH 1.70 08/20/2019   Lab Results  Component Value Date   WBC 6.6 06/23/2019   HGB 13.0 06/23/2019   HCT 42.7 06/23/2019   MCV 93.0 06/23/2019   PLT 249 06/23/2019   Lab Results  Component Value Date   CREATININE 0.81 04/26/2019   BUN 8 04/26/2019   NA 139 04/26/2019   K 3.6 04/26/2019   CL 107 04/26/2019   CO2 22 04/26/2019   Lab Results  Component Value Date   ALT 24 04/26/2019   AST 22 04/26/2019   ALKPHOS 55 04/26/2019   BILITOT 0.4 04/26/2019   Lab Results  Component Value Date   CHOL 206 (H) 08/20/2019    Lab Results  Component Value Date   HDL 58.70 08/20/2019   Lab Results  Component Value Date   LDLCALC 126 (H) 08/20/2019   Lab Results  Component Value Date   TRIG 107.0 08/20/2019   Lab Results  Component Value Date   CHOLHDL 4 08/20/2019   Lab Results  Component Value Date   HGBA1C 6.4 08/20/2019    IMPRESSION AND PLAN:  1) Prediabetes and hypercholesterolemia and obesity: great TLC and wt loss over the last year. Congratulated pt, encouraged her to keep up  the great work. Repeat A1c and FLP today as well as CMET and CBC.  2) GAD/MDD: stable/improved. No change in fluoxetine or bupropion at this time.  3) Excessive daytime sleepiness: ? Snoring, ? Apneic events in sleep. REfer to [facility]il[facility] neuro for consideration of eval for OSA.  An After Visit Summary was printed and given to the patient.  FOLLOW UP: Return in about 6 months (around 10/14/2020) for annual CPE (fasting).  Signed:  Crissie Sickles, MD           04/14/2020

## 2020-04-25 ENCOUNTER — Other Ambulatory Visit: Payer: Self-pay | Admitting: Family Medicine

## 2020-04-25 MED FILL — FLUoxetine HCL 20 MG CAPS: 20 | 90 days supply | Qty: 90 | Fill #0

## 2020-04-27 MED FILL — METFORMIN HCL 500 MG TABS: 500 | 30 days supply | Qty: 30 | Fill #0

## 2020-05-09 ENCOUNTER — Encounter: Payer: Self-pay | Admitting: Neurology

## 2020-05-09 ENCOUNTER — Ambulatory Visit (INDEPENDENT_AMBULATORY_CARE_PROVIDER_SITE_OTHER): Payer: 59 | Admitting: Neurology

## 2020-05-09 VITALS — BP 112/77 | HR 65 | Ht 65.0 in | Wt 224.3 lb

## 2020-05-09 DIAGNOSIS — R351 Nocturia: Secondary | ICD-10-CM

## 2020-05-09 DIAGNOSIS — R519 Headache, unspecified: Secondary | ICD-10-CM

## 2020-05-09 DIAGNOSIS — G2581 Restless legs syndrome: Secondary | ICD-10-CM

## 2020-05-09 DIAGNOSIS — E669 Obesity, unspecified: Secondary | ICD-10-CM

## 2020-05-09 DIAGNOSIS — G4719 Other hypersomnia: Secondary | ICD-10-CM | POA: Diagnosis not present

## 2020-05-09 DIAGNOSIS — Z820 Family history of epilepsy and other diseases of the nervous system: Secondary | ICD-10-CM | POA: Diagnosis not present

## 2020-05-09 NOTE — Patient Instructions (Signed)

## 2020-05-09 NOTE — Progress Notes (Signed)
Subjective:    Patient ID: Donna Willis is a 59 y.o. female.  HPI     Star Age, MD, PhD [facility] [address] [place], [place] [address]  Dear Dr. Anitra Lauth,   I saw your patient, Donna Willis, upon your kind request in my sleep clinic today for initial consultation of her sleep disorder, in particular, concern for underlying obstructive sleep apnea.  The patient is unaccompanied today.  As you know, Donna Willis is a 59 year old right-handed woman with an underlying medical history of interstitial cystitis, prediabetes, reflux disease, depression, asthma, allergic rhinitis, histoplasmosis, and obesity, who excessive daytime somnolence, for the past several months, particularly worse over the past 2 to 3 months.  She has dozed off while working at the computer, she has also come close to falling asleep at the wheel.  She reports a family history of sleep apnea affecting her father and 2 brothers.  She does not know if she snores, her son has not mentioned it.  Her husband had reported it in the past.  She has been working on weight loss.  She has lost about 40 pounds in the past several months.  She takes care of her husband who is disabled and her son helps take care of him at night.  She lives with her husband and her son.  She works as a Marine scientist for [employer], and incident reporting.  She is a non-smoker, drinks alcohol rarely, maybe a few times a year, and drinks caffeine in the form of coffee, 1 cup in the mornings about 3 days out of the week and 2 diet sodas per day on average.  She has nocturia about 2-3, sometimes as many as 5 times per night on average.  She has woken up with a headache at times.  She does have intermittent restless legs type symptoms.  I reviewed your office note from 04/14/2020.  Her Epworth sleepiness score is 15 out of 24, fatigue severity score is 35 out of 63.  Bedtime is generally between 10 and 11 and rise time between 5  and 6.  She had a tonsillectomy and adenoidectomy around age 37 or 25.  She has 1 cat in the household.  She has a TV on in her bedroom typically all night.  She avoids caffeine after 2 or 3 PM.  Her Past Medical History Is Significant For: Past Medical History:  Diagnosis Date  . Abdominal wall hernia   . Allergic rhinitis   . Atypical chest pain 08/2012   Normal Myoview 09/2012  . Borderline hyperlipidemia 05/2018; 08/2019   2020 Framingham CV risk 2%  . Contact dermatitis    poison ivy/oak  . Daytime sleepiness   . Epigastric hernia 04/2019   Repaired 06/26/19  . GERD (gastroesophageal reflux disease)   . Hay fever    seasonal  . Hernia   . 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 Interstitial cystitis    Dr. Rosana Hoes: hasn't seen him in yrs or so, was on elmiron and coldn't tell much difference.  . MDD (major depressive disorder)   . Mild persistent asthma 08/31/2013   [facility] pulm: stable as of pulm f/u 02/2018 and 05/2019: no med changes made---1 yr f/u recommended.  . Personal history of  adenomatous colonic polyp 10/28/2012   10/2012 - 4x10 mm tubular adenoma; repeat 09/03/16 showed NO POLYPS.  Recall 5 yrs (08/2021).  . Prediabetes 08/2019   08/20/19 HbA1c 6.4%->recommended metformin at that  time.    Her Past Surgical History Is Significant For: Past Surgical History:  Procedure Laterality Date  . CARDIOVASCULAR STRESS TEST  09/2012   Rest/Stress myoview: normal  . CESAREAN SECTION  1988  . CHOLECYSTECTOMY  2002  . COLONOSCOPY W/ POLYPECTOMY  10/22/12; 09/03/16   2013; tubular adenoma, no high grade dysplasia (Dr. Carlean Purl).  Repeat 08/2016: no polyps.  Recall 5 yrs (08/2021--Dr. Carlean Purl).  . CYSTOSCOPY     with bladder bx 07/22/18-->  . EPIGASTRIC HERNIA REPAIR N/A 06/26/2019   Procedure: LAPAROSCOPIC EPIGASTRIC HERNIA REPAIR WITH MESH;  Surgeon: Alphonsa Overall, MD;  Location: [facility];  Service: General;  Laterality: N/A;  . HERNIA REPAIR  1610   umbilical, during lap chole   . TONSILLECTOMY AND ADENOIDECTOMY  1974  . UMBILICAL HERNIA REPAIR  08/19/2012   Procedure: LAPAROSCOPIC UMBILICAL HERNIA;  Surgeon: Shann Medal, MD;  Location: [facility];  Service: General;  Laterality: N/A;    Her Family History Is Significant For: Family History  Problem Relation Age of Onset  . Colon polyps Mother   . Diverticulitis Father   . Other Maternal Uncle        great-stom or colon cancer  . Colon cancer Maternal Uncle   . Rectal cancer Neg Hx     Her Social History Is Significant For: Social History   Socioeconomic History  . Marital status: Married    Spouse name: Not on file  . Number of children: Not on file  . Years of education: Not on file  . Highest education level: Not on file  Occupational History  . Not on file  Tobacco Use  . Smoking status: Never Smoker  . Smokeless tobacco: Never Used  Vaping Use  . Vaping Use: Never used  Substance and Sexual Activity  . Alcohol use: Yes    Comment: rare  . Drug use: No  . Sexual activity: Never    Birth control/protection: None  Other Topics Concern  . Not on file  Social History Narrative   Married, 1 son.   Orig from [place] but has lived in [place] >20 yrs.   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   Exercise: walks 40 min 3X/week.     No T/A/Ds   Social Determinants of Health   Financial Resource Strain:   . Difficulty of Paying Living Expenses:   Food Insecurity:   . Worried About Charity fundraiser in the Last Year:   . Arboriculturist in the Last Year:   Transportation Needs:   . Film/video editor (Medical):   Marland Kitchen Lack of Transportation (Non-Medical):   Physical Activity:   . Days of Exercise per Week:   . Minutes of Exercise per Session:   Stress:   . Feeling of Stress :   Social Connections:   . Frequency of Communication with Friends and Family:   . Frequency of Social Gatherings with Friends and Family:   . Attends Religious  Services:   . Active Member of Clubs or Organizations:   . Attends Archivist Meetings:   Marland Kitchen Marital Status:     Her Allergies Are:  Allergies  Allergen Reactions  . Ciprofloxacin Itching  :   Her Current Medications Are:  Outpatient Encounter Medications as of 05/09/2020  Medication Sig  . acetaminophen (TYLENOL) 500 MG tablet Take 1,000 mg by mouth every 6 (six) hours as needed for moderate pain.  Marland Kitchen  beclomethasone (QVAR REDIHALER) 80 MCG/ACT inhaler INHALE 2 PUFFS INTO THE LUNGS DAILY. (Patient taking differently: Inhale 2 puffs into the lungs daily. )  . buPROPion (WELLBUTRIN XL) 150 MG 24 hr tablet Take 1 tablet (150 mg total) by mouth daily.  . diclofenac sodium (VOLTAREN) 1 % GEL Apply 4 g topically 4 (four) times daily. To affected joint.  . famotidine (PEPCID) 20 MG tablet Take 20 mg by mouth as needed for heartburn or indigestion.  Marland Kitchen FLUoxetine (PROZAC) 20 MG capsule TAKE 1 CAPSULE BY MOUTH ONCE DAILY. **APPOINTMENT NEEDED**  . metFORMIN (GLUCOPHAGE) 500 MG tablet TAKE 1 TABLET BY MOUTH ONCE DAILY AT SUPPERTIME (APPT NEEDED)  . ondansetron (ZOFRAN ODT) 4 MG disintegrating tablet 4mg  ODT q4 hours prn nausea/vomit  . triamcinolone (NASACORT) 55 MCG/ACT AERO nasal inhaler Place 1 spray into the nose daily as needed (allergies).   . VENTOLIN HFA 108 (90 Base) MCG/ACT inhaler INHALE 2 PUFFS INTO THE LUNGS EVERY 6 HOURS AS NEEDED FOR WHEEZING.   No facility-administered encounter medications on file as of 05/09/2020.  :  Review of Systems:  Out of a complete 14 point review of systems, all are reviewed and negative with the exception of these symptoms as listed below: Review of Systems  Neurological:       Here for sleep consult.  No prior sleep study, report daytime sleepiness/fatigue are present. Reports she does not snore.  Epworth Sleepiness Scale 0= would never doze 1= slight chance of dozing 2= moderate chance of dozing 3= high chance of dozing  Sitting and  reading:2 Watching TV:3 Sitting inactive in a public place (ex. Theater or meeting):0 As a passenger in a car for an hour without a break:2 Lying down to rest in the afternoon:3 Sitting and talking to someone:1 Sitting quietly after lunch (no alcohol):3 In a car, while stopped in traffic:1 Total:15     Objective:  Neurological Exam  Physical Exam Physical Examination:   Vitals:   05/09/20 0758  BP: 112/77  Pulse: 65    General Examination: The patient is a very pleasant 59 y.o. female in no acute distress. She appears well-developed and well-nourished and well groomed.   HEENT: Normocephalic, atraumatic, pupils are equal, round and reactive to light, extraocular tracking is good without limitation to gaze excursion or nystagmus noted. Hearing is grossly intact. Face is symmetric with normal facial animation. Speech is clear with no dysarthria noted. There is no hypophonia. There is no lip, neck/head, jaw or voice tremor. Neck is supple with full range of passive and active motion. There are no carotid bruits on auscultation. Oropharynx exam reveals: mild mouth dryness, adequate dental hygiene and mild airway crowding, due to smaller airway entry and redundant soft palate, uvula not fully visualized. Mallampati is class III. Tongue protrudes centrally and palate elevates symmetrically. Tonsils are absent. Neck size is 17 inches. She has a Mild overbite.   Chest: Clear to auscultation without wheezing, rhonchi or crackles noted.  Heart: S1+S2+0, regular and normal without murmurs, rubs or gallops noted.   Abdomen: Soft, non-tender and non-distended with normal bowel sounds appreciated on auscultation.  Extremities: There is no pitting edema in the distal lower extremities bilaterally.   Skin: Warm and dry without trophic changes noted. Scare from a cat bite L shin area.  Musculoskeletal: exam reveals no obvious joint deformities, tenderness or joint swelling or erythema.    Neurologically:  Mental status: The patient is awake, alert and oriented in all 4 spheres. Her immediate and  remote memory, attention, language skills and fund of knowledge are appropriate. There is no evidence of aphasia, agnosia, apraxia or anomia. Speech is clear with normal prosody and enunciation. Thought process is linear. Mood is normal and affect is normal.  Cranial nerves II - XII are as described above under HEENT exam.  Motor exam: Normal bulk, strength and tone is noted. There is no tremor, Romberg is negative. Fine motor skills and coordination: grossly intact.  Cerebellar testing: No dysmetria or intention tremor. There is no truncal or gait ataxia.  Sensory exam: intact to light touch in the upper and lower extremities.  Gait, station and balance: She stands easily. No veering to one side is noted. No leaning to one side is noted. Posture is age-appropriate and stance is narrow based. Gait shows normal stride length and normal pace. No problems turning are noted. Tandem walk is unremarkable.                Assessment and Plan:  In summary, KATHI DOHN is a very pleasant 59 y.o.-year old female with an underlying medical history of interstitial cystitis, prediabetes, reflux disease, depression, asthma, allergic rhinitis, histoplasmosis, and obesity, whose history and physical exam are concerning for obstructive sleep apnea (OSA). I had a long chat with the patient about my findings and the diagnosis of OSA, its prognosis and treatment options. We talked about medical treatments, surgical interventions and non-pharmacological approaches. I explained in particular the risks and ramifications of untreated moderate to severe OSA, especially with respect to developing cardiovascular disease down the Road, including congestive heart failure, difficult to treat hypertension, cardiac arrhythmias, or stroke. Even type 2 diabetes has, in part, been linked to untreated OSA. Symptoms of untreated  OSA include daytime sleepiness, memory problems, mood irritability and mood disorder such as depression and anxiety, lack of energy, as well as recurrent headaches, especially morning headaches. We talked about trying to maintain a healthy lifestyle in general, as well as the importance of weight control. We also talked about the importance of good sleep hygiene. I recommended the following at this time: sleep study.  I explained the sleep test procedure to the patient and also outlined possible surgical and non-surgical treatment options of OSA, including the use of a custom-made dental device (which would require a referral to a specialist dentist or oral surgeon), upper airway surgical options, such as traditional UPPP or a novel less invasive surgical option in the form of Inspire hypoglossal nerve stimulation (which would involve a referral to an ENT surgeon). I also explained the CPAP treatment option to the patient, who indicated that she would be willing to try CPAP if the need arises. I explained the importance of being compliant with PAP treatment, not only for insurance purposes but primarily to improve Her symptoms, and for the patient's long term health benefit, including to reduce Her cardiovascular risks. I answered all her questions today and the patient was in agreement. I plan to see her back after the sleep study is completed and encouraged her to call with any interim questions, concerns, problems or updates.   Thank you very much for allowing me to participate in the care of this nice patient. If I can be of any further assistance to you please do not hesitate to call me at [phone].  Sincerely,   Star Age, MD, PhD

## 2020-05-21 ENCOUNTER — Other Ambulatory Visit: Payer: Self-pay | Admitting: Family Medicine

## 2020-05-23 ENCOUNTER — Other Ambulatory Visit: Payer: Self-pay | Admitting: Family Medicine

## 2020-05-23 MED FILL — METFORMIN HCL 500 MG TABS: 500 | 30 days supply | Qty: 30 | Fill #0

## 2020-05-27 ENCOUNTER — Other Ambulatory Visit: Payer: Self-pay

## 2020-05-27 ENCOUNTER — Ambulatory Visit (INDEPENDENT_AMBULATORY_CARE_PROVIDER_SITE_OTHER): Payer: 59 | Admitting: Neurology

## 2020-05-27 ENCOUNTER — Telehealth: Payer: Self-pay | Admitting: Neurology

## 2020-05-27 DIAGNOSIS — G4720 Circadian rhythm sleep disorder, unspecified type: Secondary | ICD-10-CM

## 2020-05-27 DIAGNOSIS — E669 Obesity, unspecified: Secondary | ICD-10-CM

## 2020-05-27 DIAGNOSIS — G4719 Other hypersomnia: Secondary | ICD-10-CM

## 2020-05-27 DIAGNOSIS — Z820 Family history of epilepsy and other diseases of the nervous system: Secondary | ICD-10-CM

## 2020-05-27 DIAGNOSIS — G2581 Restless legs syndrome: Secondary | ICD-10-CM

## 2020-05-27 DIAGNOSIS — G4733 Obstructive sleep apnea (adult) (pediatric): Secondary | ICD-10-CM

## 2020-05-27 DIAGNOSIS — G4761 Periodic limb movement disorder: Secondary | ICD-10-CM

## 2020-05-27 DIAGNOSIS — R519 Headache, unspecified: Secondary | ICD-10-CM

## 2020-05-27 DIAGNOSIS — R351 Nocturia: Secondary | ICD-10-CM

## 2020-05-27 NOTE — Telephone Encounter (Signed)
Pt called stating that she had LVM stating that she will not be able to make her sleep study appt tonight but then called back again and stated that she will be able to make it after all. She would like to make sure that she will be expected for her 8pm appt tonight. Pt was informed that her appt was still on the schedule.

## 2020-06-02 ENCOUNTER — Other Ambulatory Visit: Payer: Self-pay | Admitting: Adult Health

## 2020-06-02 ENCOUNTER — Ambulatory Visit (INDEPENDENT_AMBULATORY_CARE_PROVIDER_SITE_OTHER): Payer: 59 | Admitting: Adult Health

## 2020-06-02 ENCOUNTER — Telehealth: Payer: Self-pay | Admitting: Neurology

## 2020-06-02 ENCOUNTER — Encounter: Payer: Self-pay | Admitting: Adult Health

## 2020-06-02 ENCOUNTER — Other Ambulatory Visit: Payer: Self-pay

## 2020-06-02 DIAGNOSIS — J4530 Mild persistent asthma, uncomplicated: Secondary | ICD-10-CM | POA: Diagnosis not present

## 2020-06-02 MED ORDER — FLOVENT HFA 110 MCG/ACT IN AERO: 2.0000 | INHALATION_SPRAY | Freq: Every day | RESPIRATORY_TRACT | 12 refills | Status: DC

## 2020-06-02 MED ORDER — VENTOLIN HFA 108 (90 BASE) MCG/ACT IN AERS: INHALATION_SPRAY | RESPIRATORY_TRACT | 2 refills | Status: DC

## 2020-06-02 MED FILL — ALBUTEROL SULFATE HFA 108 (: 108 (90 BAS | 25 days supply | Qty: 18 | Fill #0

## 2020-06-02 MED FILL — FLOVENT HFA 110 MCG INHALER: 110 | 60 days supply | Qty: 12 | Fill #0

## 2020-06-02 NOTE — Patient Instructions (Addendum)
Change QVAR to Flovent 110 2 puffs daily, rinse after use May use Ventolin as needed for wheezing. Follow-up with Dr. Halford Chessman  Or Aycen Porreca NP in 1 year and as needed

## 2020-06-02 NOTE — Progress Notes (Signed)
Virtual Visit via Telephone Note  I connected with Donna Willis on 06/02/20 at  1:30 PM EDT by telephone and verified that I am speaking with the correct person using two identifiers.  Location: Patient: Home  Provider: Office    I discussed the limitations, risks, security and privacy concerns of performing an evaluation and management service by telephone and the availability of in person appointments. I also discussed with the patient that there may be a patient responsible charge related to this service. The patient expressed understanding and agreed to proceed.   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  She is on Qvar 2 puffs daily.  Patient says that she has been doing well.  She is had no flare of her breathing over the last year.  Has very rare use of her albuterol.  Activity tolerance has been at her baseline.  She says she has had some stress with her family as she is now the caregiver for her mom and husband.  She says she had her abdominal hernia surgery last year and did very well.  She was recently having some daytime sleepiness and was referred to neurology recently had a sleep study which is pending for results.    Observations/Objective: PFT 2014 showed normal lung function with no airflow obstruction or restriction. DLCO was normal.  Mid flow reversibility  Speaks in full sentences with no audible wheezing or distress.  Assessment and Plan: Mild persistent asthma excellent control on Qvar.  Insurance is requiring a different inhaler as Qvar is not on their formulary.  Flovent HFA is on their preferred list.  Plan  Patient Instructions  Change QVAR to Flovent 110 2 puffs daily, rinse after use May use Ventolin as needed for wheezing. Follow-up with Dr. Halford Chessman  Or Mercedes Fort NP in 1 year and as needed      Follow Up Instructions: Follow-up in 1 year and as needed   I discussed the assessment and treatment plan with the patient. The patient was provided an opportunity to ask questions and all were answered. The patient agreed with the plan and demonstrated an understanding of the instructions.   The patient was advised to call back or seek an in-person evaluation if the symptoms worsen or if the condition fails to improve as anticipated.  I provided 22  minutes of non-face-to-face time during this encounter.   Rexene Edison, NP

## 2020-06-03 NOTE — Progress Notes (Signed)
Reviewed and agree with assessment/plan.   Chesley Mires, MD [facility]monary[facility]Critical [facility] 06/03/2020, 8:17 AM Pager:  [phone]

## 2020-06-07 NOTE — Telephone Encounter (Signed)
error 

## 2020-06-09 NOTE — Procedures (Signed)
PATIENT'S NAME:  Donna Willis, Donna Willis DOB:      [birth year]      MR#:    [id number]     DATE OF RECORDING: 05/27/2020 REFERRING M.D.:  Shawnie Dapper MD Study Performed:   Baseline Polysomnogram HISTORY: 59 year old woman with a history of interstitial cystitis, prediabetes, reflux disease, depression, asthma, allergic rhinitis, histoplasmosis, and obesity, who reports excessive daytime somnolence, for the past several months, particularly worse over the past 2 to 3 months. The patient endorsed the Epworth Sleepiness Scale at 15 points. The patient's weight 224 pounds with a height of 65 (inches), resulting in a BMI of 37.5 kg/m2. The patient's neck circumference measured 17 inches.  CURRENT MEDICATIONS: Tylenol, Qvar inhaler, Wellbutrin, Voltaren, Pepcid, Prozac, Glucophage, Zofran, Nasacort, Ventolin   PROCEDURE:  This is a multichannel digital polysomnogram utilizing the Somnostar 11.2 system.  Electrodes and sensors were applied and monitored per AASM Specifications.   EEG, EOG, Chin and Limb EMG, were sampled at 200 Hz.  ECG, Snore and Nasal Pressure, Thermal Airflow, Respiratory Effort, CPAP Flow and Pressure, Oximetry was sampled at 50 Hz. Digital video and audio were recorded.      BASELINE STUDY  Lights Out was at 21:44 and Lights On at 05:01.  Total recording time (TRT) was 437.5 minutes, with a total sleep time (TST) of 389.5 minutes.   The patient's sleep latency was 1.5 minutes.  REM latency was 145 minutes, which is delayed. The sleep efficiency was 89. %.     SLEEP ARCHITECTURE: WASO (Wake after sleep onset) was 46.5 minutes with some sleep fragmentation noted in the first part of the study. There were 18 minutes in Stage N1, 260.5 minutes Stage N2, 31.5 minutes Stage N3 and 79.5 minutes in Stage REM.  The percentage of Stage N1 was 4.6%, Stage N2 was 66.9%, which is increased, Stage N3 was 8.1% and Stage R (REM sleep) was 20.4%, which is normal. The arousals were noted as: 4 were spontaneous,  6 were associated with PLMs, 25 were associated with respiratory events.  RESPIRATORY ANALYSIS:  There were a total of 174 respiratory events:  4 obstructive apneas, 0 central apneas and 0 mixed apneas with a total of 4 apneas and an apnea index (AI) of .6 /hour. There were 170 hypopneas with a hypopnea index of 26.2 /hour. The patient also had 0 respiratory event related arousals (RERAs).       The total APNEA/HYPOPNEA INDEX (AHI) was 26.8/hour and the total RESPIRATORY DISTURBANCE INDEX was  26.8 /hour.  88 events occurred in REM sleep and 172 events in NREM. The REM AHI was  66.4 /hour, versus a non-REM AHI of 16.6. The patient spent 141.5 minutes of total sleep time in the supine position and 248 minutes in non-supine.. The supine AHI was 47.1 versus a non-supine AHI of 15.2.  OXYGEN SATURATION & C02:  The Wake baseline 02 saturation was 94%, with the lowest being 59%. Time spent below 89% saturation equaled 192 minutes.  PERIODIC LIMB MOVEMENTS: The patient had a total of 201 Periodic Limb Movements.  The Periodic Limb Movement (PLM) index was 31. and the PLM Arousal index was .9/hour.  Audio and video analysis did not show any abnormal or unusual movements, behaviors, phonations or vocalizations. The patient took 3 bathroom breaks. Mild to moderate intermittent snoring was noted. The EKG was in keeping with normal sinus rhythm (NSR). Post-study, the patient indicated that sleep was better than usual.   IMPRESSION: 1. Obstructive Sleep Apnea (OSA) 2. Periodic  Limb Movement Disorder (PLMD) 3. Dysfunctions associated with sleep stages or arousal from sleep  RECOMMENDATIONS: 1. This study demonstrates moderate to severe obstructive sleep apnea, with a total AHI of 26.8/hour, REM AHI of 66.4/hour, supine AHI of 47.1/hour and O2 nadir of 59% (in supine REM sleep). Treatment with positive airway pressure in the form of CPAP is recommended. This will require a full night titration study to optimize  therapy. Other treatment options may include avoidance of supine sleep position along with weight loss, upper airway or jaw surgery in selected patients or the use of an oral appliance in certain patients. ENT evaluation and/or consultation with a maxillofacial surgeon or dentist may be feasible in some instances.   2. Please note that untreated obstructive sleep apnea may carry additional perioperative morbidity. Patients with significant obstructive sleep apnea should receive perioperative PAP therapy and the surgeons and particularly the anesthesiologist should be informed of the diagnosis and the severity of the sleep disordered breathing. 3. Moderate PLMs (periodic limb movements of sleep) were noted during this study with no significant arousals; clinical correlation is recommended. PLMs may improve with OSA treatment.  4. This study shows sleep fragmentation and abnormal sleep stage percentages; these are nonspecific findings and per se do not signify an intrinsic sleep disorder or a cause for the patient's sleep-related symptoms. Causes include (but are not limited to) the first night effect of the sleep study, circadian rhythm disturbances, medication effect or an underlying mood disorder or medical problem.  5. The patient should be cautioned not to drive, work at heights, or operate dangerous or heavy equipment when tired or sleepy. Review and reiteration of good sleep hygiene measures should be pursued with any patient. 6. The patient will be seen in follow-up in the sleep clinic at [facility] for discussion of the test results, symptom and treatment compliance review, further management strategies, etc. The referring provider will be notified of the test results.  I certify that I have reviewed the entire raw data recording prior to the issuance of this report in accordance with the Standards of Accreditation of the American Academy of Sleep Medicine (AASM)  Star Age, MD, PhD Diplomat, American  Board of Neurology and Sleep Medicine (Neurology and Sleep Medicine)

## 2020-06-09 NOTE — Progress Notes (Signed)
Patient referred by Dr. Anitra Lauth, seen by me on 05/09/20, diagnostic PSG on 05/27/20.   Please call and notify the patient that the recent sleep study showed moderate to severe obstructive sleep apnea. I recommend treatment for this in the form of CPAP. This will require a repeat sleep study for proper titration and mask fitting and correct monitoring of the oxygen saturations. Please explain to patient. I have placed an order in the chart. Thanks.  Star Age, MD, PhD [facility])

## 2020-06-09 NOTE — Addendum Note (Signed)
Addended by: Star Age on: 06/09/2020 05:54 PM   Modules accepted: Orders

## 2020-06-13 ENCOUNTER — Telehealth: Payer: Self-pay

## 2020-06-13 NOTE — Telephone Encounter (Signed)
-----   Message from Star Age, MD sent at 06/09/2020  5:54 PM EDT ----- Patient referred by Dr. Anitra Lauth, seen by me on 05/09/20, diagnostic PSG on 05/27/20.   Please call and notify the patient that the recent sleep study showed moderate to severe obstructive sleep apnea. I recommend treatment for this in the form of CPAP. This will require a repeat sleep study for proper titration and mask fitting and correct monitoring of the oxygen saturations. Please explain to patient. I have placed an order in the chart. Thanks.  Star Age, MD, PhD [facility])

## 2020-06-13 NOTE — Telephone Encounter (Signed)
I called pt. No answer, left a message asking pt to call me back.   

## 2020-06-13 NOTE — Telephone Encounter (Signed)
Pt calling back in response to LVM.

## 2020-06-15 NOTE — Telephone Encounter (Signed)
I called pt. I advised pt that Dr. Rexene Alberts reviewed their sleep study results and found that moderate/severe osa was found and recommends that pt be treated with a cpap. Dr. Rexene Alberts recommends that pt return for a repeat sleep study in order to properly titrate the cpap and ensure a good mask fit. Pt is agreeable to returning for a titration study. I advised pt that our sleep lab will file with pt's insurance and call pt to schedule the sleep study when we hear back from the pt's insurance regarding coverage of this sleep study. Pt verbalized understanding of results. Pt had no questions at this time but was encouraged to call back if questions arise.

## 2020-06-20 MED FILL — METFORMIN HCL 500 MG TABS: 500 | 30 days supply | Qty: 30 | Fill #1

## 2020-06-26 ENCOUNTER — Ambulatory Visit (INDEPENDENT_AMBULATORY_CARE_PROVIDER_SITE_OTHER): Payer: 59 | Admitting: Neurology

## 2020-06-26 DIAGNOSIS — R519 Headache, unspecified: Secondary | ICD-10-CM

## 2020-06-26 DIAGNOSIS — G4720 Circadian rhythm sleep disorder, unspecified type: Secondary | ICD-10-CM

## 2020-06-26 DIAGNOSIS — Z820 Family history of epilepsy and other diseases of the nervous system: Secondary | ICD-10-CM

## 2020-06-26 DIAGNOSIS — G2581 Restless legs syndrome: Secondary | ICD-10-CM

## 2020-06-26 DIAGNOSIS — G4733 Obstructive sleep apnea (adult) (pediatric): Secondary | ICD-10-CM | POA: Diagnosis not present

## 2020-06-26 DIAGNOSIS — G4719 Other hypersomnia: Secondary | ICD-10-CM

## 2020-06-26 DIAGNOSIS — G4761 Periodic limb movement disorder: Secondary | ICD-10-CM

## 2020-06-26 DIAGNOSIS — R351 Nocturia: Secondary | ICD-10-CM

## 2020-06-26 DIAGNOSIS — E669 Obesity, unspecified: Secondary | ICD-10-CM

## 2020-06-30 ENCOUNTER — Telehealth: Payer: 59 | Admitting: Emergency Medicine

## 2020-06-30 DIAGNOSIS — M5440 Lumbago with sciatica, unspecified side: Secondary | ICD-10-CM | POA: Diagnosis not present

## 2020-06-30 MED ORDER — CYCLOBENZAPRINE HCL 10 MG PO TABS
10.0000 mg | ORAL_TABLET | Freq: Three times a day (TID) | ORAL | 0 refills | Status: DC | PRN
Start: 2020-06-30 — End: 2020-09-22

## 2020-06-30 MED FILL — CYCLOBENZAPRINE HCL 10 MG T: 10 | 3 days supply | Qty: 10 | Fill #0

## 2020-06-30 NOTE — Progress Notes (Signed)
We are sorry that you are not feeling well.  Here is how we plan to help!  Based on what you have shared with me it looks like you mostly have acute back pain.  This could be due to muscle strain or possibly a slipped disc.    I would recommend that you use ICE for the first 72 hours rather than heat.  Acute back pain is defined as musculoskeletal pain that can resolve in 1-3 weeks with conservative treatment.  I have prescribed Flexeril 10 mg every eight hours as needed which is a muscle relaxer and I recommend continuing with Ibuprofen. Some patients experience stomach irritation or in increased heartburn with anti-inflammatory drugs.  Please keep in mind that muscle relaxer's can cause fatigue and should not be taken while at work or driving.  Back pain is very common.  The pain often gets better over time.  The cause of back pain is usually not dangerous.  Most people can learn to manage their back pain on their own.  Home Care  Stay active.  Start with short walks on flat ground if you can.  Try to walk farther each day.  Do not sit, drive or stand in one place for more than 30 minutes.  Do not stay in bed.  Do not avoid exercise or work.  Activity can help your back heal faster.  Be careful when you bend or lift an object.  Bend at your knees, keep the object close to you, and do not twist.  Sleep on a firm mattress.  Lie on your side, and bend your knees.  If you lie on your back, put a pillow under your knees.  Only take medicines as told by your doctor.  Put ice on the injured area.  Put ice in a plastic bag  Place a towel between your skin and the bag  Leave the ice on for 15-20 minutes, 3-4 times a day for the first 2-3 days. 210 After that, you can switch between ice and heat packs.  Ask your doctor about back exercises or massage.  Avoid feeling anxious or stressed.  Find good ways to deal with stress, such as exercise.  Get Help Right Way If:  Your pain does not go  away with rest or medicine.  Your pain does not go away in 1 week.  You have new problems.  You do not feel well.  The pain spreads into your legs.  You cannot control when you poop (bowel movement) or pee (urinate)  You feel sick to your stomach (nauseous) or throw up (vomit)  You have belly (abdominal) pain.  You feel like you may pass out (faint).  If you develop a fever.  Make Sure you:  Understand these instructions.  Will watch your condition  Will get help right away if you are not doing well or get worse.  Your e-visit answers were reviewed by a board certified advanced clinical practitioner to complete your personal care plan.  Depending on the condition, your plan could have included both over the counter or prescription medications.  If there is a problem please reply  once you have received a response from your provider.  Your safety is important to [place].  If you have drug allergies check your prescription carefully.    You can use MyChart to ask questions about today's visit, request a non-urgent call back, or ask for a work or school excuse for 24 hours related to this e-Visit.  If it has been greater than 24 hours you will need to follow up with your provider, or enter a new e-Visit to address those concerns.  You will get an e-mail in the next two days asking about your experience.  I hope that your e-visit has been valuable and will speed your recovery. Thank you for using e-visits.  Approximately 5 minutes was used in reviewing the patient's chart, questionnaire, prescribing medications, and documentation.

## 2020-07-05 ENCOUNTER — Other Ambulatory Visit: Payer: Self-pay | Admitting: Family Medicine

## 2020-07-05 MED FILL — buPROPion HCL ER (XL) 150 M: 150 | 90 days supply | Qty: 90 | Fill #0

## 2020-07-07 NOTE — Addendum Note (Signed)
Addended by: Star Age on: 07/07/2020 06:29 PM   Modules accepted: Orders

## 2020-07-07 NOTE — Procedures (Signed)
PATIENT'S NAME:  Donna Willis, Donna Willis DOB:      13-Apr-1961      MR#:    [id number]     DATE OF RECORDING: 06/26/2020 REFERRING M.D.:  Shawnie Dapper MD Study Performed:   CPAP  Titration HISTORY:  60 year old woman with a history of interstitial cystitis, prediabetes, reflux disease, depression, asthma, allergic rhinitis, histoplasmosis, and obesity, who presents for a full night Titration study to treat her obstructive sleep apnea.  Her baseline sleep study from 05/27/2020 demonstrated moderate to severe obstructive sleep apnea, with a total AHI of 26.8/hour, REM AHI of 66.4/hour, supine AHI of 47.1/hour and O2 nadir of 59% (in supine REM sleep). The patient endorsed the Epworth Sleepiness Scale at 15/24 points. The patient's weight 224 pounds with a height of 65 (inches), resulting in a BMI of 37.5 kg/m2. The patient's neck circumference measured 17 inches.  CURRENT MEDICATIONS: Tylenol, Qvar inhaler, Wellbutrin, Voltaren, Pepcid, Prozac, Glucophage, Zofran, Nasacort, Ventolin  PROCEDURE:  This is a multichannel digital polysomnogram utilizing the SomnoStar 11.2 system.  Electrodes and sensors were applied and monitored per AASM Specifications.   EEG, EOG, Chin and Limb EMG, were sampled at 200 Hz.  ECG, Snore and Nasal Pressure, Thermal Airflow, Respiratory Effort, CPAP Flow and Pressure, Oximetry was sampled at 50 Hz. Digital video and audio were recorded.      CPAP was initiated per AASM standards. The patient was fitted with a medium DreamWear full facemask and CPAP was initiated at a pressure of 5 cm and gradually titrated to a final pressure of 10 cm.  On this pressure, her residual AHI was 1.2/h, O2 nadir 91%, with supine NREM sleep achieved.  Lights Out was at 00:03 and Lights On at 07:28. Total recording time (TRT) was 445 minutes, with a total sleep time (TST) of 382.5 minutes. The patient's sleep latency was 12 minutes. REM latency was 162 minutes, which is delayed. The sleep efficiency was 86. %.     SLEEP ARCHITECTURE: WASO (Wake after sleep onset) was 60 minutes with several periods of wakefulness. There were 6.5 minutes in Stage N1, 323 minutes Stage N2, 6 minutes Stage N3 and 47 minutes in Stage REM.  The percentage of Stage N1 was 1.7%, Stage N2 was 84.4%, which is increased, Stage N3 was 1.6% and Stage R (REM sleep) was 12.3%, which is reduced. The arousals were noted as: 15 were spontaneous, 29 were associated with PLMs, 4 were associated with respiratory events.  RESPIRATORY ANALYSIS:  There was a total of 32 respiratory events: 3 obstructive apneas, 1 central apneas and 0 mixed apneas with a total of 4 apneas and an apnea index (AHI) of .6 /hour. There were 28 hypopneas with a hypopnea index of 4.4/hour. The patient also had 0 respiratory event related arousals (RERAs).      The total APNEA/HYPOPNEA INDEX  (AHI) was 5. /hour and the total RESPIRATORY DISTURBANCE INDEX was 5. /hour  4 events occurred in REM sleep and 28 events in NREM. The REM AHI was 5.1 /hour versus a non-REM AHI of 5. /hour.  The patient spent 314.5 minutes of total sleep time in the supine position and 68 minutes in non-supine. The supine AHI was 5.6, versus a non-supine AHI of 2.7.  OXYGEN SATURATION & C02:  The baseline 02 saturation was 94%, with the lowest being 87%. Time spent below 89% saturation equaled 2 minutes.  PERIODIC LIMB MOVEMENTS:  The patient had a total of 400 Periodic Limb Movements. The Periodic Limb Movement (PLM)  index was 62.7 and the PLM Arousal index was 4.5 /hour.   Audio and video analysis did not show any abnormal or unusual movements, behaviors, phonations or vocalizations. The patient took 4 bathroom breaks. The EKG was in keeping with normal sinus rhythm.  Post-study, the patient indicated that sleep was better than usual.   IMPRESSION: 1. Obstructive Sleep Apnea (OSA) 2. Periodic Limb Movement Disorder (PLMD) 3. Dysfunctions associated with sleep stages or arousal from sleep    RECOMMENDATIONS: 1. This study demonstrates resolution of the patient's obstructive sleep apnea with CPAP therapy. I will, therefore, start the patient on home CPAP treatment at a pressure of 10 cm via medium Full face mask with (heated) humidity. The patient should be reminded to be fully compliant with PAP therapy to improve sleep related symptoms and decrease long term cardiovascular risks. The patient should be reminded, that it may take up to 3 months to get fully used to using PAP with all planned sleep. The earlier full compliance is achieved, the better long term compliance tends to be. Please note that untreated obstructive sleep apnea may carry additional perioperative morbidity. Patients with significant obstructive sleep apnea should receive perioperative PAP therapy and the surgeons and particularly the anesthesiologist should be informed of the diagnosis and the severity of the sleep disordered breathing. 2. Severe PLMs (periodic limb movements of sleep) were noted during this study with minimal arousals; clinical correlation is recommended. PLMs may improve with OSA treatment.  3. This study shows sleep fragmentation and abnormal sleep stage percentages; these are nonspecific findings and per se do not signify an intrinsic sleep disorder or a cause for the patient's sleep-related symptoms. Causes include (but are not limited to) the first night effect of the sleep study, circadian rhythm disturbances, medication effect or an underlying mood disorder or medical problem.  4. The patient should be cautioned not to drive, work at heights, or operate dangerous or heavy equipment when tired or sleepy. Review and reiteration of good sleep hygiene measures should be pursued with any patient. 5. The patient will be seen in follow-up in the sleep clinic at [facility] for discussion of the test results, symptom and treatment compliance review, further management strategies, etc. The referring provider will be  notified of the test results.   I certify that I have reviewed the entire raw data recording prior to the issuance of this report in accordance with the Standards of Accreditation of the American Academy of Sleep Medicine (AASM)   Star Age, MD, PhD Diplomat, American Board of Neurology and Sleep Medicine (Neurology and Sleep Medicine)

## 2020-07-07 NOTE — Progress Notes (Signed)
Patient referred by Dr. Anitra Lauth, seen by me on 05/09/20, diagnostic PSG on 05/27/20.  Patient had a CPAP titration study on 06/26/20.  Please call and inform patient that I have entered an order for treatment with positive airway pressure (PAP) treatment for obstructive sleep apnea (OSA). She did well during the latest sleep study with CPAP. We will, therefore, arrange for a machine for home use through a DME (durable medical equipment) company of Her choice; and I will see the patient back in follow-up in about 10 weeks. Please also explain to the patient that I will be looking out for compliance data, which can be downloaded from the machine (stored on an SD card, that is inserted in the machine) or via remote access through a modem, that is built into the machine. At the time of the followup appointment we will discuss sleep study results and how it is going with PAP treatment at home. Please advise patient to bring Her machine at the time of the first FU visit, even though this is cumbersome. Bringing the machine for every visit after that will likely not be needed, but often helps for the first visit to troubleshoot if needed. Please re-enforce the importance of compliance with treatment and the need for [place] to monitor compliance data - often an insurance requirement and actually good feedback for the patient as far as how they are doing.  Also remind patient, that any interim PAP machine or mask issues should be first addressed with the DME company, as they can often help better with technical and mask fit issues. Please ask if patient has a preference regarding DME company.  Please also make sure, the patient has a follow-up appointment with me in about 10 weeks from the setup date, thanks. May see one of our nurse practitioners if needed for proper timing of the FU appointment.  Please fax or rout report to the referring provider. Thanks,   Star Age, MD, PhD [facility])

## 2020-07-12 ENCOUNTER — Telehealth: Payer: Self-pay

## 2020-07-12 NOTE — Telephone Encounter (Signed)
I called pt. No answer, left a message asking pt to call me back.   

## 2020-07-12 NOTE — Telephone Encounter (Signed)
-----   Message from Star Age, MD sent at 07/07/2020  6:29 PM EDT ----- Patient referred by Dr. Anitra Lauth, seen by me on 05/09/20, diagnostic PSG on 05/27/20.  Patient had a CPAP titration study on 06/26/20.  Please call and inform patient that I have entered an order for treatment with positive airway pressure (PAP) treatment for obstructive sleep apnea (OSA). She did well during the latest sleep study with CPAP. We will, therefore, arrange for a machine for home use through a DME (durable medical equipment) company of Her choice; and I will see the patient back in follow-up in about 10 weeks. Please also explain to the patient that I will be looking out for compliance data, which can be downloaded from the machine (stored on an SD card, that is inserted in the machine) or via remote access through a modem, that is built into the machine. At the time of the followup appointment we will discuss sleep study results and how it is going with PAP treatment at home. Please advise patient to bring Her machine at the time of the first FU visit, even though this is cumbersome. Bringing the machine for every visit after that will likely not be needed, but often helps for the first visit to troubleshoot if needed. Please re-enforce the importance of compliance with treatment and the need for [place] to monitor compliance data - often an insurance requirement and actually good feedback for the patient as far as how they are doing.  Also remind patient, that any interim PAP machine or mask issues should be first addressed with the DME company, as they can often help better with technical and mask fit issues. Please ask if patient has a preference regarding DME company.  Please also make sure, the patient has a follow-up appointment with me in about 10 weeks from the setup date, thanks. May see one of our nurse practitioners if needed for proper timing of the FU appointment.  Please fax or rout report to the referring provider.  Thanks,   Star Age, MD, PhD [facility])

## 2020-07-13 DIAGNOSIS — G4733 Obstructive sleep apnea (adult) (pediatric): Secondary | ICD-10-CM

## 2020-07-13 DIAGNOSIS — Z9989 Dependence on other enabling machines and devices: Secondary | ICD-10-CM

## 2020-07-13 HISTORY — DX: Obstructive sleep apnea (adult) (pediatric): G47.33

## 2020-07-13 HISTORY — DX: Dependence on other enabling machines and devices: Z99.89

## 2020-07-13 HISTORY — PX: CPAP: SHX449

## 2020-07-13 NOTE — Telephone Encounter (Signed)
Pt called me back to discuss sleep study.   I advised pt that Dr. Rexene Alberts reviewed their sleep study results and found that pt did well during her titration study on cpap. Dr. Rexene Alberts recommends that pt start cpap therapy at home for treatment. I reviewed PAP compliance expectations with the pt. Pt is agreeable to starting a CPAP. I advised pt that an order will be sent to a DME, [facility], and they will call the pt within about one week after they file with the pt's insurance. [facility] will show the pt how to use the machine, fit for masks, and troubleshoot the CPAP if needed. A follow up appt was made for insurance purposes with Dr. Rexene Alberts on 09/2020 at 300 pm. Pt verbalized understanding to arrive 15 minutes early and bring their CPAP. A letter with all of this information in it will be mailed to the pt as a reminder. I verified with the pt that the address we have on file is correct. Pt verbalized understanding of results. Pt had no questions at this time but was encouraged to call back if questions arise. I have sent the order to [facility] and have received confirmation that they have received the order.

## 2020-07-19 MED FILL — METFORMIN HCL 500 MG TABS: 500 | 30 days supply | Qty: 30 | Fill #2

## 2020-07-20 MED FILL — FLUoxetine HCL 20 MG CAPS: 20 | 90 days supply | Qty: 90 | Fill #1

## 2020-07-25 ENCOUNTER — Other Ambulatory Visit: Payer: Self-pay

## 2020-07-25 ENCOUNTER — Ambulatory Visit: Payer: Self-pay

## 2020-07-25 ENCOUNTER — Telehealth: Payer: Self-pay | Admitting: Neurology

## 2020-07-25 ENCOUNTER — Ambulatory Visit: Payer: 59 | Admitting: Family Medicine

## 2020-07-25 ENCOUNTER — Encounter: Payer: Self-pay | Admitting: Family Medicine

## 2020-07-25 VITALS — BP 113/77 | HR 64 | Ht 65.0 in | Wt 230.0 lb

## 2020-07-25 DIAGNOSIS — M25461 Effusion, right knee: Secondary | ICD-10-CM | POA: Diagnosis not present

## 2020-07-25 DIAGNOSIS — M25561 Pain in right knee: Secondary | ICD-10-CM

## 2020-07-25 MED ORDER — PREDNISONE 5 MG PO TABS: ORAL_TABLET | ORAL | 0 refills | Status: DC

## 2020-07-25 MED FILL — predniSONE 5 MG (21) TBPK: 5 | 6 days supply | Qty: 21 | Fill #0

## 2020-07-25 NOTE — Patient Instructions (Signed)
Nice to meet you Please try ice  Please try compression  Please try voltaren after the prednisone  Please try the exercises   Please send me a message in MyChart with any questions or updates.  Please see me back in 4 weeks.   --Dr. Raeford Razor

## 2020-07-25 NOTE — Telephone Encounter (Signed)
Thanks for this update. I have faxed order to [facility] and received confirmation.

## 2020-07-25 NOTE — Telephone Encounter (Signed)
Ivin Booty from [facility] called stating that she received the OV and the Sleep Study for the pt but did not receive the actual order for the pt's Cpap machine that is to be set for 10 pressure. Please fax order to [phone] and if there are any questions please call [phone]

## 2020-07-25 NOTE — Progress Notes (Signed)
Donna Willis - 59 y.o. female MRN [id number]  Date of birth: [birth year]  SUBJECTIVE:  Including CC & ROS.  Chief Complaint  Patient presents with  . Knee Pain    right x 2 weeks    Donna Willis is a 59 y.o. female that is presenting with right knee pain.  The pain has been ongoing for about 2 weeks.  No injury or inciting event.  She notices pain pain with going up and down stairs.  No history of surgery.   Review of Systems See HPI   HISTORY: Past Medical, Surgical, Social, and Family History Reviewed & Updated per EMR.   Pertinent Historical Findings include:  Past Medical History:  Diagnosis Date  . Abdominal wall hernia   . Allergic rhinitis   . Atypical chest pain 08/2012   Normal Myoview 09/2012  . Borderline hyperlipidemia 05/2018; 08/2019   2020 Framingham CV risk 2%  . Contact dermatitis    poison ivy/oak  . Daytime sleepiness   . Epigastric hernia 04/2019   Repaired 06/26/19  . GERD (gastroesophageal reflux disease)   . Hay fever    seasonal  . Hernia   . 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 Interstitial cystitis    Dr. Rosana Hoes: hasn't seen him in yrs or so, was on elmiron and coldn't tell much difference.  . MDD (major depressive disorder)   . Mild persistent asthma 08/31/2013   [place] pulm: stable as of pulm f/u 02/2018 and 05/2019: no med changes made---1 yr f/u recommended.  . Personal history of  adenomatous colonic polyp 10/28/2012   10/2012 - 4x10 mm tubular adenoma; repeat 09/03/16 showed NO POLYPS.  Recall 5 yrs (08/2021).  . Prediabetes 08/2019   08/20/19 HbA1c 6.4%->recommended metformin at that time.    Past Surgical History:  Procedure Laterality Date  . CARDIOVASCULAR STRESS TEST  09/2012   Rest/Stress myoview: normal  . CESAREAN SECTION  1988  . CHOLECYSTECTOMY  2002  . COLONOSCOPY W/ POLYPECTOMY  10/22/12; 09/03/16   2013; tubular adenoma, no high grade dysplasia (Dr. Carlean Purl).  Repeat 08/2016: no polyps.  Recall 5 yrs (08/2021--Dr.  Carlean Purl).  . CYSTOSCOPY     with bladder bx 07/22/18-->  . EPIGASTRIC HERNIA REPAIR N/A 06/26/2019   Procedure: LAPAROSCOPIC EPIGASTRIC HERNIA REPAIR WITH MESH;  Surgeon: Alphonsa Overall, MD;  Location: [facility];  Service: General;  Laterality: N/A;  . HERNIA REPAIR  0174   umbilical, during lap chole  . TONSILLECTOMY AND ADENOIDECTOMY  1974  . UMBILICAL HERNIA REPAIR  08/19/2012   Procedure: LAPAROSCOPIC UMBILICAL HERNIA;  Surgeon: Shann Medal, MD;  Location: [facility];  Service: General;  Laterality: N/A;    Family History  Problem Relation Age of Onset  . Colon polyps Mother   . Diverticulitis Father   . Other Maternal Uncle        great-stom or colon cancer  . Colon cancer Maternal Uncle   . Rectal cancer Neg Hx     Social History   Socioeconomic History  . Marital status: Married    Spouse name: Not on file  . Number of children: Not on file  . Years of education: Not on file  . Highest education level: Not on file  Occupational History  . Not on file  Tobacco Use  . Smoking status: Never Smoker  . Smokeless tobacco: Never Used  Vaping Use  . Vaping Use: Never used  Substance and Sexual Activity  . Alcohol use: Yes  Comment: rare  . Drug use: No  . Sexual activity: Never    Birth control/protection: None  Other Topics Concern  . Not on file  Social History Narrative   Married, 1 son.   Orig from [place] but has lived in [place] >20 yrs.   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   Exercise: walks 40 min 3X/week.     No T/A/Ds   Social Determinants of Health   Financial Resource Strain:   . Difficulty of Paying Living Expenses: Not on file  Food Insecurity:   . Worried About Charity fundraiser in the Last Year: Not on file  . Ran Out of Food in the Last Year: Not on file  Transportation Needs:   . Lack of Transportation (Medical): Not on file  . Lack of Transportation (Non-Medical): Not on file  Physical  Activity:   . Days of Exercise per Week: Not on file  . Minutes of Exercise per Session: Not on file  Stress:   . Feeling of Stress : Not on file  Social Connections:   . Frequency of Communication with Friends and Family: Not on file  . Frequency of Social Gatherings with Friends and Family: Not on file  . Attends Religious Services: Not on file  . Active Member of Clubs or Organizations: Not on file  . Attends Archivist Meetings: Not on file  . Marital Status: Not on file  Intimate Partner Violence:   . Fear of Current or Ex-Partner: Not on file  . Emotionally Abused: Not on file  . Physically Abused: Not on file  . Sexually Abused: Not on file     PHYSICAL EXAM:  VS: BP 113/77   Pulse 64   Ht 5\' 5"  (1.651 m)   Wt 230 lb (104.3 kg)   BMI 38.27 kg/m  Physical Exam Gen: NAD, alert, cooperative with exam, well-appearing MSK:  Right knee: No effusion. Normal range of motion No instability. No tenderness to palpation of the medial or lateral joint space. Negative McMurray's test. Neurovascularly intact  Limited ultrasound: Right knee:  Mild effusion. Normal-appearing quadricep and patellar tendon. Increased hyperemia of Hoffa's fat pad. Mild degenerative changes of the medial joint space. No significant change of the lateral joint space.  Summary: Effusion with findings suggestive of fat pad impingement.  Ultrasound and interpretation by Clearance Coots, MD    ASSESSMENT & PLAN:   Effusion, right knee Effusion is present and she does have mild degenerative changes on ultrasound.  She does have findings consistent with a fat pad impingement. -Counseled on home exercise therapy and supportive care. -Prednisone. -Could consider imaging or injection.

## 2020-07-26 DIAGNOSIS — M25461 Effusion, right knee: Secondary | ICD-10-CM | POA: Insufficient documentation

## 2020-07-26 MED FILL — FLOVENT HFA 110 MCG INHALER: 110 | 60 days supply | Qty: 12 | Fill #1

## 2020-07-26 NOTE — Assessment & Plan Note (Signed)
Effusion is present and she does have mild degenerative changes on ultrasound.  She does have findings consistent with a fat pad impingement. -Counseled on home exercise therapy and supportive care. -Prednisone. -Could consider imaging or injection.

## 2020-08-01 DIAGNOSIS — G4733 Obstructive sleep apnea (adult) (pediatric): Secondary | ICD-10-CM | POA: Diagnosis not present

## 2020-08-16 MED FILL — METFORMIN HCL 500 MG TABS: 500 | 30 days supply | Qty: 30 | Fill #3

## 2020-08-31 DIAGNOSIS — G4733 Obstructive sleep apnea (adult) (pediatric): Secondary | ICD-10-CM | POA: Diagnosis not present

## 2020-09-14 MED FILL — METFORMIN HCL 500 MG TABS: 500 | 30 days supply | Qty: 30 | Fill #4

## 2020-09-22 ENCOUNTER — Ambulatory Visit: Payer: 59 | Admitting: Neurology

## 2020-09-22 ENCOUNTER — Other Ambulatory Visit: Payer: Self-pay

## 2020-09-22 ENCOUNTER — Encounter: Payer: Self-pay | Admitting: Neurology

## 2020-09-22 VITALS — BP 115/69 | HR 66 | Ht 65.0 in | Wt 236.0 lb

## 2020-09-22 DIAGNOSIS — G2581 Restless legs syndrome: Secondary | ICD-10-CM | POA: Diagnosis not present

## 2020-09-22 DIAGNOSIS — G4733 Obstructive sleep apnea (adult) (pediatric): Secondary | ICD-10-CM | POA: Diagnosis not present

## 2020-09-22 DIAGNOSIS — G4761 Periodic limb movement disorder: Secondary | ICD-10-CM | POA: Diagnosis not present

## 2020-09-22 DIAGNOSIS — Z9989 Dependence on other enabling machines and devices: Secondary | ICD-10-CM | POA: Diagnosis not present

## 2020-09-22 NOTE — Patient Instructions (Signed)
It was good to see you again today.  You have adjusted well to CPAP therapy and are fully compliant.  I am very pleased that you have been able to use your CPAP consistently.   Please continue using your CPAP regularly. While your insurance requires that you use CPAP at least 4 hours each night on 70% of the nights, I recommend, that you not skip any nights and use it throughout the night if you can. Getting used to CPAP and staying with the treatment long term does take time and patience and discipline. Untreated obstructive sleep apnea when it is moderate to severe can have an adverse impact on cardiovascular health and raise her risk for heart disease, arrhythmias, hypertension, congestive heart failure, stroke and diabetes. Untreated obstructive sleep apnea causes sleep disruption, nonrestorative sleep, and sleep deprivation. This can have an impact on your day to day functioning and cause daytime sleepiness and impairment of cognitive function, memory loss, mood disturbance, and problems focussing. Using CPAP regularly can improve these symptoms. We will continue to monitor your restless leg symptoms.  We may consider medication for this down the road.  Please allow for enough sleep time, approximately 7 to 8 hours on average if possible.  Keep up the good work! I will see you back in 6 months for sleep apnea check up, and if you continue to do well on CPAP, we can see you once a year thereafter.  Please continue to work on weight loss.

## 2020-09-22 NOTE — Progress Notes (Signed)
Subjective:    Patient ID: Donna Willis is a 59 y.o. female.  HPI     Interim history:   Donna Willis is a 59 year old right-handed woman with an underlying medical history of interstitial cystitis, prediabetes, reflux disease, depression, asthma, allergic rhinitis, histoplasmosis, and obesity, who Presents for follow-up consultation of Donna obstructive sleep apnea after interim testing and starting CPAP therapy.  The patient is unaccompanied today.  I first met Donna at the request of Donna primary care physician on 05/09/2020, at which time she reported snoring and daytime somnolence.  She was advised to proceed with a sleep study.  She had a baseline sleep study, followed by a CPAP titration study.  Donna baseline sleep study from 05/27/2020 showed a sleep efficiency of 89%, sleep latency 1.5 minutes, REM latency delayed at 145 minutes.  She had a mildly increased percentage of stage II sleep.  She had a normal REM percentage at 20.4%.  Total AHI was in the moderate range at 26.8/h, REM AHI in the severe range at 66.4/h, supine AHI 47.1/h.  Average oxygen saturation was 94%, nadir was severe at 59% with time below 89% saturation of 192 minutes for the night.  She had a moderately increased PLM index at 31/h without significant arousals.  Mild to moderate snoring was noted, no significant EKG changes were noted.  She was advised to proceed with a second sleep study for proper titration with CPAP. She had a CPAP titration study on 06/26/2020.  Sleep efficiency was 86%, sleep latency 12 minutes, REM latency delayed at 162 minutes.  She was fitted with a medium DreamWear full facemask and CPAP was titrated from 5 cm to a final pressure of 10 cm.  On Donna final titration pressure Donna AHI was 1.2/h, O2 nadir 91% with supine non-REM sleep achieved.  She had an increased percentage of stage II sleep and a reduced percentage of REM sleep at 12.3%.  She had severe PLM's at 62.7/h, without significant arousals.  She was  advised to proceed with CPAP therapy at home, Donna set up date was 08/01/2020.  Today, 09/22/2020: I reviewed Donna CPAP compliance data from 08/22/2020 through 09/20/2020, which is a total of 30 days with percent use days greater than 4 hours at 93%, indicating excellent compliance with an average usage of 6 hours and 37 minutes, residual AHI at goal at 0.4/h, leak on the low side with a 95th percentile at 5 L/min on a pressure of 10 cm with EPR of 3.  She reports having adjusted well to treatment.  Donna daytime sleepiness is a little better but she has had some recent stressors and has not slept as well.  She has had some weight gain as she has not been exercising as much in the form of walking.  She had stress with regards to Donna mom's health and Donna husband's health.  She reports that Donna brother has sleep apnea as well.  She is motivated to continue with treatment but has had some instances of residual daytime sleepiness.  She feels like she is less sleepy when she is able to sleep at least 7 hours as opposed to less than 7 hours so she is motivated to try to make enough time for sleep.  She does endorse restless leg symptoms which are intermittent and some days are worse than others.  She reports that Donna mom also has restless leg syndrome.  It does not wake Donna up in the middle of the night but  sometimes she does have trouble falling asleep because of RLS symptoms.  She uses a hybrid style fullface mask with good success.  The patient's allergies, current medications, family history, past medical history, past social history, past surgical history and problem list were reviewed and updated as appropriate.   Previously:   05/09/20: (She reports) excessive daytime somnolence, for the past several months, particularly worse over the past 2 to 3 months.  She has dozed off while working at the computer, she has also come close to falling asleep at the wheel.  She reports a family history of sleep apnea affecting  Donna Willis and 2 Willis.  She does not know if she snores, Donna son has not mentioned it.  Donna husband had reported it in the past.  She has been working on weight loss.  She has lost about 40 pounds in the past several months.  She takes care of Donna husband who is disabled and Donna son helps take care of him at night.  She lives with Donna husband and Donna son.  She works as a Marine scientist for [employer], and incident reporting.  She is a non-smoker, drinks alcohol rarely, maybe a few times a year, and drinks caffeine in the form of coffee, 1 cup in the mornings about 3 days out of the week and 2 diet sodas per day on average.  She has nocturia about 2-3, sometimes as many as 5 times per night on average.  She has woken up with a headache at times.  She does have intermittent restless legs type symptoms.  I reviewed your office note from 04/14/2020.  Donna Epworth sleepiness score is 15 out of 24, fatigue severity score is 35 out of 63.  Bedtime is generally between 10 and 11 and rise time between 5 and 6.  She had a tonsillectomy and adenoidectomy around age 68 or 39.  She has 1 cat in the household.  She has a TV on in Donna bedroom typically all night.  She avoids caffeine after 2 or 3 PM.  Donna Past Medical History Is Significant For: Past Medical History:  Diagnosis Date  . Abdominal wall hernia   . Allergic rhinitis   . Atypical chest pain 08/2012   Normal Myoview 09/2012  . Borderline hyperlipidemia 05/2018; 08/2019   2020 Framingham CV risk 2%  . Contact dermatitis    poison ivy/oak  . Daytime sleepiness   . Epigastric hernia 04/2019   Repaired 06/26/19  . GERD (gastroesophageal reflux disease)   . Hay fever    seasonal  . Hernia   . 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 Interstitial cystitis    Dr. Rosana Hoes: hasn't seen him in yrs or so, was on elmiron and coldn't tell much difference.  . MDD (major depressive disorder)   . Mild persistent asthma 08/31/2013   [place] [place] [facility]: stable as of pulm f/u  02/2018 and 05/2019: no med changes made---1 yr f/u recommended.  . Personal history of  adenomatous colonic polyp 10/28/2012   10/2012 - 4x10 mm tubular adenoma; repeat 09/03/16 showed NO POLYPS.  Recall 5 yrs (08/2021).  . Prediabetes 08/2019   08/20/19 HbA1c 6.4%->recommended metformin at that time.    Donna Past Surgical History Is Significant For: Past Surgical History:  Procedure Laterality Date  . CARDIOVASCULAR STRESS TEST  09/2012   Rest/Stress myoview: normal  . CESAREAN SECTION  1988  . CHOLECYSTECTOMY  2002  . COLONOSCOPY W/ POLYPECTOMY  10/22/12; 09/03/16   2013; tubular  adenoma, no high grade dysplasia (Dr. Carlean Purl).  Repeat 08/2016: no polyps.  Recall 5 yrs (08/2021--Dr. Carlean Purl).  . CYSTOSCOPY     with bladder bx 07/22/18-->  . EPIGASTRIC HERNIA REPAIR N/A 06/26/2019   Procedure: LAPAROSCOPIC EPIGASTRIC HERNIA REPAIR WITH MESH;  Surgeon: Alphonsa Overall, MD;  Location: [facility];  Service: General;  Laterality: N/A;  . HERNIA REPAIR  3545   umbilical, during lap chole  . TONSILLECTOMY AND ADENOIDECTOMY  1974  . UMBILICAL HERNIA REPAIR  08/19/2012   Procedure: LAPAROSCOPIC UMBILICAL HERNIA;  Surgeon: Shann Medal, MD;  Location: [facility];  Service: General;  Laterality: N/A;    Donna Family History Is Significant For: Family History  Problem Relation Age of Onset  . Colon polyps Mother   . Diverticulitis Willis   . Other Maternal Uncle        great-stom or colon cancer  . Colon cancer Maternal Uncle   . Kidney failure Brother   . Rectal cancer Neg Hx     Donna Social History Is Significant For: Social History   Socioeconomic History  . Marital status: Married    Spouse name: Not on file  . Number of children: Not on file  . Years of education: Not on file  . Highest education level: Not on file  Occupational History  . Not on file  Tobacco Use  . Smoking status: Never Smoker  . Smokeless tobacco: Never Used  Vaping Use  . Vaping Use: Never used  Substance and  Sexual Activity  . Alcohol use: Yes    Comment: rare  . Drug use: No  . Sexual activity: Never    Birth control/protection: None  Other Topics Concern  . Not on file  Social History Narrative   Married, 1 son.   Orig from [place] but has lived in [place] >20 yrs.   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   Exercise: walks 40 min 3X/week.     No T/A/Ds   Social Determinants of Health   Financial Resource Strain:   . Difficulty of Paying Living Expenses: Not on file  Food Insecurity:   . Worried About Charity fundraiser in the Last Year: Not on file  . Ran Out of Food in the Last Year: Not on file  Transportation Needs:   . Lack of Transportation (Medical): Not on file  . Lack of Transportation (Non-Medical): Not on file  Physical Activity:   . Days of Exercise per Week: Not on file  . Minutes of Exercise per Session: Not on file  Stress:   . Feeling of Stress : Not on file  Social Connections:   . Frequency of Communication with Friends and Family: Not on file  . Frequency of Social Gatherings with Friends and Family: Not on file  . Attends Religious Services: Not on file  . Active Member of Clubs or Organizations: Not on file  . Attends Archivist Meetings: Not on file  . Marital Status: Not on file    Donna Allergies Are:  Allergies  Allergen Reactions  . Ciprofloxacin Itching  :   Donna Current Medications Are:  Outpatient Encounter Medications as of 09/22/2020  Medication Sig  . acetaminophen (TYLENOL) 500 MG tablet Take 1,000 mg by mouth every 6 (six) hours as needed for moderate pain.  Marland Kitchen buPROPion (WELLBUTRIN XL) 150 MG 24 hr tablet TAKE 1 TABLET BY MOUTH ONCE DAILY  . diclofenac sodium (VOLTAREN)  1 % GEL Apply 4 g topically 4 (four) times daily. To affected joint.  . famotidine (PEPCID) 20 MG tablet Take 20 mg by mouth as needed for heartburn or indigestion.  Marland Kitchen FLUoxetine (PROZAC) 20 MG capsule TAKE 1 CAPSULE  BY MOUTH ONCE DAILY. **APPOINTMENT NEEDED**  . fluticasone (FLOVENT HFA) 110 MCG/ACT inhaler Inhale 2 puffs into the lungs daily.  . metFORMIN (GLUCOPHAGE) 500 MG tablet TAKE 1 TABLET BY MOUTH ONCE DAILY AT SUPPERTIME (APPT NEEDED)  . triamcinolone (NASACORT) 55 MCG/ACT AERO nasal inhaler Place 1 spray into the nose daily as needed (allergies).   . VENTOLIN HFA 108 (90 Base) MCG/ACT inhaler INHALE 2 PUFFS INTO THE LUNGS EVERY 6 HOURS AS NEEDED FOR WHEEZING.  . [DISCONTINUED] cyclobenzaprine (FLEXERIL) 10 MG tablet Take 1 tablet (10 mg total) by mouth 3 (three) times daily as needed for muscle spasms. (Patient not taking: Reported on 09/22/2020)  . [DISCONTINUED] predniSONE (DELTASONE) 5 MG tablet Take 6 pills for first day, 5 pills second day, 4 pills third day, 3 pills fourth day, 2 pills the fifth day, and 1 pill sixth day. (Patient not taking: Reported on 09/22/2020)   No facility-administered encounter medications on file as of 09/22/2020.  :  Review of Systems:  Out of a complete 14 point review of systems, all are reviewed and negative with the exception of these symptoms as listed below: Review of Systems  Neurological:       Patient is here for initial CPAP follow-up. She reports she is still having some daytime sleepiness that she attributes to not getting enough hours of sleep at night. She does wear Donna machine all night. She reports an average night of sleep to be around 6 hours but she feels 7 hours would be better for Donna. She denies any problems tolerating the mask. She does have to get up to use the bathroom a couple times a night however but she usually has no problems returning to sleep.     Objective:  Neurological Exam  Physical Exam Physical Examination:   Vitals:   09/22/20 1457  BP: 115/69  Pulse: 66    General Examination: The patient is a very pleasant 59 y.o. female in no acute distress. She appears well-developed and well-nourished and well groomed.   HEENT:  Normocephalic, atraumatic, pupils are equal, round and reactive to light, extraocular tracking is good without limitation to gaze excursion or nystagmus noted. Hearing is grossly intact. Face is symmetric with normal facial animation. Speech is clear with no dysarthria noted. There is no hypophonia. There is no lip, neck/head, jaw or voice tremor. Neck is supple with full range of passive and active motion. There are no carotid bruits on auscultation. Oropharynx exam reveals: mild mouth dryness, adequate dental hygiene and mild airway crowding. Tongue protrudes centrally and palate elevates symmetrically. Tonsils are absent.  Chest: Clear to auscultation without wheezing, rhonchi or crackles noted.  Heart: S1+S2+0, regular and normal without murmurs, rubs or gallops noted.   Abdomen: Soft, non-tender and non-distended with normal bowel sounds appreciated on auscultation.  Extremities: There is no pitting edema in the distal lower extremities bilaterally.   Skin: Warm and dry without trophic changes noted. Scare from a cat bite L shin area.  Musculoskeletal: exam reveals no obvious joint deformities, tenderness or joint swelling or erythema.   Neurologically:  Mental status: The patient is awake, alert and oriented in all 4 spheres. Donna immediate and remote memory, attention, language skills and fund of knowledge are  appropriate. There is no evidence of aphasia, agnosia, apraxia or anomia. Speech is clear with normal prosody and enunciation. Thought process is linear. Mood is normal and affect is normal.  Cranial nerves II - XII are as described above under HEENT exam.  Motor exam: Normal bulk, strength and tone is noted. There is no tremor. Fine motor skills and coordination: grossly intact.  Cerebellar testing: No dysmetria or intention tremor. There is no truncal or gait ataxia.  Sensory exam: intact to light touch in the upper and lower extremities.  Gait, station and balance: She  stands easily. No veering to one side is noted. No leaning to one side is noted. Posture is age-appropriate and stance is narrow based. Gait shows normal stride length and normal pace. No problems turning are noted.  Assessment and Plan:  In summary, ADALIND WEITZ is a very pleasant 59 year old female with an underlying medical history of interstitial cystitis, prediabetes, reflux disease, depression, asthma, allergic rhinitis, histoplasmosis, and obesity, who presents for follow-up consultation of Donna obstructive sleep apnea after interim testing and starting CPAP therapy.  Donna baseline sleep study from 05/27/2020 showed moderate to severe obstructive sleep apnea with an AHI 26.8/h, REM AHI of 66.4/h, supine AHI of 47.1/h and O2 nadir in the critical range at 59% during supine REM sleep.  She had a subsequent titration study on 06/26/2020, she did well with CPAP therapy of 10 cm.  She had significant leg twitching during both studies in keeping with PLMS but no significant arousals from Donna leg movements.  She is compliant with treatment.  She has adapted well with CPAP therapy.  She endorses improvement in Donna daytime somnolence.  AHI is at goal at 0.4/h on average in the past month, pressure at 10 cm.  Leak is on the low side as well, Donna mask seal is very good.  She is highly commended for Donna treatment adherence and she is motivated to continue with treatment.  She is also advised to continue with weight loss endeavors.  She has noticed restless leg symptoms.  She has a family history of RLS.  We will continue to monitor Donna symptoms and she is advised that restless leg symptoms and leg twitching can improve in patients with sleep apnea on CPAP therapy.  We can consider medication for symptomatic treatment of Donna RLS in the future if the need arises.  I think it is worth waiting things out a little longer.  She is encouraged to work on Donna sleep time and work on weight loss.  She is advised to follow-up  routinely in 6 months, sooner if needed.  I answered all Donna questions today and she was in agreement with the plan.  We reviewed Donna sleep study results in detail as well as Donna compliance data today.   I spent 30 minutes in total face-to-face time and in reviewing records during pre-charting, more than 50% of which was spent in counseling and coordination of care, reviewing test results, reviewing medications and treatment regimen and/or in discussing or reviewing the diagnosis of OSA, RLS, the prognosis and treatment options. Pertinent laboratory and imaging test results that were available during this visit with the patient were reviewed by me and considered in my medical decision making (see chart for details).

## 2020-09-24 MED FILL — FLOVENT HFA 110 MCG INHALER: 110 | 60 days supply | Qty: 12 | Fill #2

## 2020-09-27 ENCOUNTER — Other Ambulatory Visit: Payer: Self-pay | Admitting: Family Medicine

## 2020-09-27 NOTE — Telephone Encounter (Signed)
Pt confirmed that she has enough until appt on Monday. Will refill at that time.

## 2020-09-28 DIAGNOSIS — Z6839 Body mass index (BMI) 39.0-39.9, adult: Secondary | ICD-10-CM | POA: Diagnosis not present

## 2020-09-28 DIAGNOSIS — N939 Abnormal uterine and vaginal bleeding, unspecified: Secondary | ICD-10-CM | POA: Diagnosis not present

## 2020-09-28 DIAGNOSIS — R1032 Left lower quadrant pain: Secondary | ICD-10-CM | POA: Diagnosis not present

## 2020-09-28 DIAGNOSIS — Z01419 Encounter for gynecological examination (general) (routine) without abnormal findings: Secondary | ICD-10-CM | POA: Diagnosis not present

## 2020-09-28 LAB — HM PAP SMEAR: HM Pap smear: NORMAL

## 2020-09-28 LAB — RESULTS CONSOLE HPV: CHL HPV: NEGATIVE

## 2020-09-28 MED FILL — ESTRADIOL 2 MG TABS: 2 | 30 days supply | Qty: 30 | Fill #0

## 2020-09-28 MED FILL — PROGESTERONE 200 MG CAPS: 200 | 30 days supply | Qty: 30 | Fill #0

## 2020-10-01 DIAGNOSIS — G4733 Obstructive sleep apnea (adult) (pediatric): Secondary | ICD-10-CM | POA: Diagnosis not present

## 2020-10-03 ENCOUNTER — Other Ambulatory Visit: Payer: Self-pay

## 2020-10-03 ENCOUNTER — Encounter: Payer: Self-pay | Admitting: Family Medicine

## 2020-10-03 ENCOUNTER — Other Ambulatory Visit: Payer: Self-pay | Admitting: Family Medicine

## 2020-10-03 ENCOUNTER — Ambulatory Visit (INDEPENDENT_AMBULATORY_CARE_PROVIDER_SITE_OTHER): Payer: 59 | Admitting: Family Medicine

## 2020-10-03 VITALS — BP 104/65 | HR 65 | Temp 97.5°F | Resp 16 | Ht 64.0 in | Wt 235.8 lb

## 2020-10-03 DIAGNOSIS — Z0000 Encounter for general adult medical examination without abnormal findings: Secondary | ICD-10-CM

## 2020-10-03 DIAGNOSIS — R7303 Prediabetes: Secondary | ICD-10-CM | POA: Diagnosis not present

## 2020-10-03 DIAGNOSIS — Z1231 Encounter for screening mammogram for malignant neoplasm of breast: Secondary | ICD-10-CM | POA: Diagnosis not present

## 2020-10-03 DIAGNOSIS — E7800 Pure hypercholesterolemia, unspecified: Secondary | ICD-10-CM | POA: Diagnosis not present

## 2020-10-03 LAB — COMPREHENSIVE METABOLIC PANEL
ALT: 15 U/L (ref 0–35)
AST: 16 U/L (ref 0–37)
Albumin: 4 g/dL (ref 3.5–5.2)
Alkaline Phosphatase: 57 U/L (ref 39–117)
BUN: 12 mg/dL (ref 6–23)
CO2: 27 mEq/L (ref 19–32)
Calcium: 9.1 mg/dL (ref 8.4–10.5)
Chloride: 106 mEq/L (ref 96–112)
Creatinine, Ser: 0.73 mg/dL (ref 0.40–1.20)
GFR: 90.48 mL/min (ref >60.00)
Glucose, Bld: 97 mg/dL (ref 70–99)
Potassium: 4.2 mEq/L (ref 3.5–5.1)
Sodium: 140 mEq/L (ref 135–145)
Total Bilirubin: 0.5 mg/dL (ref 0.2–1.2)
Total Protein: 6.8 g/dL (ref 6.0–8.3)

## 2020-10-03 LAB — CBC WITH DIFFERENTIAL/PLATELET
Basophils Absolute: 0 10*3/uL (ref 0.0–0.1)
Basophils Relative: 0.8 % (ref 0.0–3.0)
Eosinophils Absolute: 0.2 10*3/uL (ref 0.0–0.7)
Eosinophils Relative: 3.8 % (ref 0.0–5.0)
HCT: 39 % (ref 36.0–46.0)
Hemoglobin: 12.8 g/dL (ref 12.0–15.0)
Lymphocytes Relative: 23 % (ref 12.0–46.0)
Lymphs Abs: 1.2 10*3/uL (ref 0.7–4.0)
MCHC: 33 g/dL (ref 30.0–36.0)
MCV: 88.3 fl (ref 78.0–100.0)
Monocytes Absolute: 0.4 10*3/uL (ref 0.1–1.0)
Monocytes Relative: 8.5 % (ref 3.0–12.0)
Neutro Abs: 3.2 10*3/uL (ref 1.4–7.7)
Neutrophils Relative %: 63.9 % (ref 43.0–77.0)
Platelets: 207 10*3/uL (ref 150.0–400.0)
RBC: 4.41 Mil/uL (ref 3.87–5.11)
RDW: 13.5 % (ref 11.5–15.5)
WBC: 5 10*3/uL (ref 4.0–10.5)

## 2020-10-03 LAB — LIPID PANEL
Cholesterol: 181 mg/dL (ref 0–200)
HDL: 58 mg/dL (ref >39.00)
LDL Cholesterol: 102 mg/dL — ABNORMAL HIGH (ref 0–99)
NonHDL: 122.57
Total CHOL/HDL Ratio: 3
Triglycerides: 104 mg/dL (ref 0.0–149.0)
VLDL: 20.8 mg/dL (ref 0.0–40.0)

## 2020-10-03 LAB — HEMOGLOBIN A1C: Hgb A1c MFr Bld: 6 % (ref 4.6–6.5)

## 2020-10-03 LAB — TSH: TSH: 2.47 u[IU]/mL (ref 0.35–4.50)

## 2020-10-03 MED ORDER — BUPROPION HCL ER (XL) 150 MG PO TB24
150.0000 mg | ORAL_TABLET | Freq: Every day | ORAL | 3 refills | Status: DC
Start: 2020-10-03 — End: 2020-10-03

## 2020-10-03 MED FILL — buPROPion HCL ER (XL) 150 M: 150 | 90 days supply | Qty: 90 | Fill #0

## 2020-10-03 NOTE — Progress Notes (Signed)
OFFICE VISIT  10/03/2020  CC:  Chief Complaint  Patient presents with  . Annual Exam    pt is fasting    HPI:    Patient is a 59 y.o. Caucasian female who presents for annual health maintenance exam and f/u prediabetes and GAD/MDD. A/P as of last visit: "1) Prediabetes and hypercholesterolemia and obesity: great TLC and wt loss over the last year. Congratulated pt, encouraged her to keep up the great work. Repeat A1c and FLP today as well as CMET and CBC.  2) GAD/MDD: stable/improved. No change in fluoxetine or bupropion at this time.  3) Excessive daytime sleepiness: ? Snoring, ? Apneic events in sleep. REfer to guilf neuro for consideration of eval for OSA."  INTERIM HX: Feeling well, no complaints. Dx'd with OSA since I last saw her, is now on CPAP. Doing better from this standpoint.  Working on lower fats/carbs, working on [employer] exercise. Compliant with metformin 500 mg qhs.  Anx/mood all stable on fluox and welbutrin.   Past Medical History:  Diagnosis Date  . Abdominal wall hernia   . Allergic rhinitis   . Atypical chest pain 08/2012   Normal Myoview 09/2012  . Borderline hyperlipidemia 05/2018; 08/2019   2020 Fr[place]ham CV risk 2%  . Contact dermatitis    poison ivy/oak  . Daytime sleepiness   . Epigastric hernia 04/2019   Repaired 06/26/19  . GERD (gastroesophageal reflux disease)   . Hay fever    seasonal  . Hernia   . 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 Interstitial cystitis    Dr. Rosana Hoes: hasn't seen him in yrs or so, was on elmiron and coldn't tell much difference.  . MDD (major depressive disorder)   . Mild persistent asthma 08/31/2013   [place] pulm: stable as of pulm f/u 02/2018 and 05/2019: no med changes made---1 yr f/u recommended.  . OSA on CPAP 07/2020  . Personal history of  adenomatous colonic polyp 10/28/2012   10/2012 - 4x10 mm tubular adenoma; repeat 09/03/16 showed NO POLYPS.  Recall 5 yrs (08/2021).  . Prediabetes 08/2019    08/20/19 HbA1c 6.4%->recommended metformin at that time.    Past Surgical History:  Procedure Laterality Date  . CARDIOVASCULAR STRESS TEST  09/2012   Rest/Stress myoview: normal  . CESAREAN SECTION  1988  . CHOLECYSTECTOMY  2002  . COLONOSCOPY W/ POLYPECTOMY  10/22/12; 09/03/16   2013; tubular adenoma, no high grade dysplasia (Dr. Carlean Purl).  Repeat 08/2016: no polyps.  Recall 5 yrs (08/2021--Dr. Carlean Purl).  . CPAP  07/2020  . CYSTOSCOPY     with bladder bx 07/22/18-->  . EPIGASTRIC HERNIA REPAIR N/A 06/26/2019   Procedure: LAPAROSCOPIC EPIGASTRIC HERNIA REPAIR WITH MESH;  Surgeon: Alphonsa Overall, MD;  Location: [facility];  Service: General;  Laterality: N/A;  . HERNIA REPAIR  [id number]   umbilical, during lap chole  . TONSILLECTOMY AND ADENOIDECTOMY  1974  . UMBILICAL HERNIA REPAIR  08/19/2012   Procedure: LAPAROSCOPIC UMBILICAL HERNIA;  Surgeon: Shann Medal, MD;  Location: [facility];  Service: General;  Laterality: N/A;    Outpatient Medications Prior to Visit  Medication Sig Dispense Refill  . acetaminophen (TYLENOL) 500 MG tablet Take 1,000 mg by mouth every 6 (six) hours as needed for moderate pain.    Marland Kitchen estradiol (ESTRACE) 2 MG tablet Take 2 mg by mouth daily.    . famotidine (PEPCID) 20 MG tablet Take 20 mg by mouth as needed for heartburn or indigestion.    Marland Kitchen FLUoxetine (PROZAC)  20 MG capsule TAKE 1 CAPSULE BY MOUTH ONCE DAILY. **APPOINTMENT NEEDED** 90 capsule 1  . fluticasone (FLOVENT HFA) 110 MCG/ACT inhaler Inhale 2 puffs into the lungs daily. 1 Inhaler 12  . Ibuprofen (MOTRIN PO) Take by mouth as needed.    . metFORMIN (GLUCOPHAGE) 500 MG tablet TAKE 1 TABLET BY MOUTH ONCE DAILY AT SUPPERTIME (APPT NEEDED) 30 tablet 5  . progesterone (PROMETRIUM) 200 MG capsule Take 200 mg by mouth at bedtime.    Marland Kitchen buPROPion (WELLBUTRIN XL) 150 MG 24 hr tablet TAKE 1 TABLET BY MOUTH ONCE DAILY 90 tablet 0  . diclofenac sodium (VOLTAREN) 1 % GEL Apply 4 g topically 4 (four) times daily. To affected  joint. (Patient not taking: Reported on 10/03/2020) 100 g 11  . triamcinolone (NASACORT) 55 MCG/ACT AERO nasal inhaler Place 1 spray into the nose daily as needed (allergies).  (Patient not taking: Reported on 10/03/2020)    . VENTOLIN HFA 108 (90 Base) MCG/ACT inhaler INHALE 2 PUFFS INTO THE LUNGS EVERY 6 HOURS AS NEEDED FOR WHEEZING. (Patient not taking: Reported on 10/03/2020) 18 g 2   No facility-administered medications prior to visit.    Allergies  Allergen Reactions  . Ciprofloxacin Itching    Review of Systems  Constitutional: Negative for appetite change, chills, fatigue and fever.  HENT: Negative for congestion, dental problem, ear pain and sore throat.   Eyes: Negative for discharge, redness and visual disturbance.  Respiratory: Negative for cough, chest tightness, shortness of breath and wheezing.   Cardiovascular: Negative for chest pain, palpitations and leg swelling.  Gastrointestinal: Negative for abdominal pain, blood in stool, diarrhea, nausea and vomiting.  Genitourinary: Negative for difficulty urinating, dysuria, flank pain, frequency, hematuria and urgency.  Musculoskeletal: Negative for arthralgias, back pain, joint swelling, myalgias and neck stiffness.  Skin: Negative for pallor and rash.  Neurological: Negative for dizziness, speech difficulty, weakness and headaches.  Hematological: Negative for adenopathy. Does not bruise/bleed easily.  Psychiatric/Behavioral: Negative for confusion and sleep disturbance. The patient is not nervous/anxious.     PE: Vitals with BMI 10/03/2020 09/22/2020 07/25/2020  Height 5\' 4"  5\' 5"  5\' 5"   Weight 235 lbs 13 oz 236 lbs 230 lbs  BMI 40.45 67.34 19.37  Systolic 902 409 735  Diastolic 65 69 77  Pulse 65 66 64   Exam chaperoned by Shepard General, CMA  Gen: Alert, well appearing.  Patient is oriented to person, place, time, and situation. AFFECT: pleasant, lucid thought and speech. ENT: Ears: EACs clear, normal epithelium.   TMs with good light reflex and landmarks bilaterally.  Eyes: no injection, icteris, swelling, or exudate.  EOMI, PERRLA. Nose: no drainage or turbinate edema/swelling.  No injection or focal lesion.  Mouth: lips without lesion/swelling.  Oral mucosa pink and moist.  Dentition intact and without obvious caries or gingival swelling.  Oropharynx without erythema, exudate, or swelling.  Neck: supple/nontender.  No LAD, mass, or TM.  Carotid pulses 2+ bilaterally, without bruits. CV: RRR, no m/r/g.   LUNGS: CTA bilat, nonlabored resps, good aeration in all lung fields. ABD: soft, NT, ND, BS normal.  No hepatospenomegaly or mass.  No bruits. EXT: no clubbing, cyanosis, or edema.  Musculoskeletal: no joint swelling, erythema, warmth, or tenderness.  ROM of all joints intact. Skin - no sores or suspicious lesions or rashes or color changes    LABS:  Lab Results  Component Value Date   TSH 1.70 08/20/2019   Lab Results  Component Value Date   WBC  4.6 04/14/2020   HGB 13.5 04/14/2020   HCT 40.5 04/14/2020   MCV 88.8 04/14/2020   PLT 228.0 04/14/2020   Lab Results  Component Value Date   CREATININE 0.68 04/14/2020   BUN 18 04/14/2020   NA 139 04/14/2020   K 4.4 04/14/2020   CL 103 04/14/2020   CO2 29 04/14/2020   Lab Results  Component Value Date   ALT 26 04/14/2020   AST 20 04/14/2020   ALKPHOS 60 04/14/2020   BILITOT 0.5 04/14/2020   Lab Results  Component Value Date   CHOL 228 (H) 04/14/2020   Lab Results  Component Value Date   HDL 65.00 04/14/2020   Lab Results  Component Value Date   LDLCALC 145 (H) 04/14/2020   Lab Results  Component Value Date   TRIG 94.0 04/14/2020   Lab Results  Component Value Date   CHOLHDL 4 04/14/2020   Lab Results  Component Value Date   HGBA1C 6.3 04/14/2020   Lab Results  Component Value Date   HGBA1C 6.3 04/14/2020    IMPRESSION AND PLAN:  1) Prediabetes: working on TLC. Cont metformin 500 mg qhs. Hba1c and fasting  glucose today.  2) Anxiety and depression: stable. Continue fluox 20 mg qd and wellbutrin xl 150mg  qd.  3) Health maintenance exam: Reviewed age and gender appropriate health maintenance issues (prudent diet, regular exercise, health risks of tobacco and excessive alcohol, use of seatbelts, fire alarms in home, use of sunscreen).  Also reviewed age and gender appropriate health screening as well as vaccine recommendations. Vaccines:  Flu->UTD.  Otherwise ALL UTD. Labs: fasting HP + a1c. Cervical ca screening: got pap last week at GYN. Breast ca screening:  Last in EMR is from 2019->per [facility]->scheduled for Feb 2022. Colon ca screening: hx of polyps, recall 08/2021.  An After Visit Summary was printed and given to the patient.  FOLLOW UP: Return in about 6 months (around 04/02/2021) for routine chronic illness f/u.  Signed:  Crissie Sickles, MD           10/03/2020

## 2020-10-03 NOTE — Patient Instructions (Signed)
Health Maintenance, Female Adopting a healthy lifestyle and getting preventive care are important in promoting health and wellness. Ask your health care provider about:  The right schedule for you to have regular tests and exams.  Things you can do on your own to prevent diseases and keep yourself healthy. What should I know about diet, weight, and exercise? Eat a healthy diet   Eat a diet that includes plenty of vegetables, fruits, low-fat dairy products, and lean protein.  Do not eat a lot of foods that are high in solid fats, added sugars, or sodium. Maintain a healthy weight Body mass index (BMI) is used to identify weight problems. It estimates body fat based on height and weight. Your health care provider can help determine your BMI and help you achieve or maintain a healthy weight. Get regular exercise Get regular exercise. This is one of the most important things you can do for your health. Most adults should:  Exercise for at least 150 minutes each week. The exercise should increase your heart rate and make you sweat (moderate-intensity exercise).  Do strengthening exercises at least twice a week. This is in addition to the moderate-intensity exercise.  Spend less time sitting. Even light physical activity can be beneficial. Watch cholesterol and blood lipids Have your blood tested for lipids and cholesterol at 58 years of age, then have this test every 5 years. Have your cholesterol levels checked more often if:  Your lipid or cholesterol levels are high.  You are older than 58 years of age.  You are at high risk for heart disease. What should I know about cancer screening? Depending on your health history and family history, you may need to have cancer screening at various ages. This may include screening for:  Breast cancer.  Cervical cancer.  Colorectal cancer.  Skin cancer.  Lung cancer. What should I know about heart disease, diabetes, and high blood  pressure? Blood pressure and heart disease  High blood pressure causes heart disease and increases the risk of stroke. This is more likely to develop in people who have high blood pressure readings, are of African descent, or are overweight.  Have your blood pressure checked: ? Every 3-5 years if you are 18-39 years of age. ? Every year if you are 40 years old or older. Diabetes Have regular diabetes screenings. This checks your fasting blood sugar level. Have the screening done:  Once every three years after age 40 if you are at a normal weight and have a low risk for diabetes.  More often and at a younger age if you are overweight or have a high risk for diabetes. What should I know about preventing infection? Hepatitis B If you have a higher risk for hepatitis B, you should be screened for this virus. Talk with your health care provider to find out if you are at risk for hepatitis B infection. Hepatitis C Testing is recommended for:  Everyone born from 1945 through 1965.  Anyone with known risk factors for hepatitis C. Sexually transmitted infections (STIs)  Get screened for STIs, including gonorrhea and chlamydia, if: ? You are sexually active and are younger than 58 years of age. ? You are older than 58 years of age and your health care provider tells you that you are at risk for this type of infection. ? Your sexual activity has changed since you were last screened, and you are at increased risk for chlamydia or gonorrhea. Ask your health care provider if   you are at risk.  Ask your health care provider about whether you are at high risk for HIV. Your health care provider may recommend a prescription medicine to help prevent HIV infection. If you choose to take medicine to prevent HIV, you should first get tested for HIV. You should then be tested every 3 months for as long as you are taking the medicine. Pregnancy  If you are about to stop having your period (premenopausal) and  you may become pregnant, seek counseling before you get pregnant.  Take 400 to 800 micrograms (mcg) of folic acid every day if you become pregnant.  Ask for birth control (contraception) if you want to prevent pregnancy. Osteoporosis and menopause Osteoporosis is a disease in which the bones lose minerals and strength with aging. This can result in bone fractures. If you are 65 years old or older, or if you are at risk for osteoporosis and fractures, ask your health care provider if you should:  Be screened for bone loss.  Take a calcium or vitamin D supplement to lower your risk of fractures.  Be given hormone replacement therapy (HRT) to treat symptoms of menopause. Follow these instructions at home: Lifestyle  Do not use any products that contain nicotine or tobacco, such as cigarettes, e-cigarettes, and chewing tobacco. If you need help quitting, ask your health care provider.  Do not use street drugs.  Do not share needles.  Ask your health care provider for help if you need support or information about quitting drugs. Alcohol use  Do not drink alcohol if: ? Your health care provider tells you not to drink. ? You are pregnant, may be pregnant, or are planning to become pregnant.  If you drink alcohol: ? Limit how much you use to 0-1 drink a day. ? Limit intake if you are breastfeeding.  Be aware of how much alcohol is in your drink. In the [place] one drink equals one 12 oz bottle of beer (355 mL), one 5 oz glass of wine (148 mL), or one 1 oz glass of hard liquor (44 mL). General instructions  Schedule regular health, dental, and eye exams.  Stay current with your vaccines.  Tell your health care provider if: ? You often feel depressed. ? You have ever been abused or do not feel safe at home. Summary  Adopting a healthy lifestyle and getting preventive care are important in promoting health and wellness.  Follow your health care provider's instructions about healthy  diet, exercising, and getting tested or screened for diseases.  Follow your health care provider's instructions on monitoring your cholesterol and blood pressure. This information is not intended to replace advice given to you by your health care provider. Make sure you discuss any questions you have with your health care provider. Document Revised: 10/22/2018 Document Reviewed: 10/22/2018 Elsevier Patient Education  2020 Elsevier Inc.  

## 2020-10-14 ENCOUNTER — Encounter: Payer: 59 | Admitting: Family Medicine

## 2020-10-14 MED FILL — METFORMIN HCL 500 MG TABS: 500 | 30 days supply | Qty: 30 | Fill #5

## 2020-10-17 ENCOUNTER — Other Ambulatory Visit: Payer: Self-pay | Admitting: Family Medicine

## 2020-10-17 MED FILL — FLUoxetine HCL 20 MG CAPS: 20 | 90 days supply | Qty: 90 | Fill #0

## 2020-10-21 ENCOUNTER — Other Ambulatory Visit: Payer: Self-pay

## 2020-10-22 MED FILL — ESTRADIOL 2 MG TABS: 2 | 30 days supply | Qty: 30 | Fill #1

## 2020-10-22 MED FILL — PROGESTERONE 200 MG CAPS: 200 | 30 days supply | Qty: 30 | Fill #1

## 2020-10-31 DIAGNOSIS — G4733 Obstructive sleep apnea (adult) (pediatric): Secondary | ICD-10-CM | POA: Diagnosis not present

## 2020-11-03 DIAGNOSIS — G4733 Obstructive sleep apnea (adult) (pediatric): Secondary | ICD-10-CM | POA: Diagnosis not present

## 2020-11-14 ENCOUNTER — Other Ambulatory Visit: Payer: Self-pay | Admitting: Family Medicine

## 2020-11-14 DIAGNOSIS — H43393 Other vitreous opacities, bilateral: Secondary | ICD-10-CM | POA: Diagnosis not present

## 2020-11-14 MED FILL — METFORMIN HCL 500 MG TABS: 500 | 30 days supply | Qty: 30 | Fill #0

## 2020-11-21 MED FILL — PROGESTERONE 200 MG CAPS: 200 | 30 days supply | Qty: 30 | Fill #2

## 2020-11-21 MED FILL — ESTRADIOL 2 MG TABS: 2 | 30 days supply | Qty: 30 | Fill #2

## 2020-11-23 MED FILL — FLOVENT HFA 110 MCG INHALER: 110 | 60 days supply | Qty: 12 | Fill #3

## 2020-12-11 MED FILL — METFORMIN HCL 500 MG TABS: 500 | 30 days supply | Qty: 30 | Fill #1

## 2020-12-19 DIAGNOSIS — H43393 Other vitreous opacities, bilateral: Secondary | ICD-10-CM | POA: Diagnosis not present

## 2020-12-21 MED FILL — ESTRADIOL 2 MG TABS: 2 | 30 days supply | Qty: 30 | Fill #3

## 2020-12-21 MED FILL — PROGESTERONE 200 MG CAPS: 200 | 30 days supply | Qty: 30 | Fill #3

## 2020-12-23 ENCOUNTER — Other Ambulatory Visit (HOSPITAL_COMMUNITY): Payer: Self-pay | Admitting: Obstetrics & Gynecology

## 2020-12-23 DIAGNOSIS — N951 Menopausal and female climacteric states: Secondary | ICD-10-CM | POA: Diagnosis not present

## 2020-12-23 DIAGNOSIS — Z1231 Encounter for screening mammogram for malignant neoplasm of breast: Secondary | ICD-10-CM | POA: Diagnosis not present

## 2020-12-23 LAB — HM MAMMOGRAPHY

## 2020-12-23 MED FILL — AMOX-CLAV 500-125 MG TABLET: 500-125 | 7 days supply | Qty: 14 | Fill #0

## 2020-12-26 MED FILL — buPROPion HCL ER (XL) 150 M: 150 | 90 days supply | Qty: 90 | Fill #1

## 2021-01-09 MED FILL — FLUoxetine HCL 20 MG CAPS: 20 | 90 days supply | Qty: 90 | Fill #1

## 2021-01-09 MED FILL — METFORMIN HCL 500 MG TABS: 500 | 30 days supply | Qty: 30 | Fill #2

## 2021-01-20 ENCOUNTER — Other Ambulatory Visit (HOSPITAL_COMMUNITY): Payer: Self-pay | Admitting: Obstetrics & Gynecology

## 2021-01-20 MED FILL — ESTRADIOL 2 MG TABS: 2 | 90 days supply | Qty: 90 | Fill #0

## 2021-01-20 MED FILL — PROGESTERONE 200 MG CAPS: 200 | 30 days supply | Qty: 30 | Fill #0

## 2021-01-22 MED FILL — FLOVENT HFA 110 MCG INHALER: 110 | 60 days supply | Qty: 12 | Fill #4

## 2021-02-03 ENCOUNTER — Other Ambulatory Visit (HOSPITAL_BASED_OUTPATIENT_CLINIC_OR_DEPARTMENT_OTHER): Payer: Self-pay

## 2021-02-06 MED FILL — METFORMIN HCL 500 MG TABS: 500 | 30 days supply | Qty: 30 | Fill #3

## 2021-02-07 ENCOUNTER — Other Ambulatory Visit (HOSPITAL_BASED_OUTPATIENT_CLINIC_OR_DEPARTMENT_OTHER): Payer: Self-pay

## 2021-02-07 ENCOUNTER — Encounter: Payer: Self-pay | Admitting: Physician Assistant

## 2021-02-07 ENCOUNTER — Telehealth: Payer: 59 | Admitting: Physician Assistant

## 2021-02-07 DIAGNOSIS — W5501XA Bitten by cat, initial encounter: Secondary | ICD-10-CM

## 2021-02-07 DIAGNOSIS — S51851A Open bite of right forearm, initial encounter: Secondary | ICD-10-CM

## 2021-02-07 MED ORDER — AMOXICILLIN-POT CLAVULANATE 875-125 MG PO TABS
1.0000 | ORAL_TABLET | Freq: Two times a day (BID) | ORAL | 0 refills | Status: DC
  Filled 2021-02-07: qty 20, 10d supply, fill #0

## 2021-02-07 NOTE — Patient Instructions (Signed)
Instructions sent to patient's Mychart.

## 2021-02-07 NOTE — Progress Notes (Signed)
Donna Willis, Donna Willis are scheduled for a virtual visit with your provider today.    Just as we do with appointments in the office, we must obtain your consent to participate.  Your consent will be active for this visit and any virtual visit you may have with one of our providers in the next 365 days.    If you have a MyChart account, I can also send a copy of this consent to you electronically.  All virtual visits are billed to your insurance company just like a traditional visit in the office.  As this is a virtual visit, video technology does not allow for your provider to perform a traditional examination.  This may limit your provider's ability to fully assess your condition.  If your provider identifies any concerns that need to be evaluated in person or the need to arrange testing such as labs, EKG, etc, we will make arrangements to do so.    Although advances in technology are sophisticated, we cannot ensure that it will always work on either your end or our end.  If the connection with a video visit is poor, we may have to switch to a telephone visit.  With either a video or telephone visit, we are not always able to ensure that we have a secure connection.   I need to obtain your verbal consent now.   Are you willing to proceed with your visit today?   Donna Willis has provided verbal consent on 02/07/2021 for a virtual visit (video or telephone).   Leeanne Rio, PA-C 02/07/2021  2:27 PM     Virtual Visit via Video   I connected with patient on 02/07/21 at  2:30 PM EDT by a video enabled telemedicine application and verified that I am speaking with the correct person using two identifiers.  Location patient: Home Location provider: [facility] participating in the virtual visit: Patient, Provider  I discussed the limitations of evaluation and management by telemedicine and the availability of in person appointments. The patient expressed understanding and  agreed to proceed.  Subjective:   HPI:   Patient presents via [place] today c/o cat bite to her R forearm occurring last night. Notes she was playing with her pet cat and had stopped. He then jumped up and bit her on the arm. Notes cleaning the area in a timely manner. Notes area is red, swollen and tender to the touch. Denies fever, malaise. Td up-to-date (04/2016). Her cat is up-to-date on vaccinations and has not been displaying any new behaviors.    ROS:   See pertinent positives and negatives per HPI.  Patient Active Problem List   Diagnosis Date Noted  . Effusion, right knee 07/26/2020  . Prediabetes 12/15/2019  . Incarcerated ventral hernia 06/26/2019  . MDD (major depressive disorder)   . Chronic interstitial cystitis 06/06/2018  . Lateral epicondylitis of right elbow 01/21/2015  . Right elbow pain 12/28/2014  . Asthma 08/11/2014  . Chronic cough 08/31/2013  . Mild intermittent asthma in adult without complication 72/53/6644  . Allergic rhinitis 08/31/2013  . 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 History of colonic polyps 10/28/2012  . Health maintenance examination 09/25/2012  . Incarcerated umbilical hernia, repaired 08/19/2012. 08/18/2012    Social History   Tobacco Use  . Smoking status: Never Smoker  . Smokeless tobacco: Never Used  Substance Use Topics  . Alcohol use: Yes    Comment: rare    Current Outpatient Medications:  .  acetaminophen (TYLENOL) 500 MG tablet, Take 1,000 mg by mouth every 6 (six) hours as needed for moderate pain., Disp: , Rfl:  .  buPROPion (WELLBUTRIN XL) 150 MG 24 hr tablet, Take 1 tablet (150 mg total) by mouth daily., Disp: 90 tablet, Rfl: 3 .  diclofenac sodium (VOLTAREN) 1 % GEL, Apply 4 g topically 4 (four) times daily. To affected joint. (Patient not taking: Reported on 10/03/2020), Disp: 100 g, Rfl: 11 .  estradiol (ESTRACE) 2 MG tablet, Take 2 mg by mouth daily., Disp: , Rfl:  .  famotidine (PEPCID) 20 MG tablet, Take 20  mg by mouth as needed for heartburn or indigestion., Disp: , Rfl:  .  FLUoxetine (PROZAC) 20 MG capsule, Take 1 capsule (20 mg total) by mouth daily., Disp: 90 capsule, Rfl: 1 .  fluticasone (FLOVENT HFA) 110 MCG/ACT inhaler, Inhale 2 puffs into the lungs daily., Disp: 1 Inhaler, Rfl: 12 .  Ibuprofen (MOTRIN PO), Take by mouth as needed., Disp: , Rfl:  .  metFORMIN (GLUCOPHAGE) 500 MG tablet, TAKE 1 TABLET BY MOUTH ONCE DAILY AT SUPPERTIME (APPT NEEDED), Disp: 30 tablet, Rfl: 5 .  progesterone (PROMETRIUM) 200 MG capsule, Take 200 mg by mouth at bedtime., Disp: , Rfl:  .  triamcinolone (NASACORT) 55 MCG/ACT AERO nasal inhaler, Place 1 spray into the nose daily as needed (allergies).  (Patient not taking: Reported on 10/03/2020), Disp: , Rfl:  .  VENTOLIN HFA 108 (90 Base) MCG/ACT inhaler, INHALE 2 PUFFS INTO THE LUNGS EVERY 6 HOURS AS NEEDED FOR WHEEZING. (Patient not taking: Reported on 10/03/2020), Disp: 18 g, Rfl: 2  Allergies  Allergen Reactions  . Ciprofloxacin Itching    Objective:   There were no vitals taken for this visit.  Patient is well-developed, well-nourished in no acute distress.  Resting comfortably at home.  Head is normocephalic, atraumatic.  No labored breathing.  Speech is clear and coherent with logical content.  Patient is alert and oriented at baseline.  R forearm examined with 4 puncture marks noted, each with a quarter-sized area of erythema surrounding it. No lymphatic streaking noted. No laceration noted.   Assessment and Plan:   1. Cat bite of right forearm, initial encounter Tetanus up-to-date. No laceration, just closed puncture wounds. No alarm signs/symptoms present. House cat who is UTD on vaccinations. No concern for rabies. Wound care discussed. Rx Augmenting. Strict PCP follow-up discussed. Written instructions sent to the patient's MyChart.     Leeanne Rio, [place] 02/07/2021

## 2021-02-28 DIAGNOSIS — G4733 Obstructive sleep apnea (adult) (pediatric): Secondary | ICD-10-CM | POA: Diagnosis not present

## 2021-03-03 ENCOUNTER — Other Ambulatory Visit (HOSPITAL_COMMUNITY): Payer: Self-pay

## 2021-03-03 MED FILL — Progesterone Cap 200 MG: ORAL | 90 days supply | Qty: 90 | Fill #0 | Status: AC

## 2021-03-23 ENCOUNTER — Ambulatory Visit: Payer: 59 | Admitting: Neurology

## 2021-04-03 ENCOUNTER — Ambulatory Visit: Payer: 59 | Admitting: Family Medicine

## 2021-04-03 NOTE — Progress Notes (Deleted)
OFFICE VISIT  04/03/2021  CC: No chief complaint on file.   HPI:    Patient is a 60 y.o. Caucasian female who presents for 6 mo f/u prediabetes and GAD/MDD. A/P as of last visit: "1) Prediabetes: working on TLC. Cont metformin 500 mg qhs. Hba1c and fasting glucose today.  2) Anxiety and depression: stable. Continue fluox 20 mg qd and wellbutrin xl 150mg  qd.  3) Health maintenance exam: Reviewed age and gender appropriate health maintenance issues (prudent diet, regular exercise, health risks of tobacco and excessive alcohol, use of seatbelts, fire alarms in home, use of sunscreen).  Also reviewed age and gender appropriate health screening as well as vaccine recommendations. Vaccines:  Flu->UTD.  Otherwise ALL UTD. Labs: fasting HP + a1c. Cervical ca screening: got pap last week at GYN. Breast ca screening:  Last in EMR is from 2019->per [facility]->scheduled for Feb 2022. Colon ca screening: hx of polyps, recall 08/2021."  INTERIM HX: ***    Past Medical History:  Diagnosis Date  . Abdominal wall hernia   . Allergic rhinitis   . Atypical chest pain 08/2012   Normal Myoview 09/2012  . Borderline hyperlipidemia 05/2018; 08/2019   2020 Framingham CV risk 2%  . Contact dermatitis    poison ivy/oak  . Daytime sleepiness   . Epigastric hernia 04/2019   Repaired 06/26/19  . GERD (gastroesophageal reflux disease)   . Hay fever    seasonal  . Hernia   . 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 Interstitial cystitis    Dr. Rosana Hoes: hasn't seen him in yrs or so, was on elmiron and coldn't tell much difference.  . MDD (major depressive disorder)   . Mild persistent asthma 08/31/2013   [place] pulm: stable as of pulm f/u 02/2018 and 05/2019: no med changes made---1 yr f/u recommended.  . OSA on CPAP 07/2020  . Personal history of  adenomatous colonic polyp 10/28/2012   10/2012 - 4x10 mm tubular adenoma; repeat 09/03/16 showed NO POLYPS.  Recall 5 yrs (08/2021).   . Prediabetes 08/2019   08/20/19 HbA1c 6.4%->recommended metformin at that time.    Past Surgical History:  Procedure Laterality Date  . CARDIOVASCULAR STRESS TEST  09/2012   Rest/Stress myoview: normal  . CESAREAN SECTION  1988  . CHOLECYSTECTOMY  2002  . COLONOSCOPY W/ POLYPECTOMY  10/22/12; 09/03/16   2013; tubular adenoma, no high grade dysplasia (Dr. Carlean Purl).  Repeat 08/2016: no polyps.  Recall 5 yrs (08/2021--Dr. Carlean Purl).  . CPAP  07/2020  . CYSTOSCOPY     with bladder bx 07/22/18-->  . EPIGASTRIC HERNIA REPAIR N/A 06/26/2019   Procedure: LAPAROSCOPIC EPIGASTRIC HERNIA REPAIR WITH MESH;  Surgeon: Alphonsa Overall, MD;  Location: [facility];  Service: General;  Laterality: N/A;  . HERNIA REPAIR  5093   umbilical, during lap chole  . TONSILLECTOMY AND ADENOIDECTOMY  1974  . UMBILICAL HERNIA REPAIR  08/19/2012   Procedure: LAPAROSCOPIC UMBILICAL HERNIA;  Surgeon: Shann Medal, MD;  Location: [facility];  Service: General;  Laterality: N/A;    Outpatient Medications Prior to Visit  Medication Sig Dispense Refill  . acetaminophen (TYLENOL) 500 MG tablet Take 1,000 mg by mouth every 6 (six) hours as needed for moderate pain.    Marland Kitchen buPROPion (WELLBUTRIN XL) 150 MG 24 hr tablet TAKE 1 TABLET BY MOUTH DAILY. 90 tablet 3  . Cinnamon 500 MG capsule Take 500 mg by mouth daily.    Marland Kitchen estradiol (ESTRACE) 2 MG tablet TAKE 1 TABLET BY  MOUTH ONCE A DAY 30 tablet 3  . estradiol (ESTRACE) 2 MG tablet TAKE 1 TABLET BY MOUTH ONCE A DAY 90 tablet 3  . famotidine (PEPCID) 20 MG tablet Take 20 mg by mouth as needed for heartburn or indigestion.    Marland Kitchen FLUoxetine (PROZAC) 20 MG capsule TAKE 1 CAPSULE BY MOUTH ONCE A DAY 90 capsule 1  . fluticasone (FLOVENT HFA) 110 MCG/ACT inhaler INHALE 2 PUFFS INTO THE LUNGS DAILY. 12 g 12  . Ibuprofen (MOTRIN PO) Take by mouth as needed.    . metFORMIN (GLUCOPHAGE) 500 MG tablet TAKE 1 TABLET BY MOUTH ONCE DAILY AT SUPPERTIME (APPT NEEDED) 30 tablet 5  . progesterone  (PROMETRIUM) 200 MG capsule TAKE 1 CAPSULE BY MOUTH AT BEDTIME 90 capsule 3  . amoxicillin-clavulanate (AUGMENTIN) 500-125 MG tablet TAKE 1 TABLET BY MOUTH 2 TIMES DAILY 14 tablet 0  . amoxicillin-clavulanate (AUGMENTIN) 875-125 MG tablet Take 1 tablet by mouth 2 (two) times daily. 20 tablet 0  . estradiol (ESTRACE) 2 MG tablet Take 2 mg by mouth daily.    . progesterone (PROMETRIUM) 200 MG capsule Take 200 mg by mouth at bedtime.    . progesterone (PROMETRIUM) 200 MG capsule TAKE 1 CAPSULE BY MOUTH AT BEDTIME 30 capsule 3   No facility-administered medications prior to visit.    Allergies  Allergen Reactions  . Ciprofloxacin Itching    ROS As per HPI  PE: Vitals with BMI 10/03/2020 09/22/2020 07/25/2020  Height 5\' 4"  5\' 5"  5\' 5"   Weight 235 lbs 13 oz 236 lbs 230 lbs  BMI 40.45 16.10 96.04  Systolic 540 981 191  Diastolic 65 69 77  Pulse 65 66 64     ***  LABS:  Lab Results  Component Value Date   TSH 2.47 10/03/2020   Lab Results  Component Value Date   WBC 5.0 10/03/2020   HGB 12.8 10/03/2020   HCT 39.0 10/03/2020   MCV 88.3 10/03/2020   PLT 207.0 10/03/2020   Lab Results  Component Value Date   CREATININE 0.73 10/03/2020   BUN 12 10/03/2020   NA 140 10/03/2020   K 4.2 10/03/2020   CL 106 10/03/2020   CO2 27 10/03/2020   Lab Results  Component Value Date   ALT 15 10/03/2020   AST 16 10/03/2020   ALKPHOS 57 10/03/2020   BILITOT 0.5 10/03/2020   Lab Results  Component Value Date   CHOL 181 10/03/2020   Lab Results  Component Value Date   HDL 58.00 10/03/2020   Lab Results  Component Value Date   LDLCALC 102 (H) 10/03/2020   Lab Results  Component Value Date   TRIG 104.0 10/03/2020   Lab Results  Component Value Date   CHOLHDL 3 10/03/2020   Lab Results  Component Value Date   HGBA1C 6.0 10/03/2020   IMPRESSION AND PLAN:  No problem-specific Assessment & Plan notes found for this encounter.   An After Visit Summary was printed and  given to the patient.  FOLLOW UP: No follow-ups on file.  Signed:  Crissie Sickles, MD           04/03/2021

## 2021-04-16 ENCOUNTER — Encounter: Payer: Self-pay | Admitting: Neurology

## 2021-04-18 ENCOUNTER — Encounter: Payer: Self-pay | Admitting: Neurology

## 2021-04-18 ENCOUNTER — Ambulatory Visit: Payer: 59 | Admitting: Neurology

## 2021-04-18 VITALS — BP 113/69 | HR 62 | Ht 65.0 in | Wt 247.0 lb

## 2021-04-18 DIAGNOSIS — G4733 Obstructive sleep apnea (adult) (pediatric): Secondary | ICD-10-CM

## 2021-04-18 DIAGNOSIS — F439 Reaction to severe stress, unspecified: Secondary | ICD-10-CM

## 2021-04-18 DIAGNOSIS — Z9989 Dependence on other enabling machines and devices: Secondary | ICD-10-CM | POA: Diagnosis not present

## 2021-04-18 NOTE — Patient Instructions (Signed)
It was good to see you again today.  I am glad you have adjusted well to CPAP therapy, please continue regularly with your machine, you are doing well with your compliance and we do not need to make any changes to your settings.  Please talk to your primary care physician about the possibility of increasing your Wellbutrin, it may help your daytime sleepiness as well as help with reducing the effects of day-to-day stress.  Please continue using your CPAP regularly. While your insurance requires that you use CPAP at least 4 hours each night on 70% of the nights, I recommend, that you not skip any nights and use it throughout the night if you can. Getting used to CPAP and staying with the treatment long term does take time and patience and discipline. Untreated obstructive sleep apnea when it is moderate to severe can have an adverse impact on cardiovascular health and raise her risk for heart disease, arrhythmias, hypertension, congestive heart failure, stroke and diabetes. Untreated obstructive sleep apnea causes sleep disruption, nonrestorative sleep, and sleep deprivation. This can have an impact on your day to day functioning and cause daytime sleepiness and impairment of cognitive function, memory loss, mood disturbance, and problems focussing. Using CPAP regularly can improve these symptoms.  Please follow-up routinely in sleep clinic in 1 year.  You can always call us or email [place] through [place] with interim questions or concerns.

## 2021-04-18 NOTE — Progress Notes (Signed)
Subjective:    Donna [name]: Donna Donna Willis is a 60 y.o. female.  HPI     Interim history:   Donna Donna Willis is a 60 year old right-handed woman with an underlying medical history of interstitial cystitis, prediabetes, reflux disease, depression, asthma, allergic rhinitis, histoplasmosis, and obesity, who presents for follow-up consultation of Donna Donna Willis obstructive sleep apnea, on CPAP therapy.  Donna Donna Willis is unaccompanied today. I last saw Donna Donna Willis on 09/22/20, at which time she was compliant with Donna Donna Willis CPAP and adjusting well to treatment.  She had some residual sleepiness but was also advised to make more time for sleep.  She reported interim stress.  She also had intermittent restless leg symptoms.  Today, 04/18/21: I reviewed Donna Donna Willis CPAP compliance data from 03/18/2021 through 04/16/2021, which is a total of 30 days, during which time she used Donna Donna Willis machine 29 days with percent use days greater than 4 hours at 87%, indicating very good compliance with an average usage of 6 hours and 23 minutes, residual AHI at goal at 0.3/h, leak acceptable with a 95th percentile at 8.1 L/min on a pressure of 10 cm with a PR of 3. She reports doing fairly well with Donna Donna Willis CPAP, when she skipped tonight because of sinus congestion she could really tell Donna next day.  Nevertheless, she still has some daytime somnolence, attributes this to a combination of factors.  She has been on Prozac 20 mg once daily.  When she increase it to 40 mg, she felt more sleepy.  She continues to take Wellbutrin long-acting 150 mg once daily.  She is not opposed to talking to Donna Donna Willis primary care physician about Donna possibility of increasing this medication.  She continues to have quite a bit of stress.  She reports that Donna Donna Willis husband had to be hospitalized, he has neuromuscular disease at has had aspiration and needs to be on a BiPAP which he does not like to use but since his hospitalization he has been using it more consistently.   Donna Donna Willis's allergies, current  medications, family history, past medical history, past social history, past surgical history and problem list were reviewed and updated as appropriate.    Previously:      I first met Donna Donna Willis at Donna request of Donna Donna Willis primary care physician on 05/09/2020, at which time she reported snoring and daytime somnolence.  She was advised to proceed with a sleep study.  She had a baseline sleep study, followed by a CPAP titration study.  Donna Donna Willis baseline sleep study from 05/27/2020 showed a sleep efficiency of 89%, sleep latency 1.5 minutes, REM latency delayed at 145 minutes.  She had a mildly increased percentage of stage II sleep.  She had a normal REM percentage at 20.4%.  Total AHI was in Donna moderate range at 26.8/h, REM AHI in Donna severe range at 66.4/h, supine AHI 47.1/h.  Average oxygen saturation was 94%, nadir was severe at 59% with time below 89% saturation of 192 minutes for Donna night.  She had a moderately increased PLM index at 31/h without significant arousals.  Mild to moderate snoring was noted, no significant EKG changes were noted.  She was advised to proceed with a second sleep study for proper titration with CPAP. She had a CPAP titration study on 06/26/2020.  Sleep efficiency was 86%, sleep latency 12 minutes, REM latency delayed at 162 minutes.  She was fitted with a medium DreamWear full facemask and CPAP was titrated from 5 cm to a final pressure of 10 cm.  On Donna Donna Willis  final titration pressure Donna Donna Willis AHI was 1.2/h, O2 nadir 91% with supine non-REM sleep achieved.  She had an increased percentage of stage II sleep and a reduced percentage of REM sleep at 12.3%.  She had severe PLM's at 62.7/h, without significant arousals.  She was advised to proceed with CPAP therapy at home, Donna Donna Willis set up date was 08/01/2020.   I reviewed Donna Donna Willis CPAP compliance data from 08/22/2020 through 09/20/2020, which is a total of 30 days with percent use days greater than 4 hours at 93%, indicating excellent compliance with an average usage of 6  hours and 37 minutes, residual AHI at goal at 0.4/h, leak on Donna low side with a 95th percentile at 5 L/min on a pressure of 10 cm with EPR of 3.    05/09/20: (She reports) excessive daytime somnolence, for Donna past several months, particularly worse over Donna past 2 to 3 months.  She has dozed off while working at Donna computer, she has also come close to falling asleep at Donna wheel.  She reports a family history of sleep apnea affecting Donna Donna Willis father and 2 brothers.  She does not know if she snores, Donna Donna Willis son has not mentioned it.  Donna Donna Willis husband had reported it in Donna past.  She has been working on weight loss.  She has lost about 40 pounds in Donna past several months.  She takes care of Donna Donna Willis husband who is disabled and Donna Donna Willis son helps take care of him at night.  She lives with Donna Donna Willis husband and Donna Donna Willis son.  She works as a Marine scientist for [employer], and incident reporting.  She is a non-smoker, drinks alcohol rarely, maybe a few times a year, and drinks caffeine in Donna form of coffee, 1 cup in Donna mornings about 3 days out of Donna week and 2 diet sodas per day on average.  She has nocturia about 2-3, sometimes as many as 5 times per night on average.  She has woken up with a headache at times.  She does have intermittent restless legs type symptoms.  I reviewed your office note from 04/14/2020.  Donna Donna Willis Epworth sleepiness score is 15 out of 24, fatigue severity score is 35 out of 63.  Bedtime is generally between 10 and 11 and rise time between 5 and 6.  She had a tonsillectomy and adenoidectomy around age 64 or 69.  She has 1 cat in Donna household.  She has a TV on in Donna Donna Willis bedroom typically all night.  She avoids caffeine after 2 or 3 PM.  Donna Donna Willis Past Medical History Is Significant For: Past Medical History:  Diagnosis Date  . Abdominal wall hernia   . Allergic rhinitis   . Atypical chest pain 08/2012   Normal Myoview 09/2012  . Borderline hyperlipidemia 05/2018; 08/2019   2020 Framingham CV risk 2%  . Contact dermatitis    poison  ivy/oak  . Daytime sleepiness   . Epigastric hernia 04/2019   Repaired 06/26/19  . GERD (gastroesophageal reflux disease)   . Hay fever    seasonal  . Hernia   . 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 Interstitial cystitis    Dr. Rosana Hoes: hasn't seen him in yrs or so, was on elmiron and coldn't tell much difference.  . MDD (major depressive disorder)   . Mild persistent asthma 08/31/2013   [place] pulm: stable as of pulm f/u 02/2018 and 05/2019: no med changes made---1 yr f/u recommended.  . OSA on CPAP 07/2020  . Personal history of  adenomatous colonic polyp 10/28/2012   10/2012 - 4x10 mm tubular adenoma; repeat 09/03/16 showed NO POLYPS.  Recall 5 yrs (08/2021).  . Prediabetes 08/2019   08/20/19 HbA1c 6.4%->recommended metformin at that time.    Donna Donna Willis Past Surgical History Is Significant For: Past Surgical History:  Procedure Laterality Date  . CARDIOVASCULAR STRESS TEST  09/2012   Rest/Stress myoview: normal  . CESAREAN SECTION  1988  . CHOLECYSTECTOMY  2002  . COLONOSCOPY W/ POLYPECTOMY  10/22/12; 09/03/16   2013; tubular adenoma, no high grade dysplasia (Dr. Carlean Purl).  Repeat 08/2016: no polyps.  Recall 5 yrs (08/2021--Dr. Carlean Purl).  . CPAP  07/2020  . CYSTOSCOPY     with bladder bx 07/22/18-->  . EPIGASTRIC HERNIA REPAIR N/A 06/26/2019   Procedure: LAPAROSCOPIC EPIGASTRIC HERNIA REPAIR WITH MESH;  Surgeon: Alphonsa Overall, MD;  Location: [facility];  Service: General;  Laterality: N/A;  . HERNIA REPAIR  8466   umbilical, during lap chole  . TONSILLECTOMY AND ADENOIDECTOMY  1974  . UMBILICAL HERNIA REPAIR  08/19/2012   Procedure: LAPAROSCOPIC UMBILICAL HERNIA;  Surgeon: Shann Medal, MD;  Location: [facility];  Service: General;  Laterality: N/A;    Donna Donna Willis Family History Is Significant For: Family History  Problem Relation Age of Onset  . Colon polyps Mother   . Diverticulitis Father   . Other Maternal Uncle        great-stom or colon cancer  . Colon cancer Maternal Uncle   .  Kidney failure Brother   . Kidney disease Brother   . Rectal cancer Neg Hx     Donna Donna Willis Social History Is Significant For: Social History   Socioeconomic History  . Marital status: Married    Spouse name: Not on file  . Number of children: Not on file  . Years of education: Not on file  . Highest education level: Not on file  Occupational History  . Not on file  Tobacco Use  . Smoking status: Never Smoker  . Smokeless tobacco: Never Used  Vaping Use  . Vaping Use: Never used  Substance and Sexual Activity  . Alcohol use: Yes    Comment: rare  . Drug use: No  . Sexual activity: Never    Birth control/protection: None  Other Topics Concern  . Not on file  Social History Narrative   Married, 1 son.   Orig from [place] but has lived in [place] >20 yrs.   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   Exercise: walks 40 min 3X/week.     No T/A/Ds   Social Determinants of Health   Financial Resource Strain: Not on file  Food Insecurity: Not on file  Transportation Needs: Not on file  Physical Activity: Not on file  Stress: Not on file  Social Connections: Not on file    Donna Donna Willis Allergies Are:  Allergies  Allergen Reactions  . Ciprofloxacin Itching  :   Donna Donna Willis Current Medications Are:  Outpatient Encounter Medications as of 04/18/2021  Medication Sig  . acetaminophen (TYLENOL) 500 MG tablet Take 1,000 mg by mouth every 6 (six) hours as needed for moderate pain.  Marland Kitchen buPROPion (WELLBUTRIN XL) 150 MG 24 hr tablet TAKE 1 TABLET BY MOUTH DAILY.  Marland Kitchen Cinnamon 500 MG capsule Take 500 mg by mouth daily.  Marland Kitchen estradiol (ESTRACE) 2 MG tablet TAKE 1 TABLET BY MOUTH ONCE A DAY  . famotidine (PEPCID) 20 MG tablet Take 20 mg by mouth as needed for  heartburn or indigestion.  Marland Kitchen FLUoxetine (PROZAC) 20 MG capsule TAKE 1 CAPSULE BY MOUTH ONCE A DAY  . fluticasone (FLOVENT HFA) 110 MCG/ACT inhaler INHALE 2 PUFFS INTO Donna LUNGS DAILY.  Marland Kitchen Ibuprofen (MOTRIN PO)  Take by mouth as needed.  . metFORMIN (GLUCOPHAGE) 500 MG tablet TAKE 1 TABLET BY MOUTH ONCE DAILY AT SUPPERTIME (APPT NEEDED)  . progesterone (PROMETRIUM) 200 MG capsule TAKE 1 CAPSULE BY MOUTH AT BEDTIME  . [DISCONTINUED] estradiol (ESTRACE) 2 MG tablet TAKE 1 TABLET BY MOUTH ONCE A DAY   No facility-administered encounter medications on file as of 04/18/2021.  :  Review of Systems:  Out of a complete 14 point review of systems, all are reviewed and negative with Donna exception of these symptoms as listed below: Review of Systems  Neurological:       RM 1, alone. OSA/CPAP follow up. Still having daytime sleepiness. Has only missed a couple days of using machine. Wearing machine a min of 6 hours.     Objective:  Neurological Exam  Physical Exam Physical Examination:   Vitals:   04/18/21 0726  BP: 113/69  Pulse: 62  SpO2: 98%    General Examination: Donna Donna Willis is a very pleasant 60 y.o. female in no acute distress. She appears well-developed and well-nourished and well groomed.   HEENT:Normocephalic, atraumatic, pupils are equal, round and reactive to light, extraocular tracking is good without limitation. No nystagmus noted. Hearing is grossly intact. Face is symmetric with normal facial animation. Speech is clear with no dysarthria noted. There is no hypophonia. There is no lip, neck/head, jaw or voice tremor. Neck is supple with full range of passive and active motion. There are no carotid bruits on auscultation. Oropharynx exam reveals: mildmouth dryness, adequatedental hygiene and mildairway crowding. Tongue protrudes centrally and palate elevates symmetrically. Tonsils areabsent.  Chest:Clear to auscultation without wheezing, rhonchi or crackles noted.  Heart:S1+S2+0, regular and normal without murmurs, rubs or gallops noted.   Abdomen:Soft, non-tender and non-distended.  Extremities:There istrace edema left ankle.     Skin: Warm and dry without trophic  changes noted.Scare from a cat bite L shin area, stable.  Musculoskeletal: exam reveals no obvious joint deformities, tenderness or joint swelling or erythema.   Neurologically:  Mental status: Donna Donna Willis is awake, alert and oriented in all 4 spheres.Herimmediate and remote memory, attention, language skills and fund of knowledge are appropriate. There is no evidence of aphasia, agnosia, apraxia or anomia. Speech is clear with normal prosody and enunciation. Thought process is linear. Mood is normaland affect is normal.  Cranial nerves II - XII are as described above under HEENT exam.  Motor exam: Normal bulk, strength and tone is noted. There is no tremor.Fine motor skills and coordination: grossly intact.  Cerebellar testing: No dysmetria or intention tremor. There is no truncal or gait ataxia.  Sensory exam: intact to light touch in Donna upper and lower extremities.  Gait, station and balance:Shestands easily. No veering to one side is noted. No leaning to one side is noted. Posture is age-appropriate and stance is narrow based. Gait showsnormalstride length and normalpace. No problems turning are noted.  Assessmentand Plan:  In summary,Abraham L Watsonis a very pleasant 58 year oldfemalewith an underlying medical history of interstitial cystitis, prediabetes, reflux disease, depression, asthma, allergic rhinitis, histoplasmosis, and obesity, who presents for follow-up consultation of Donna Donna Willis obstructive sleep apnea, established on CPAP therapy.  Donna Donna Willis baseline sleep study from 05/27/2020 showed moderate to severe obstructive sleep apnea with an AHI 26.8/h,  REM AHI of 66.4/h, supine AHI of 47.1/h and O2 nadir in Donna critical range at 59% during supine REM sleep.  She had a subsequent titration study on 06/26/2020, she did well with CPAP therapy of 10 cm.  She had significant leg twitching during both studies in keeping with PLMS but no significant arousals from Donna Donna Willis leg movements.  She is  compliant with treatment and adjusted well to CPAP therapy.  She does endorse improvement in Donna Donna Willis daytime somnolence but does have intermittent residual daytime sleepiness.  She is commended for Donna Donna Willis treatment adherence.  She is encouraged to try to strive for an average of 7 hours of sleep if possible.  We talked about stress and its management as well today.  She is encouraged to talk to Donna Donna Willis primary care physician further, especially about Donna possibility of increasing Wellbutrin.  Wellbutrin does tend to have a more activating tendency insert as Prozac but increasing Prozac in Donna past did not help very much.  She is advised to continue with full compliance of Donna Donna Willis CPAP.  Utilizing Wellbutrin can sometimes also help Donna Willis's obtain some weight loss.  She is advised to follow-up routinely in this clinic in 1 year, sooner if needed.  I answered all Donna Donna Willis questions today and she was in agreement. I spent 30 minutes in total face-to-face time and in reviewing records during pre-charting, more than 50% of which was spent in counseling and coordination of care, reviewing test results, reviewing medications and treatment regimen and/or in discussing or reviewing Donna diagnosis of OSA, Donna prognosis and treatment options. Pertinent laboratory and imaging test results that were available during this visit with Donna Donna Willis were reviewed by me and considered in my medical decision making (see chart for details).

## 2021-04-24 ENCOUNTER — Other Ambulatory Visit (HOSPITAL_COMMUNITY): Payer: Self-pay

## 2021-04-24 MED FILL — Metformin HCl Tab 500 MG: ORAL | 30 days supply | Qty: 30 | Fill #0 | Status: AC

## 2021-05-05 ENCOUNTER — Other Ambulatory Visit (HOSPITAL_COMMUNITY): Payer: Self-pay

## 2021-05-05 MED ORDER — COVID-19 AT HOME ANTIGEN TEST VI KIT
PACK | 0 refills | Status: DC
  Filled 2021-05-05: qty 4, 4d supply, fill #0

## 2021-05-17 ENCOUNTER — Other Ambulatory Visit (HOSPITAL_COMMUNITY): Payer: Self-pay

## 2021-05-17 MED FILL — Estradiol Tab 2 MG: ORAL | 90 days supply | Qty: 90 | Fill #0 | Status: AC

## 2021-05-23 ENCOUNTER — Encounter: Payer: Self-pay | Admitting: Family Medicine

## 2021-05-23 ENCOUNTER — Other Ambulatory Visit (HOSPITAL_COMMUNITY): Payer: Self-pay

## 2021-05-23 ENCOUNTER — Telehealth: Payer: Self-pay

## 2021-05-23 MED ORDER — NIRMATRELVIR/RITONAVIR (PAXLOVID)TABLET
3.0000 | ORAL_TABLET | Freq: Two times a day (BID) | ORAL | 0 refills | Status: AC
  Filled 2021-05-23: qty 30, 5d supply, fill #0

## 2021-05-23 MED FILL — Metformin HCl Tab 500 MG: ORAL | 30 days supply | Qty: 30 | Fill #1 | Status: AC

## 2021-05-23 NOTE — Telephone Encounter (Signed)
Spoke with pt, sxs started 1 day ago; cough started getting bad this weekend, increased frequency. No known covid exposure. 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   Please advise.

## 2021-05-23 NOTE — Telephone Encounter (Signed)
OK, I'll eRx paxlovid for her. I'll also reach out to infusion clinic to see if there is any option available for [facility] for post-exposure prophylaxis.

## 2021-05-23 NOTE — Telephone Encounter (Signed)
Cough,fever, headache, body aches,congestion and tested COVID at home test yesterday.  Very worried about her husband, Merry Proud, but he has no symptoms yet. Infusion? Medication?    Please call patient at [phone]

## 2021-05-23 NOTE — Telephone Encounter (Signed)
Spoke with pt regarding rx instructions.

## 2021-06-02 ENCOUNTER — Other Ambulatory Visit (HOSPITAL_COMMUNITY): Payer: Self-pay

## 2021-06-02 MED FILL — Progesterone Cap 200 MG: ORAL | 90 days supply | Qty: 90 | Fill #0 | Status: AC

## 2021-06-09 DIAGNOSIS — G4733 Obstructive sleep apnea (adult) (pediatric): Secondary | ICD-10-CM | POA: Diagnosis not present

## 2021-06-23 ENCOUNTER — Other Ambulatory Visit (HOSPITAL_COMMUNITY): Payer: Self-pay

## 2021-06-24 ENCOUNTER — Other Ambulatory Visit: Payer: Self-pay | Admitting: Family Medicine

## 2021-06-24 ENCOUNTER — Other Ambulatory Visit (HOSPITAL_COMMUNITY): Payer: Self-pay

## 2021-06-26 ENCOUNTER — Other Ambulatory Visit (HOSPITAL_COMMUNITY): Payer: Self-pay

## 2021-06-26 MED ORDER — METFORMIN HCL 500 MG PO TABS
ORAL_TABLET | ORAL | 0 refills | Status: DC
  Filled 2021-06-26: qty 30, 30d supply, fill #0

## 2021-07-11 ENCOUNTER — Other Ambulatory Visit (HOSPITAL_COMMUNITY): Payer: Self-pay

## 2021-07-11 ENCOUNTER — Other Ambulatory Visit: Payer: Self-pay | Admitting: Family Medicine

## 2021-07-11 MED ORDER — FLUOXETINE HCL 20 MG PO CAPS
ORAL_CAPSULE | Freq: Every day | ORAL | 0 refills | Status: DC
  Filled 2021-07-11: qty 30, 30d supply, fill #0

## 2021-07-19 ENCOUNTER — Telehealth (INDEPENDENT_AMBULATORY_CARE_PROVIDER_SITE_OTHER): Payer: 59 | Admitting: Family Medicine

## 2021-07-19 ENCOUNTER — Other Ambulatory Visit (HOSPITAL_COMMUNITY): Payer: Self-pay

## 2021-07-19 ENCOUNTER — Encounter: Payer: Self-pay | Admitting: Family Medicine

## 2021-07-19 VITALS — Wt 240.0 lb

## 2021-07-19 DIAGNOSIS — R7303 Prediabetes: Secondary | ICD-10-CM

## 2021-07-19 DIAGNOSIS — F339 Major depressive disorder, recurrent, unspecified: Secondary | ICD-10-CM | POA: Diagnosis not present

## 2021-07-19 DIAGNOSIS — F331 Major depressive disorder, recurrent, moderate: Secondary | ICD-10-CM | POA: Diagnosis not present

## 2021-07-19 MED ORDER — BUPROPION HCL ER (XL) 300 MG PO TB24
300.0000 mg | ORAL_TABLET | Freq: Every day | ORAL | 1 refills | Status: DC
  Filled 2021-07-19: qty 30, 30d supply, fill #0
  Filled 2021-09-10: qty 30, 30d supply, fill #1

## 2021-07-19 NOTE — Addendum Note (Signed)
Addended by: Tammi Sou on: 07/19/2021 04:34 PM   Modules accepted: Orders

## 2021-07-19 NOTE — Progress Notes (Signed)
Virtual Visit via Video Note  I connected with Donna Willis on 07/19/21 at  1:30 PM EDT by a video enabled telemedicine application and verified that I am speaking with the correct person using two identifiers.  Location patient: home, [place] Location provider:work or home office Persons participating in the virtual visit: patient, provider  I discussed the limitations of evaluation and management by telemedicine and the availability of in person appointments. The patient expressed understanding and agreed to proceed.  HPI: 60 y/o WF being seen today for f/u prediabetes and GAD with hx of recurrent MDD. A/P as of last visit about 10 months ago: "1) Prediabetes: working on TLC. Cont metformin 500 mg qhs. Hba1c and fasting glucose today.   2) Anxiety and depression: stable. Continue fluox 20 mg qd and wellbutrin xl 132m qd.   3) Health maintenance exam: Reviewed age and gender appropriate health maintenance issues (prudent diet, regular exercise, health risks of tobacco and excessive alcohol, use of seatbelts, fire alarms in home, use of sunscreen).  Also reviewed age and gender appropriate health screening as well as vaccine recommendations. Vaccines:  Flu->UTD.  Otherwise ALL UTD. Labs: fasting HP + a1c. Cervical ca screening: got pap last week at GYN. Breast ca screening:  Last in EMR is from 2019->per [facility]->scheduled for Feb 2022. Colon ca screening: hx of polyps, recall 08/2021."  INTERIM HX: Doing fairly well.  Recent OSA f/u with Dr. ARexene Alberts->all good except some residual excessive daytime sleepiness. Felt to be secondary to uncontrolled depression. Mood still down about 50% of the time.  No extremes, tends to pull herself out of things by keeping busy.  No extremes of anxiety, no panic. Lots of husband's chronic illness needs and uncertainties have her chronically down, distracted, impaired concentration. Feels inadequate, guilty easily.   Never gets time to  herself. Family not able to help her much and she is apprehensive about asking them for help.   ROS: See pertinent positives and negatives per HPI.  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 DRosana Hoes hasn't seen him in yrs or so, was on elmiron and coldn't tell much difference.   MDD (major depressive disorder)    Mild persistent asthma 08/31/2013   [facility]: stable as of pulm f/u 02/2018 and 05/2019: no med changes made---1 yr f/u recommended.   OSA on CPAP 07/2020   Personal history of  adenomatous colonic polyp 10/28/2012   10/2012 - 4x10 mm tubular adenoma; repeat 09/03/16 showed NO POLYPS.  Recall 5 yrs (08/2021).   Prediabetes 08/2019   08/20/19 HbA1c 6.4%->recommended metformin at that time.    Past Surgical History:  Procedure Laterality Date   CARDIOVASCULAR STRESS TEST  09/2012   Rest/Stress myoview: normal   CESAREAN SECTION  1988   CHOLECYSTECTOMY  2002   COLONOSCOPY W/ POLYPECTOMY  10/22/12; 09/03/16   2013; tubular adenoma, no high grade dysplasia (Dr. GCarlean Purl.  Repeat 08/2016: no polyps.  Recall 5 yrs (08/2021--Dr. GCarlean Purl.  CPAP  07/2020   CYSTOSCOPY     with bladder bx 07/22/18-->   EPIGASTRIC HERNIA REPAIR N/A 06/26/2019   Procedure: LAPAROSCOPIC EPIGASTRIC HERNIA REPAIR WITH MESH;  Surgeon: Alphonsa Overall, MD;  Location: [facility];  Service: General;  Laterality: N/A;   HERNIA REPAIR  3500   umbilical, during lap chole   TONSILLECTOMY AND ADENOIDECTOMY  9381   UMBILICAL HERNIA REPAIR  08/19/2012   Procedure: LAPAROSCOPIC UMBILICAL  HERNIA;  Surgeon: Shann Medal, MD;  Location: [facility];  Service: General;  Laterality: N/A;     Current Outpatient Medications:    acetaminophen (TYLENOL) 500 MG tablet, Take 1,000 mg by mouth every 6 (six) hours as needed for moderate pain., Disp: , Rfl:    buPROPion (WELLBUTRIN XL) 150 MG 24 hr tablet, TAKE 1 TABLET BY MOUTH DAILY., Disp: 90 tablet, Rfl: 3   Cinnamon 500 MG capsule, Take 500 mg by mouth daily., Disp: , Rfl:    COVID-19 At Home Antigen Test KIT, Use as directed in package instructions, Disp: 4 each, Rfl: 0   estradiol (ESTRACE) 2 MG tablet, TAKE 1 TABLET BY MOUTH ONCE A DAY, Disp: 30 tablet, Rfl: 3   famotidine (PEPCID) 20 MG tablet, Take 20 mg by mouth as needed for heartburn or indigestion., Disp: , Rfl:    FLUoxetine (PROZAC) 20 MG capsule, TAKE 1 CAPSULE BY MOUTH ONCE A DAY, Disp: 30 capsule, Rfl: 0   fluticasone (FLOVENT HFA) 110 MCG/ACT inhaler, INHALE 2 PUFFS INTO THE LUNGS DAILY., Disp: 12 g, Rfl: 12   Ibuprofen (MOTRIN PO), Take by mouth as needed., Disp: , Rfl:    metFORMIN (GLUCOPHAGE) 500 MG tablet, TAKE 1 TABLET BY MOUTH ONCE DAILY AT SUPPERTIME (APPT NEEDED), Disp: 30 tablet, Rfl: 0   progesterone (PROMETRIUM) 200 MG capsule, TAKE 1 CAPSULE BY MOUTH AT BEDTIME, Disp: 90 capsule, Rfl: 3  EXAM:  VITALS per patient if applicable:  Vitals with BMI 04/18/2021 10/03/2020 09/22/2020  Height '5\' 5"'  '5\' 4"'  '5\' 5"'   Weight 247 lbs 235 lbs 13 oz 236 lbs  BMI 41.1 82.99 37.16  Systolic 967 893 810  Diastolic 69 65 69  Pulse 62 65 66     GENERAL: alert, oriented, appears well and in no acute distress  HEENT: atraumatic, conjunttiva clear, no obvious abnormalities on inspection of external nose and ears  NECK: normal movements of the head and neck  LUNGS: on inspection no signs of respiratory distress, breathing rate appears normal, no obvious gross SOB, gasping or wheezing  CV: no obvious cyanosis  MS: moves all visible extremities without noticeable  abnormality  PSYCH/NEURO: pleasant and cooperative, no obvious depression or anxiety, speech and thought processing grossly intact  LABS: none today    Chemistry      Component Value Date/Time   NA 140 10/03/2020 0832   K 4.2 10/03/2020 0832   CL 106 10/03/2020 0832   CO2 27 10/03/2020 0832   BUN 12 10/03/2020 0832   CREATININE 0.73 10/03/2020 0832      Component Value Date/Time   CALCIUM 9.1 10/03/2020 0832   ALKPHOS 57 10/03/2020 0832   AST 16 10/03/2020 0832   ALT 15 10/03/2020 0832   BILITOT 0.5 10/03/2020 0832     Lab Results  Component Value Date   CHOL 181 10/03/2020   HDL 58.00 10/03/2020   LDLCALC 102 (H) 10/03/2020   LDLDIRECT 159.8 09/25/2012   TRIG 104.0 10/03/2020   CHOLHDL 3 10/03/2020   Lab Results  Component  Value Date   WBC 5.0 10/03/2020   HGB 12.8 10/03/2020   HCT 39.0 10/03/2020   MCV 88.3 10/03/2020   PLT 207.0 10/03/2020   Lab Results  Component Value Date   TSH 2.47 10/03/2020   Lab Results  Component Value Date   HGBA1C 6.0 10/03/2020   ASSESSMENT AND PLAN:  Discussed the following assessment and plan:  1) MDD, recurrent, mild/mod, treatment resistant. Will maximize her wellbutrin xl to the 387m qd dosing.  Cont fluox 20 qd (she had increased daytime sleepiness on 474mof this med in the past). If not optimal improvement in 1 mo then will discuss starting stimulant as augmenting med for depression as well as help with daytime sleepiness.  2) Prediabetes: has done well on metformin 500 qd. A1c and bmet future.  I discussed the assessment and treatment plan with the patient. The patient was provided an opportunity to ask questions and all were answered. The patient agreed with the plan and demonstrated an understanding of the instructions.   F/u: 1 mo  Signed:  PhCrissie SicklesMD           07/19/2021

## 2021-07-20 ENCOUNTER — Other Ambulatory Visit: Payer: Self-pay

## 2021-07-20 ENCOUNTER — Ambulatory Visit (INDEPENDENT_AMBULATORY_CARE_PROVIDER_SITE_OTHER): Payer: 59

## 2021-07-20 DIAGNOSIS — R7303 Prediabetes: Secondary | ICD-10-CM | POA: Diagnosis not present

## 2021-07-20 LAB — BASIC METABOLIC PANEL
BUN: 15 mg/dL (ref 6–23)
CO2: 27 mEq/L (ref 19–32)
Calcium: 8.8 mg/dL (ref 8.4–10.5)
Chloride: 105 mEq/L (ref 96–112)
Creatinine, Ser: 0.76 mg/dL (ref 0.40–1.20)
GFR: 85.73 mL/min (ref >60.00)
Glucose, Bld: 93 mg/dL (ref 70–99)
Potassium: 4.4 mEq/L (ref 3.5–5.1)
Sodium: 139 mEq/L (ref 135–145)

## 2021-07-20 LAB — HEMOGLOBIN A1C: Hgb A1c MFr Bld: 6 % (ref 4.6–6.5)

## 2021-07-27 ENCOUNTER — Other Ambulatory Visit: Payer: Self-pay | Admitting: Family Medicine

## 2021-07-27 ENCOUNTER — Other Ambulatory Visit (HOSPITAL_COMMUNITY): Payer: Self-pay

## 2021-07-27 MED ORDER — METFORMIN HCL 500 MG PO TABS
ORAL_TABLET | ORAL | 1 refills | Status: DC
  Filled 2021-07-27: qty 90, 90d supply, fill #0
  Filled 2021-10-27: qty 90, 90d supply, fill #1

## 2021-08-12 ENCOUNTER — Other Ambulatory Visit: Payer: Self-pay | Admitting: Family Medicine

## 2021-08-14 ENCOUNTER — Other Ambulatory Visit (HOSPITAL_COMMUNITY): Payer: Self-pay

## 2021-08-14 MED ORDER — FLUOXETINE HCL 20 MG PO CAPS
ORAL_CAPSULE | Freq: Every day | ORAL | 0 refills | Status: DC
  Filled 2021-08-14: qty 30, 30d supply, fill #0

## 2021-08-16 ENCOUNTER — Other Ambulatory Visit (HOSPITAL_BASED_OUTPATIENT_CLINIC_OR_DEPARTMENT_OTHER): Payer: Self-pay

## 2021-08-16 ENCOUNTER — Ambulatory Visit: Payer: 59 | Admitting: Family Medicine

## 2021-08-16 ENCOUNTER — Other Ambulatory Visit: Payer: Self-pay

## 2021-08-16 ENCOUNTER — Encounter: Payer: Self-pay | Admitting: Family Medicine

## 2021-08-16 VITALS — BP 105/66 | HR 69 | Temp 98.1°F | Ht 65.0 in | Wt 244.0 lb

## 2021-08-16 DIAGNOSIS — L720 Epidermal cyst: Secondary | ICD-10-CM | POA: Diagnosis not present

## 2021-08-16 MED ORDER — MUPIROCIN 2 % EX OINT
1.0000 "application " | TOPICAL_OINTMENT | Freq: Three times a day (TID) | CUTANEOUS | 1 refills | Status: AC
  Filled 2021-08-16: qty 22, 8d supply, fill #0

## 2021-08-16 NOTE — Progress Notes (Signed)
OFFICE VISIT  08/16/2021  CC:  Chief Complaint  Patient presents with   Spot on chest    Pt believes it may be cyst or carbuncle; warm to touch and painful. It got worse on Saturday, tried hot compress. It seems to be more uncomfortable at night.   HPI:    Patient is a 60 y.o. female who presents for "spot on chest".  HPI: Has had small bump on sternal area for years, just started to turn red and swell 4 d/a. Same spot got lanced about 10 yrs ago. No fatigue/malaise/fever. Hot compresses applied, no drainage.  Similar spot on scalp in the past--chronic, pt says now dark colored.  No pain or drainage or bleeding.  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 Rosana Hoes: hasn't seen him in yrs or so, was on elmiron and coldn't tell much difference.   MDD (major depressive disorder)    Mild persistent asthma 08/31/2013   [facility] pulm: stable as of pulm f/u 02/2018 and 05/2019: no med changes made---1 yr f/u recommended.   OSA on CPAP 07/2020   Personal history of  adenomatous colonic polyp 10/28/2012   10/2012 - 4x10 mm tubular adenoma; repeat 09/03/16 showed NO POLYPS.  Recall 5 yrs (08/2021).   Prediabetes 08/2019   08/20/19 HbA1c 6.4%->recommended metformin at that time.    Past Surgical History:  Procedure Laterality Date   CARDIOVASCULAR STRESS TEST  09/2012   Rest/Stress myoview: normal   CESAREAN SECTION  1988   CHOLECYSTECTOMY  2002   COLONOSCOPY W/ POLYPECTOMY  10/22/12; 09/03/16   2013; tubular adenoma, no high grade dysplasia (Dr. Carlean Purl).   Repeat 08/2016: no polyps.  Recall 5 yrs (08/2021--Dr. Carlean Purl).   CPAP  07/2020   CYSTOSCOPY     with bladder bx 07/22/18-->   EPIGASTRIC HERNIA REPAIR N/A 06/26/2019   Procedure: LAPAROSCOPIC EPIGASTRIC HERNIA REPAIR WITH MESH;  Surgeon: Alphonsa Overall, MD;  Location: [facility];  Service: General;  Laterality: N/A;   HERNIA REPAIR  3235   umbilical, during lap chole   TONSILLECTOMY AND ADENOIDECTOMY  5732   UMBILICAL HERNIA REPAIR  08/19/2012   Procedure: LAPAROSCOPIC UMBILICAL HERNIA;  Surgeon: Shann Medal, MD;  Location: [facility];  Service: General;  Laterality: N/A;    Outpatient Medications Prior to Visit  Medication Sig Dispense Refill   acetaminophen (TYLENOL) 500 MG tablet Take 1,000 mg by mouth every 6 (six) hours as needed for moderate pain.     buPROPion (WELLBUTRIN XL) 300 MG 24 hr tablet Take 1 tablet (300 mg total) by mouth daily. 30 tablet 1   Cinnamon 500 MG capsule Take 500 mg by mouth daily.     estradiol (ESTRACE) 2 MG tablet TAKE 1 TABLET BY MOUTH ONCE A DAY 30 tablet 3   famotidine (PEPCID) 20 MG tablet Take 20 mg by mouth as needed for heartburn or indigestion.     FLUoxetine (PROZAC) 20 MG capsule TAKE 1 CAPSULE BY MOUTH ONCE A DAY  30 capsule 0   Ibuprofen (MOTRIN PO) Take by mouth as needed.     metFORMIN (GLUCOPHAGE) 500 MG tablet TAKE 1 TABLET BY MOUTH ONCE DAILY AT SUPPERTIME (APPT NEEDED) 90 tablet 1   progesterone (PROMETRIUM) 200 MG capsule TAKE 1 CAPSULE BY MOUTH AT BEDTIME 90 capsule 3   fluticasone (FLOVENT HFA) 110 MCG/ACT inhaler INHALE 2 PUFFS INTO THE LUNGS DAILY. 12 g 12   COVID-19 At Home Antigen Test KIT Use as directed in package instructions (Patient not taking: Reported on 08/16/2021) 4 each 0   No facility-administered medications prior to visit.    Allergies  Allergen Reactions   Ciprofloxacin Itching    ROS As per HPI  PE: Vitals with BMI 08/16/2021 07/19/2021 04/18/2021  Height _0  - _1   Weight 244 lbs 240 lbs 247 lbs  BMI 76.7 - 20.9   Systolic 470 - 962  Diastolic 66 - 69  Pulse 69 - 62   Exam chaperoned by Deveron Furlong, CMA.  Gen: Alert, well appearing.  Patient is oriented to person, place, time, and situation. AFFECT: pleasant, lucid thought and speech. Scalp: 1 cm round crusted plaque that is dark brown/golden. No surrounding erythema. Nontender. Pressure applied to this makes a tiny bit of light yellow mucous come out at the edges.  Upper sternal area just to the R of midline with 3 cm round subQ nodule that is fluctuance and feels rubbery beneath/indurated around borders.  No drainage tract present.  LABS:    Chemistry      Component Value Date/Time   NA 139 07/20/2021 0808   K 4.4 07/20/2021 0808   CL 105 07/20/2021 0808   CO2 27 07/20/2021 0808   BUN 15 07/20/2021 0808   CREATININE 0.76 07/20/2021 0808      Component Value Date/Time   CALCIUM 8.8 07/20/2021 0808   ALKPHOS 57 10/03/2020 0832   AST 16 10/03/2020 0832   ALT 15 10/03/2020 0832   BILITOT 0.5 10/03/2020 0832     Lab Results  Component Value Date   HGBA1C 6.0 07/20/2021   IMPRESSION AND PLAN:  1) Cyst on chest wall, needs I&D. Explained procedure to pt and she was agreeable to this plan since conservative therapy has failed. Procedure: Incision and drainage of abscess/cyst.  The indication for the procedure was explained to the patient, benefits and risks of procedure were outlined for patient, patient agreed to proceed.  Steps of the procedure were clearly explained to the patient prior to starting. Injected lesion with 4 ml of 2% lidocaine with epi for local anesthesia.  Incised central portion of lesion with scalpel and used manual pressure and hemostats to express contents and encourage complete drainage.  Large central core of lesion removed as well as the surrounding yellowish mucous.  Wound packed with iodoform gauze and dressed.  No bleeding.  Patient tolerated procedure well.  No immediate complications.  Wound care  instructions given.  Warning signs of infection discussed. Follow up discussed.  Call or return for problems. 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  2) Scalp epidermal cyst.  I unroofed the golden/crusted plaque from this and there was a 1 mm deep divot of subQ tissue visible w/out foul odor or active exudate.  No induration or fluctuance. If not improving significantly over the next couple weeks she'll return for recheck.  An After Visit Summary  was printed and given to the patient.  FOLLOW UP: Return for as needed.  Signed:  Crissie Sickles, MD           08/16/2021

## 2021-08-16 NOTE — Telephone Encounter (Signed)
Pt currently scheduled for 3:30 in office evaluation today. She has been notified regarding appt

## 2021-08-16 NOTE — Telephone Encounter (Signed)
Pt wants to know if she could possibly be worked in today or if she needs to go to an urgent care, please advise

## 2021-08-23 MED FILL — Estradiol Tab 2 MG: ORAL | 30 days supply | Qty: 30 | Fill #1 | Status: AC

## 2021-08-24 ENCOUNTER — Other Ambulatory Visit (HOSPITAL_COMMUNITY): Payer: Self-pay

## 2021-09-06 ENCOUNTER — Other Ambulatory Visit: Payer: Self-pay | Admitting: Family Medicine

## 2021-09-06 ENCOUNTER — Other Ambulatory Visit (HOSPITAL_COMMUNITY): Payer: Self-pay

## 2021-09-06 MED ORDER — FLUOXETINE HCL 20 MG PO CAPS
ORAL_CAPSULE | Freq: Every day | ORAL | 0 refills | Status: DC
  Filled 2021-09-06: qty 30, 30d supply, fill #0

## 2021-09-06 MED FILL — Progesterone Cap 200 MG: ORAL | 90 days supply | Qty: 90 | Fill #1 | Status: AC

## 2021-09-07 ENCOUNTER — Other Ambulatory Visit (HOSPITAL_COMMUNITY): Payer: Self-pay

## 2021-09-11 ENCOUNTER — Other Ambulatory Visit (HOSPITAL_COMMUNITY): Payer: Self-pay

## 2021-09-15 DIAGNOSIS — G4733 Obstructive sleep apnea (adult) (pediatric): Secondary | ICD-10-CM | POA: Diagnosis not present

## 2021-10-02 ENCOUNTER — Other Ambulatory Visit (HOSPITAL_COMMUNITY): Payer: Self-pay

## 2021-10-02 ENCOUNTER — Other Ambulatory Visit: Payer: Self-pay

## 2021-10-02 MED ORDER — ESTRADIOL 2 MG PO TABS
2.0000 mg | ORAL_TABLET | Freq: Every day | ORAL | 0 refills | Status: DC
  Filled 2021-10-02: qty 30, 30d supply, fill #0

## 2021-10-04 ENCOUNTER — Other Ambulatory Visit (HOSPITAL_COMMUNITY): Payer: Self-pay

## 2021-10-15 ENCOUNTER — Emergency Department (HOSPITAL_BASED_OUTPATIENT_CLINIC_OR_DEPARTMENT_OTHER)
Admission: EM | Admit: 2021-10-15 | Discharge: 2021-10-16 | Disposition: A | Discharge status: Home / Self Care | Payer: 59 | Attending: Emergency Medicine | Admitting: Emergency Medicine

## 2021-10-15 ENCOUNTER — Encounter (HOSPITAL_BASED_OUTPATIENT_CLINIC_OR_DEPARTMENT_OTHER): Payer: Self-pay | Admitting: *Deleted

## 2021-10-15 ENCOUNTER — Emergency Department (HOSPITAL_BASED_OUTPATIENT_CLINIC_OR_DEPARTMENT_OTHER): Payer: 59 | Admitting: Radiology

## 2021-10-15 ENCOUNTER — Other Ambulatory Visit: Payer: Self-pay

## 2021-10-15 DIAGNOSIS — J4530 Mild persistent asthma, uncomplicated: Secondary | ICD-10-CM | POA: Insufficient documentation

## 2021-10-15 DIAGNOSIS — S99921A Unspecified injury of right foot, initial encounter: Secondary | ICD-10-CM | POA: Diagnosis present

## 2021-10-15 DIAGNOSIS — Z7984 Long term (current) use of oral hypoglycemic drugs: Secondary | ICD-10-CM | POA: Insufficient documentation

## 2021-10-15 DIAGNOSIS — Z043 Encounter for examination and observation following other accident: Secondary | ICD-10-CM | POA: Diagnosis not present

## 2021-10-15 DIAGNOSIS — W108XXA Fall (on) (from) other stairs and steps, initial encounter: Secondary | ICD-10-CM | POA: Insufficient documentation

## 2021-10-15 DIAGNOSIS — W19XXXA Unspecified fall, initial encounter: Secondary | ICD-10-CM

## 2021-10-15 DIAGNOSIS — S96911A Strain of unspecified muscle and tendon at ankle and foot level, right foot, initial encounter: Secondary | ICD-10-CM | POA: Diagnosis not present

## 2021-10-15 DIAGNOSIS — Z7952 Long term (current) use of systemic steroids: Secondary | ICD-10-CM | POA: Diagnosis not present

## 2021-10-15 DIAGNOSIS — S93501A Unspecified sprain of right great toe, initial encounter: Secondary | ICD-10-CM | POA: Diagnosis not present

## 2021-10-15 DIAGNOSIS — M25511 Pain in right shoulder: Secondary | ICD-10-CM

## 2021-10-15 DIAGNOSIS — S4351XA Sprain of right acromioclavicular joint, initial encounter: Secondary | ICD-10-CM | POA: Diagnosis not present

## 2021-10-15 MED ORDER — NAPROXEN 500 MG PO TABS: 500.0000 mg | ORAL_TABLET | Freq: Two times a day (BID) | ORAL | 0 refills | Status: DC

## 2021-10-15 MED ORDER — KETOROLAC TROMETHAMINE 30 MG/ML IJ SOLN
30.0000 mg | Freq: Once | INTRAMUSCULAR | Status: AC
  Administered 2021-10-15: 30 mg via INTRAMUSCULAR
  Filled 2021-10-15: qty 1

## 2021-10-15 MED ORDER — HYDROCODONE-ACETAMINOPHEN 5-325 MG PO TABS
1.0000 | ORAL_TABLET | Freq: Once | ORAL | Status: AC
  Administered 2021-10-15: 1 via ORAL
  Filled 2021-10-15: qty 1

## 2021-10-15 NOTE — Discharge Instructions (Signed)
You were seen today after a fall.  Your x-rays do not show any fracture.  Take naproxen as needed for pain.  You may apply ice.

## 2021-10-15 NOTE — ED Notes (Signed)
Pt verbalizes understanding of discharge instructions. Opportunity for questioning and answers were provided. Pt discharged from ED to home. Friend to drive pt home

## 2021-10-15 NOTE — ED Notes (Signed)
EDP at Bedside 

## 2021-10-15 NOTE — ED Notes (Signed)
Patient comfortable at this Time. Pain is 2/10 without Movement. NAD Noted during Assessment. Call [place] within reach and Patient advised to alert Staff if she needs assistance. Staff will continue to monitor.

## 2021-10-15 NOTE — ED Triage Notes (Signed)
Pt reports she tripped and slid down the steps. Her left foot hit the door at the bottom of the steps and c/o pain in the right collar bone.

## 2021-10-15 NOTE — ED Provider Notes (Signed)
[facility] Provider Note   CSN: [id number] Arrival date & time: 10/15/21  1941     History Chief Complaint  Patient presents with   Donna Willis is a 60 y.o. female.  HPI     This is a 60 year old female with a history of depression, OSA, prediabetes who presents following a fall.  Patient reports that she slipped and fell down approximately 10 steps.  She slipped on her right foot falling on her back and bottom.  She is reporting pain to the right shoulder and to the right foot.  She states she tried to brace herself with her right hand.  She did not hit her head or lose consciousness.  She is not on any blood thinners.  She has been ambulatory.  Rates her pain 8 out of 10.  She did not take anything for pain.  Denies back pain, chest pain, shortness of breath.  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 Rosana Hoes: hasn't seen him in yrs or so, was on elmiron and coldn't tell much difference.   MDD (major depressive disorder)    Mild persistent asthma 08/31/2013   [place] pulm: stable as of pulm f/u 02/2018 and 05/2019: no med changes made---1 yr f/u recommended.   OSA on CPAP 07/2020   Personal history of  adenomatous colonic polyp 10/28/2012   10/2012 - 4x10 mm tubular adenoma; repeat 09/03/16 showed NO POLYPS.  Recall 5 yrs (08/2021).   Prediabetes 08/2019   08/20/19 HbA1c 6.4%->recommended metformin at that time.    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 08/18/2012    Past Surgical History:  Procedure Laterality Date   CARDIOVASCULAR STRESS TEST  09/2012   Rest/Stress myoview: normal   CESAREAN SECTION  1988   CHOLECYSTECTOMY  2002   COLONOSCOPY W/ POLYPECTOMY  10/22/12; 09/03/16   2013; tubular adenoma, no high grade dysplasia (Dr. Carlean Purl).  Repeat 08/2016: no polyps.  Recall 5 yrs (08/2021--Dr. Carlean Purl).   CPAP  07/2020   CYSTOSCOPY     with bladder bx 07/22/18-->   EPIGASTRIC HERNIA REPAIR N/A 06/26/2019   Procedure: LAPAROSCOPIC EPIGASTRIC HERNIA REPAIR WITH MESH;  Surgeon: Alphonsa Overall, MD;  Location: [facility];  Service: General;  Laterality: N/A;   HERNIA REPAIR  7972   umbilical, during lap chole   TONSILLECTOMY AND ADENOIDECTOMY  8206   UMBILICAL HERNIA REPAIR  08/19/2012  Procedure: LAPAROSCOPIC UMBILICAL HERNIA;  Surgeon: Shann Medal, MD;  Location: [facility];  Service: General;  Laterality: N/A;     OB History   No obstetric history on file.     Family History  Problem Relation Age of Onset   Colon polyps Mother    Diverticulitis Father    Other Maternal Uncle        great-stom or colon cancer   Colon cancer Maternal Uncle    Kidney failure Brother    Kidney disease Brother    Rectal cancer Neg Hx     Social History   Tobacco Use   Smoking status: Never   Smokeless tobacco: Never  Vaping Use   Vaping Use: Never used  Substance Use Topics   Alcohol use: Yes     Comment: rare   Drug use: No    Home Medications Prior to Admission medications   Medication Sig Start Date End Date Taking? Authorizing Provider  naproxen (NAPROSYN) 500 MG tablet Take 1 tablet (500 mg total) by mouth 2 (two) times daily. 10/15/21  Yes Ashlynn Gunnels, Barbette Hair, MD  acetaminophen (TYLENOL) 500 MG tablet Take 1,000 mg by mouth every 6 (six) hours as needed for moderate pain.    [provider]  buPROPion (WELLBUTRIN XL) 300 MG 24 hr tablet Take 1 tablet (300 mg total) by mouth daily. 07/19/21   McGowen, Adrian Blackwater, MD  Cinnamon 500 MG capsule Take 500 mg by mouth daily.    [provider]  estradiol (ESTRACE) 2 MG tablet TAKE 1 TABLET BY MOUTH ONCE A DAY 01/20/21 01/20/22  Maisie Fus, MD  estradiol (ESTRACE) 2 MG tablet TAKE 1 TABLET BY MOUTH ONCE A DAY 10/02/21     famotidine (PEPCID) 20 MG tablet Take 20 mg by mouth as needed for heartburn or indigestion.    [provider]  FLUoxetine (PROZAC) 20 MG capsule TAKE 1 CAPSULE BY MOUTH ONCE A DAY 09/06/21 09/06/22  McGowen, Adrian Blackwater, MD  fluticasone (FLOVENT HFA) 110 MCG/ACT inhaler INHALE 2 PUFFS INTO THE LUNGS DAILY. 06/02/20 06/02/21  Parrett, Fonnie Mu, NP  Ibuprofen (MOTRIN PO) Take by mouth as needed.    [provider]  metFORMIN (GLUCOPHAGE) 500 MG tablet TAKE 1 TABLET BY MOUTH ONCE DAILY AT SUPPERTIME (APPT NEEDED) 07/27/21 07/27/22  McGowen, Adrian Blackwater, MD  mupirocin ointment (BACTROBAN) 2 % Apply 1 application topically 3 (three) times daily. 08/16/21   Tammi Sou, MD  progesterone (PROMETRIUM) 200 MG capsule TAKE 1 CAPSULE BY MOUTH AT BEDTIME 12/23/20 12/23/21  Maisie Fus, MD    Allergies    Ciprofloxacin  Review of Systems   Review of Systems  Constitutional:  Negative for fever.  Respiratory:  Negative for shortness of breath.   Cardiovascular:  Negative for chest pain.  Gastrointestinal:  Negative for abdominal pain.  Musculoskeletal:        Shoulder pain, toe pain  Skin:   Negative for wound.  Neurological:  Negative for headaches.  All other systems reviewed and are negative.  Physical Exam Updated Vital Signs BP (!) 149/81 (BP Location: Left Arm)   Pulse 68   Temp 98 F (36.7 C)   Resp 18   Ht 1.651 m (5\' 5" )   Wt 108.9 kg   SpO2 98%   BMI 39.94 kg/m   Physical Exam Vitals and nursing note reviewed.  Constitutional:      Appearance: She is well-developed. She is obese. She is not ill-appearing.  HENT:     Head: Normocephalic and atraumatic.     Nose: Nose normal.     Mouth/Throat:     Mouth: Mucous membranes are moist.  Eyes:     Pupils: Pupils are equal, round, and reactive to light.  Cardiovascular:     Rate and Rhythm: Normal rate and regular rhythm.     Heart sounds: Normal heart sounds.  Pulmonary:     Effort: Pulmonary effort is normal. No respiratory distress.     Breath sounds: No wheezing.  Abdominal:     Palpations: Abdomen is soft.  Musculoskeletal:        General: No deformity.     Cervical back: Neck supple.     Comments: Tenderness to palpation at the [facility] joint of the right shoulder, no clavicular deformity noted, normal range of motion of the right shoulder, 2+ radial pulse Tenderness to palpation first MTP joint of the left toe, no obvious deformities, 2+ DP pulse  Skin:    General: Skin is warm and dry.  Neurological:     Mental Status: She is alert and oriented to person, place, and time.  Psychiatric:        Mood and Affect: Mood normal.    ED Results / Procedures / Treatments   Labs (all labs ordered are listed, but only abnormal results are displayed) Labs Reviewed - No data to display  EKG None  Radiology DG Clavicle Right  Result Date: 10/15/2021 CLINICAL DATA:  Pain after fall EXAM: RIGHT CLAVICLE - 2+ VIEWS COMPARISON:  None. FINDINGS: There is no evidence of fracture or other focal bone lesions. Soft tissues are unremarkable. IMPRESSION: Negative. Electronically Signed   By: Iven Finn M.D.    On: 10/15/2021 20:55   DG Foot Complete Left  Result Date: 10/15/2021 CLINICAL DATA:  Fall EXAM: LEFT FOOT - COMPLETE 3+ VIEW COMPARISON:  X-ray left foot 02/17/2014 FINDINGS: There is no evidence of fracture or dislocation. There is no evidence of arthropathy or other focal bone abnormality. Soft tissues are unremarkable. IMPRESSION: Negative. Electronically Signed   By: Iven Finn M.D.   On: 10/15/2021 21:02    Procedures Procedures   Medications Ordered in ED Medications  ketorolac (TORADOL) 30 MG/ML injection 30 mg (has no administration in time range)  HYDROcodone-acetaminophen (NORCO/VICODIN) 5-325 MG per tablet 1 tablet (has no administration in time range)    ED Course  I have reviewed the triage vital signs and the nursing notes.  Pertinent labs & imaging results that were available during my care of the patient were reviewed by me and considered in my medical decision making (see chart for details).    MDM Rules/Calculators/A&P                           Patient presents with pain after fall.  She is nontoxic and vital signs are largely reassuring.  X-rays obtained of the right shoulder and left foot.  These were independently reviewed by myself.  No obvious fractures.  She does have tenderness over the [facility] joint that would suggest potentially a first-degree AC joint injury.  No fractures noted of the left toe.  Suspect strain.  She is otherwise well-appearing.  I have reviewed her chart.  Normal prior creatinine.  Will start on anti-inflammatories.  Recommend ice.  After history, exam, and medical workup I feel the patient has been appropriately medically screened and is safe for discharge home. Pertinent diagnoses were  discussed with the patient. Patient was given return precautions.  Final Clinical Impression(s) / ED Diagnoses Final diagnoses:  Arthralgia of right acromioclavicular joint  Strain of great toe of right foot, initial encounter    Rx / DC Orders ED  Discharge Orders          Ordered    naproxen (NAPROSYN) 500 MG tablet  2 times daily        10/15/21 2321             Merryl Hacker, MD 10/15/21 2330

## 2021-10-16 ENCOUNTER — Other Ambulatory Visit (HOSPITAL_BASED_OUTPATIENT_CLINIC_OR_DEPARTMENT_OTHER): Payer: Self-pay

## 2021-10-16 MED ORDER — NAPROXEN 500 MG PO TABS
ORAL_TABLET | ORAL | 0 refills | Status: DC
  Filled 2021-10-16: qty 30, 15d supply, fill #0

## 2021-10-19 ENCOUNTER — Other Ambulatory Visit: Payer: Self-pay | Admitting: Family Medicine

## 2021-10-20 ENCOUNTER — Other Ambulatory Visit (HOSPITAL_COMMUNITY): Payer: Self-pay

## 2021-10-20 MED ORDER — FLUOXETINE HCL 20 MG PO CAPS
ORAL_CAPSULE | Freq: Every day | ORAL | 0 refills | Status: DC
  Filled 2021-10-20: qty 30, 30d supply, fill #0

## 2021-10-20 MED ORDER — BUPROPION HCL ER (XL) 300 MG PO TB24
300.0000 mg | ORAL_TABLET | Freq: Every day | ORAL | 0 refills | Status: DC
  Filled 2021-10-20: qty 30, 30d supply, fill #0

## 2021-10-23 NOTE — Telephone Encounter (Signed)
Nothing further needed at this time. 

## 2021-10-28 ENCOUNTER — Other Ambulatory Visit (HOSPITAL_COMMUNITY): Payer: Self-pay

## 2021-11-02 ENCOUNTER — Other Ambulatory Visit (HOSPITAL_COMMUNITY): Payer: Self-pay

## 2021-11-07 ENCOUNTER — Other Ambulatory Visit (HOSPITAL_COMMUNITY): Payer: Self-pay

## 2021-11-07 MED ORDER — ESTRADIOL 2 MG PO TABS
2.0000 mg | ORAL_TABLET | Freq: Every day | ORAL | 0 refills | Status: DC
  Filled 2021-11-07: qty 90, 90d supply, fill #0

## 2021-11-09 ENCOUNTER — Other Ambulatory Visit (HOSPITAL_COMMUNITY): Payer: Self-pay

## 2021-11-09 ENCOUNTER — Encounter: Payer: Self-pay | Admitting: Family Medicine

## 2021-11-09 ENCOUNTER — Other Ambulatory Visit: Payer: Self-pay

## 2021-11-09 ENCOUNTER — Telehealth (INDEPENDENT_AMBULATORY_CARE_PROVIDER_SITE_OTHER): Payer: 59 | Admitting: Family Medicine

## 2021-11-09 VITALS — Wt 240.0 lb

## 2021-11-09 DIAGNOSIS — F3341 Major depressive disorder, recurrent, in partial remission: Secondary | ICD-10-CM | POA: Diagnosis not present

## 2021-11-09 DIAGNOSIS — R41840 Attention and concentration deficit: Secondary | ICD-10-CM | POA: Diagnosis not present

## 2021-11-09 DIAGNOSIS — G4719 Other hypersomnia: Secondary | ICD-10-CM | POA: Diagnosis not present

## 2021-11-09 MED ORDER — AMPHETAMINE-DEXTROAMPHET ER 20 MG PO CP24
20.0000 mg | ORAL_CAPSULE | Freq: Every day | ORAL | 0 refills | Status: DC
  Filled 2021-11-09: qty 30, 30d supply, fill #0

## 2021-11-09 NOTE — Progress Notes (Signed)
Virtual Visit via Video Note  I connected with Donna Willis on 11/09/21 at 11:30 AM EST by a video enabled telemedicine application and verified that I am speaking with the correct person using two identifiers.  Location patient: home,  Location provider:work or home office Persons participating in the virtual visit: patient, provider  I discussed the limitations of evaluation and management by telemedicine and the availability of in person appointments. The patient expressed understanding and agreed to proceed.  HPI: 60 y/o female being seen today for 3 mo f/u MDD. A/P as of that visit: "MDD, recurrent, mild/mod, treatment resistant. Will maximize her wellbutrin xl to the 300mg  qd dosing.  Cont fluox 20 qd (she had increased daytime sleepiness on 40mg  of this med in the past). If not optimal improvement in 1 mo then will discuss starting stimulant as augmenting med for depression as well as help with daytime sleepiness."  INTERIM HX: Meggen is doing about the same. Does not feel like she necessarily notes any improvement with Wellbutrin increased to 300 mg dose. However, she does note significant worsening of anxiety and depression if she misses a day of her medicines. Her biggest problem remains excessive sleepiness in the daytime.  She is up pretty regularly during the night taking care of her chronically ill husband and there is not much that she can do to change this.  He does have obstructive sleep apnea but is compliant with her CPAP and relatively recent follow-up with her sleep physician showed things to be going very well. She feels like her sleepiness is affecting her work, she is afraid she may make a big mistake.  Has trouble focusing and concentrating and completing tasks.  Easily distracted. She does note that she feels a little less sleepy when she is active.  Of note, she was seen at [facility] ED on 10/15/2021 after having slipped and fallen going down the stairs.  I  reviewed this encounter today.  She was hurting in her right shoulder and right great toe.  X-rays of these areas were negative for fracture or dislocation.    ROS: See pertinent positives and negatives per HPI.  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 Rosana Hoes: hasn't seen him in yrs or so, was on elmiron and coldn't tell much difference.   MDD (major depressive disorder)    Mild persistent asthma 08/31/2013   [place] pulm: stable as of pulm f/u 02/2018 and 05/2019: no med changes made---1 yr f/u recommended.   OSA on CPAP 07/2020   Personal history of  adenomatous colonic polyp 10/28/2012   10/2012 - 4x10 mm tubular adenoma; repeat 09/03/16 showed NO POLYPS.  Recall 5 yrs (08/2021).   Prediabetes 08/2019   08/20/19 HbA1c 6.4%->recommended metformin at that time.    Past Surgical History:  Procedure Laterality Date   CARDIOVASCULAR STRESS TEST  09/2012   Rest/Stress myoview: normal   CESAREAN SECTION  1988   CHOLECYSTECTOMY  2002   COLONOSCOPY W/ POLYPECTOMY  10/22/12; 09/03/16   2013; tubular adenoma, no high grade dysplasia (Dr. Carlean Purl).  Repeat 08/2016: no polyps.  Recall 5 yrs (08/2021--Dr. Carlean Purl).   CPAP  07/2020   CYSTOSCOPY     with bladder bx 07/22/18-->   EPIGASTRIC HERNIA REPAIR N/A 06/26/2019   Procedure: LAPAROSCOPIC EPIGASTRIC HERNIA REPAIR WITH MESH;  Surgeon: Alphonsa Overall, MD;  Location: [facility];  Service: General;  Laterality: N/A;   HERNIA REPAIR  4132   umbilical, during lap chole   TONSILLECTOMY AND ADENOIDECTOMY  4401   UMBILICAL  HERNIA REPAIR  08/19/2012   Procedure: LAPAROSCOPIC UMBILICAL HERNIA;  Surgeon: Shann Medal, MD;  Location: [facility];  Service: General;  Laterality: N/A;     Current Outpatient Medications:    buPROPion (WELLBUTRIN XL) 300 MG 24 hr tablet, Take 1 tablet (300 mg total) by mouth daily., Disp: 30 tablet, Rfl: 0   Cinnamon 500 MG capsule, Take 500 mg by mouth daily., Disp: , Rfl:    estradiol (ESTRACE) 2 MG tablet, TAKE 1 TABLET BY MOUTH ONCE A DAY, Disp: 90 tablet, Rfl: 0   famotidine (PEPCID) 20 MG tablet, Take 20 mg by mouth as needed for heartburn or indigestion., Disp: , Rfl:    FLUoxetine (PROZAC) 20 MG capsule, TAKE 1 CAPSULE BY MOUTH ONCE A DAY, Disp: 30 capsule, Rfl: 0   metFORMIN (GLUCOPHAGE) 500 MG tablet, TAKE 1 TABLET BY MOUTH ONCE DAILY AT SUPPERTIME (APPT NEEDED), Disp: 90 tablet, Rfl: 1   progesterone (PROMETRIUM) 200 MG capsule, TAKE 1 CAPSULE BY MOUTH AT BEDTIME, Disp: 90 capsule, Rfl: 3   acetaminophen (TYLENOL) 500 MG tablet, Take 1,000 mg by mouth every 6 (six) hours as needed for moderate pain. (Patient not taking: Reported on 11/09/2021), Disp: , Rfl:    fluticasone (FLOVENT HFA) 110 MCG/ACT inhaler, INHALE 2 PUFFS INTO THE LUNGS DAILY., Disp: 12 g, Rfl: 12   Ibuprofen (MOTRIN PO), Take by mouth as needed. (Patient not taking: Reported on 11/09/2021), Disp: , Rfl:    mupirocin ointment (BACTROBAN) 2 %, Apply 1 application topically 3 (three) times daily. (Patient not taking: Reported on 11/09/2021), Disp: 22 g, Rfl: 1  EXAM:  VITALS per patient if applicable:  Vitals with BMI 11/09/2021 10/15/2021 10/15/2021  Height - - 5\' 5"   Weight 240 lbs - 240 lbs  BMI - - 02.72  Systolic - 536 -  Diastolic - 74 -  Pulse - 64 -     GENERAL: alert, oriented, appears well and in no acute distress  HEENT: atraumatic, conjunttiva clear, no obvious abnormalities on inspection of external nose and ears  NECK: normal movements of the head and neck  LUNGS: on inspection no signs of  respiratory distress, breathing rate appears normal, no obvious gross SOB, gasping or wheezing  CV: no obvious cyanosis  MS: moves all visible extremities without noticeable abnormality  PSYCH/NEURO: pleasant and cooperative, no obvious depression or anxiety, speech and thought processing grossly intact  LABS: none today    Chemistry      Component Value Date/Time   NA 139 07/20/2021 0808   K 4.4 07/20/2021 0808   CL 105 07/20/2021 0808   CO2 27 07/20/2021 0808   BUN 15 07/20/2021 0808   CREATININE 0.76 07/20/2021 0808      Component Value Date/Time   CALCIUM 8.8 07/20/2021 0808   ALKPHOS 57 10/03/2020 0832   AST 16 10/03/2020 0832   ALT 15 10/03/2020 0832   BILITOT 0.5 10/03/2020 6440  Lab Results  Component Value Date   TSH 2.47 10/03/2020   Lab Results  Component Value Date   HGBA1C 6.0 07/20/2021   Lab Results  Component Value Date   WBC 5.0 10/03/2020   HGB 12.8 10/03/2020   HCT 39.0 10/03/2020   MCV 88.3 10/03/2020   PLT 207.0 10/03/2020    ASSESSMENT AND PLAN:  Discussed the following assessment and plan:  #1 excessive daytime sleepiness.  Significant because is poor/broken sleep at night.  Due to her schedule of taking care of her husband this cannot be helped. Obstructive sleep apnea treatment with CPAP is maximized. We will do trial of stimulant to see if this helps her chronic sleepiness and hopefully this medication will also augment her antidepressants.  Start Adderall XR 20 mg every morning.  Therapeutic expectations and side effect profile of medication discussed today.  Patient's questions answered.  #2 major depressive disorder, recurrent, treatment resistant. Significantly stressful extraneous life circumstances press down on her pretty hard, understandably. Continue Wellbutrin XL 300 mg a day and fluoxetine 20 mg a day.  40 mg fluoxetine dose in the past made her feel a little cognitive slowing and fuzzy headed. As stated in #1 above  hopefully starting Adderall will help augment her antidepressants.   #3 recent fall.  Right great toe contusion/strain resolved.  Right shoulder arthralgia gradually improving.  I discussed the assessment and treatment plan with the patient. The patient was provided an opportunity to ask questions and all were answered. The patient agreed with the plan and demonstrated an understanding of the instructions.   F/u: 1 month--virtual ok d/t patient's need to be at home with debilitated husband as much as possible. Next cpe due at any time now  Signed:  Crissie Sickles, MD           11/09/2021

## 2021-11-13 ENCOUNTER — Ambulatory Visit: Payer: 59 | Attending: Internal Medicine

## 2021-11-13 DIAGNOSIS — Z23 Encounter for immunization: Secondary | ICD-10-CM

## 2021-11-13 NOTE — Progress Notes (Signed)
° °  Covid-19 Vaccination Clinic  Name:  Donna Willis    MRN: [id number] DOB: 03/30/1961  11/13/2021  [name] was observed post Covid-19 immunization for 15 minutes without incident. She was provided with Vaccine Information Sheet and instruction to access the V-Safe system.   [name] was instructed to call 911 with any severe reactions post vaccine: Difficulty breathing  Swelling of face and throat  A fast heartbeat  A bad rash all over body  Dizziness and weakness   Immunizations Administered     Name Date Dose VIS Date Route   Pfizer Covid-19 Vaccine Bivalent Booster 11/13/2021  2:51 PM 0.3 mL 07/12/2021 Intramuscular   Manufacturer: [employer]   Lot: [id number]   [place]: [phone]

## 2021-11-16 ENCOUNTER — Other Ambulatory Visit (HOSPITAL_BASED_OUTPATIENT_CLINIC_OR_DEPARTMENT_OTHER): Payer: Self-pay

## 2021-11-16 MED ORDER — PFIZER COVID-19 VAC BIVALENT 30 MCG/0.3ML IM SUSP
INTRAMUSCULAR | 0 refills | Status: DC
  Filled 2021-11-16: qty 0.3, 1d supply, fill #0

## 2021-11-20 ENCOUNTER — Other Ambulatory Visit: Payer: Self-pay | Admitting: Family Medicine

## 2021-11-21 ENCOUNTER — Other Ambulatory Visit (HOSPITAL_COMMUNITY): Payer: Self-pay

## 2021-11-21 MED ORDER — FLUOXETINE HCL 20 MG PO CAPS
ORAL_CAPSULE | Freq: Every day | ORAL | 0 refills | Status: DC
  Filled 2021-11-21: qty 30, 30d supply, fill #0

## 2021-11-21 MED ORDER — BUPROPION HCL ER (XL) 300 MG PO TB24
300.0000 mg | ORAL_TABLET | Freq: Every day | ORAL | 0 refills | Status: DC
  Filled 2021-11-21: qty 30, 30d supply, fill #0

## 2021-12-12 ENCOUNTER — Other Ambulatory Visit (HOSPITAL_COMMUNITY): Payer: Self-pay

## 2021-12-12 ENCOUNTER — Other Ambulatory Visit: Payer: Self-pay | Admitting: Family Medicine

## 2021-12-12 MED FILL — Progesterone Cap 200 MG: ORAL | 90 days supply | Qty: 90 | Fill #2 | Status: AC

## 2021-12-13 ENCOUNTER — Other Ambulatory Visit (HOSPITAL_COMMUNITY): Payer: Self-pay

## 2021-12-14 ENCOUNTER — Other Ambulatory Visit (HOSPITAL_COMMUNITY): Payer: Self-pay

## 2021-12-14 MED ORDER — AMPHETAMINE-DEXTROAMPHET ER 20 MG PO CP24
20.0000 mg | ORAL_CAPSULE | Freq: Every day | ORAL | 0 refills | Status: DC
  Filled 2021-12-14: qty 30, 30d supply, fill #0

## 2021-12-14 NOTE — Telephone Encounter (Signed)
Requesting: adderall Contract: N/A UDS: N/A Last Visit: 11/09/21 Next Visit: 2/623 Last Refill: 11/09/21(30,0)  Please Advise. Med pending

## 2021-12-15 ENCOUNTER — Other Ambulatory Visit (HOSPITAL_COMMUNITY): Payer: Self-pay

## 2021-12-18 ENCOUNTER — Telehealth (INDEPENDENT_AMBULATORY_CARE_PROVIDER_SITE_OTHER): Payer: 59 | Admitting: Family Medicine

## 2021-12-18 ENCOUNTER — Other Ambulatory Visit (HOSPITAL_COMMUNITY): Payer: Self-pay

## 2021-12-18 ENCOUNTER — Other Ambulatory Visit: Payer: Self-pay | Admitting: Family Medicine

## 2021-12-18 ENCOUNTER — Other Ambulatory Visit: Payer: Self-pay

## 2021-12-18 DIAGNOSIS — F3341 Major depressive disorder, recurrent, in partial remission: Secondary | ICD-10-CM | POA: Diagnosis not present

## 2021-12-18 DIAGNOSIS — F988 Other specified behavioral and emotional disorders with onset usually occurring in childhood and adolescence: Secondary | ICD-10-CM

## 2021-12-18 DIAGNOSIS — G4719 Other hypersomnia: Secondary | ICD-10-CM | POA: Diagnosis not present

## 2021-12-18 MED ORDER — AMPHETAMINE-DEXTROAMPHET ER 20 MG PO CP24
20.0000 mg | ORAL_CAPSULE | Freq: Every day | ORAL | 0 refills | Status: DC
  Filled 2021-12-18: qty 30, 30d supply, fill #0

## 2021-12-18 MED ORDER — BUPROPION HCL ER (XL) 150 MG PO TB24
150.0000 mg | ORAL_TABLET | Freq: Every day | ORAL | 1 refills | Status: DC
  Filled 2021-12-18: qty 30, 30d supply, fill #0

## 2021-12-18 NOTE — Progress Notes (Signed)
Virtual Visit via Video Note  I connected with Donna Willis on 12/18/21 at  3:30 PM EST by a video enabled telemedicine application and verified that I am speaking with the correct person using two identifiers.  Location patient: [place] Location provider:work or home office Persons participating in the virtual visit: patient, provider  I discussed the limitations and requested verbal permission for telemedicine visit. The patient expressed understanding and agreed to proceed.  61 y/o WF being seen today for 5 wks f/u recurrent MDD, treatment resistant.  Also f/u excessive daytime sleepiness. A/P as of last visit: "#1 excessive daytime sleepiness.  Significant because is poor/broken sleep at night.  Due to her schedule of taking care of her husband this cannot be helped. Obstructive sleep apnea treatment with CPAP is maximized. We will do trial of stimulant to see if this helps her chronic sleepiness and hopefully this medication will also augment her antidepressants.  Start Adderall XR 20 mg every morning.  Therapeutic expectations and side effect profile of medication discussed today.  Patient's questions answered.   #2 major depressive disorder, recurrent, treatment resistant. Significantly stressful extraneous life circumstances press down on her pretty hard, understandably. Continue Wellbutrin XL 300 mg a day and fluoxetine 20 mg a day.  40 mg fluoxetine dose in the past made her feel a little cognitive slowing and fuzzy headed. As stated in #1 above hopefully starting Adderall will help augment her antidepressants.    #3 recent fall.  Right great toe contusion/strain resolved.  Right shoulder arthralgia gradually improving."  INTERIM HX: Doing much better.  Focus, concentration, mood--all significantly improved since starting Adderall XR 20 mg every morning.  She has not really had any change in sleep duration or quality. Appetite stable.  She is pleased that her mood is good, she feels like  current doses of her medicines are good but does say she would like to see how she does weaning off Wellbutrin.  Allergies  Allergen Reactions   Ciprofloxacin Itching   -COVID-19 vaccine status:  Immunization History  Administered Date(s) Administered   Influenza Split 08/19/2012, 09/02/2013, 08/02/2014, 08/25/2015, 08/12/2017   Influenza,inj,Quad PF,6+ Mos 07/01/2018, 08/20/2019   Influenza-Unspecified 08/25/2020, 08/28/2021   PFIZER(Purple Top)SARS-COV-2 Vaccination 11/19/2019, 12/10/2019, 09/26/2020   Pfizer Covid-19 Vaccine Bivalent Booster 19yrs & up 11/13/2021   Tdap 04/30/2016   Zoster Recombinat (Shingrix) 06/03/2018, 08/27/2018     ROS: See pertinent positives and negatives per HPI.  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 Rosana Hoes: hasn't seen him in yrs or so, was on elmiron and coldn't tell much difference.   MDD (major depressive disorder)    Mild persistent asthma 08/31/2013   [place] pulm: stable as of pulm f/u 02/2018 and 05/2019: no med changes made---1 yr f/u recommended.   OSA on CPAP 07/2020   Personal history of  adenomatous colonic polyp 10/28/2012   10/2012 - 4x10 mm tubular adenoma; repeat 09/03/16 showed NO POLYPS.  Recall 5 yrs (08/2021).   Prediabetes 08/2019  08/20/19 HbA1c 6.4%->recommended metformin at that time.    Past Surgical History:  Procedure Laterality Date   CARDIOVASCULAR STRESS TEST  09/2012   Rest/Stress myoview: normal   CESAREAN SECTION  1988   CHOLECYSTECTOMY  2002   COLONOSCOPY W/ POLYPECTOMY   10/22/12; 09/03/16   2013; tubular adenoma, no high grade dysplasia (Dr. Carlean Purl).  Repeat 08/2016: no polyps.  Recall 5 yrs (08/2021--Dr. Carlean Purl).   CPAP  07/2020   CYSTOSCOPY     with bladder bx 07/22/18-->   EPIGASTRIC HERNIA REPAIR N/A 06/26/2019   Procedure: LAPAROSCOPIC EPIGASTRIC HERNIA REPAIR WITH MESH;  Surgeon: Alphonsa Overall, MD;  Location: [facility];  Service: General;  Laterality: N/A;   HERNIA REPAIR  2536   umbilical, during lap chole   TONSILLECTOMY AND ADENOIDECTOMY  6440   UMBILICAL HERNIA REPAIR  08/19/2012   Procedure: LAPAROSCOPIC UMBILICAL HERNIA;  Surgeon: Shann Medal, MD;  Location: [facility];  Service: General;  Laterality: N/A;     Current Outpatient Medications:    buPROPion (WELLBUTRIN XL) 150 MG 24 hr tablet, Take 1 tablet (150 mg total) by mouth daily., Disp: 30 tablet, Rfl: 1   Cinnamon 500 MG capsule, Take 500 mg by mouth daily., Disp: , Rfl:    COVID-19 mRNA bivalent vaccine, Pfizer, (PFIZER COVID-19 VAC BIVALENT) injection, Inject into the muscle., Disp: 0.3 mL, Rfl: 0   estradiol (ESTRACE) 2 MG tablet, TAKE 1 TABLET BY MOUTH ONCE A DAY, Disp: 90 tablet, Rfl: 0   famotidine (PEPCID) 20 MG tablet, Take 20 mg by mouth as needed for heartburn or indigestion., Disp: , Rfl:    FLUoxetine (PROZAC) 20 MG capsule, TAKE 1 CAPSULE BY MOUTH ONCE A DAY, Disp: 30 capsule, Rfl: 0   Ibuprofen (MOTRIN PO), Take by mouth as needed., Disp: , Rfl:    metFORMIN (GLUCOPHAGE) 500 MG tablet, TAKE 1 TABLET BY MOUTH ONCE DAILY AT SUPPERTIME (APPT NEEDED), Disp: 90 tablet, Rfl: 1   mupirocin ointment (BACTROBAN) 2 %, Apply 1 application topically 3 (three) times daily., Disp: 22 g, Rfl: 1   progesterone (PROMETRIUM) 200 MG capsule, TAKE 1 CAPSULE BY MOUTH AT BEDTIME, Disp: 90 capsule, Rfl: 3   acetaminophen (TYLENOL) 500 MG tablet, Take 1,000 mg by mouth every 6 (six) hours as needed for moderate pain. (Patient not taking: Reported on 11/09/2021), Disp: , Rfl:     amphetamine-dextroamphetamine (ADDERALL XR) 20 MG 24 hr capsule, Take 1 capsule by mouth daily., Disp: 30 capsule, Rfl: 0   fluticasone (FLOVENT HFA) 110 MCG/ACT inhaler, INHALE 2 PUFFS INTO THE LUNGS DAILY., Disp: 12 g, Rfl: 12  EXAM:  VITALS per patient if applicable:  Vitals with BMI 11/09/2021 10/15/2021 10/15/2021  Height - - 5\' 5"   Weight 240 lbs - 240 lbs  BMI - - 34.74  Systolic - 259 -  Diastolic - 74 -  Pulse - 64 -   GENERAL: alert, oriented, appears well and in no acute distress  HEENT: atraumatic, conjunttiva clear, no obvious abnormalities on inspection of external nose and ears  NECK: normal movements of the head and neck  LUNGS: on inspection no signs of respiratory distress, breathing rate appears normal, no obvious gross SOB, gasping or wheezing  CV: no obvious cyanosis  MS: moves all visible extremities without noticeable abnormality  PSYCH/NEURO: pleasant and cooperative, no obvious depression or anxiety, speech and thought processing grossly intact  LABS: none today  Lab Results  Component Value Date   TSH 2.47 10/03/2020  Chemistry      Component Value Date/Time   NA 139 07/20/2021 0808   K 4.4 07/20/2021 0808   CL 105 07/20/2021 0808   CO2 27 07/20/2021 0808   BUN 15 07/20/2021 0808   CREATININE 0.76 07/20/2021 0808      Component Value Date/Time   CALCIUM 8.8 07/20/2021 0808   ALKPHOS 57 10/03/2020 0832   AST 16 10/03/2020 0832   ALT 15 10/03/2020 0832   BILITOT 0.5 10/03/2020 0832     ASSESSMENT AND PLAN:  Discussed the following assessment and plan:  Recurrent major depressive disorder, treatment resistant.  Excessive daytime sleepiness. She has caregiver fatigue but has been hanging in there well. Adderall has been significantly helpful as augmentation to her antidepressants as well as for focus and concentration and task completion. Continue Adderall XR 20 mg every day.  We will wean back on her Wellbutrin to 150 mg a day for the  next month and reevaluate.  Continue fluoxetine 20 mg a day.    I discussed the assessment and treatment plan with the patient. The patient was provided an opportunity to ask questions and all were answered. The patient agreed with the plan and demonstrated an understanding of the instructions.   F/u: 1 mo  Signed:  Crissie Sickles, MD           12/18/2021

## 2021-12-19 ENCOUNTER — Other Ambulatory Visit (HOSPITAL_COMMUNITY): Payer: Self-pay

## 2021-12-19 MED ORDER — FLUOXETINE HCL 20 MG PO CAPS
ORAL_CAPSULE | Freq: Every day | ORAL | 1 refills | Status: DC
  Filled 2021-12-19: qty 90, 90d supply, fill #0
  Filled 2022-04-03: qty 90, 90d supply, fill #1

## 2021-12-24 ENCOUNTER — Encounter: Payer: Self-pay | Admitting: Internal Medicine

## 2021-12-27 ENCOUNTER — Other Ambulatory Visit (HOSPITAL_COMMUNITY): Payer: Self-pay

## 2021-12-27 DIAGNOSIS — Z1231 Encounter for screening mammogram for malignant neoplasm of breast: Secondary | ICD-10-CM | POA: Diagnosis not present

## 2021-12-27 DIAGNOSIS — Z1151 Encounter for screening for human papillomavirus (HPV): Secondary | ICD-10-CM | POA: Diagnosis not present

## 2021-12-27 DIAGNOSIS — Z01419 Encounter for gynecological examination (general) (routine) without abnormal findings: Secondary | ICD-10-CM | POA: Diagnosis not present

## 2021-12-27 DIAGNOSIS — Z124 Encounter for screening for malignant neoplasm of cervix: Secondary | ICD-10-CM | POA: Diagnosis not present

## 2021-12-27 DIAGNOSIS — N841 Polyp of cervix uteri: Secondary | ICD-10-CM | POA: Diagnosis not present

## 2021-12-27 DIAGNOSIS — Z6841 Body Mass Index (BMI) 40.0 and over, adult: Secondary | ICD-10-CM | POA: Diagnosis not present

## 2021-12-27 LAB — HM PAP SMEAR: HM Pap smear: NORMAL

## 2021-12-27 LAB — HM MAMMOGRAPHY

## 2021-12-27 MED ORDER — PROGESTERONE 200 MG PO CAPS
200.0000 mg | ORAL_CAPSULE | Freq: Every day | ORAL | 3 refills | Status: DC
  Filled 2021-12-27 – 2022-03-14 (×2): qty 90, 90d supply, fill #0
  Filled 2022-06-24: qty 90, 90d supply, fill #1
  Filled 2022-09-28: qty 10, 10d supply, fill #2
  Filled 2022-09-29: qty 80, 80d supply, fill #2

## 2021-12-27 MED ORDER — ESTRADIOL 2 MG PO TABS
2.0000 mg | ORAL_TABLET | Freq: Every day | ORAL | 3 refills | Status: DC
  Filled 2021-12-27 – 2022-02-09 (×2): qty 90, 90d supply, fill #0
  Filled 2022-05-29: qty 90, 90d supply, fill #1
  Filled 2022-09-03: qty 90, 90d supply, fill #2

## 2022-01-25 ENCOUNTER — Other Ambulatory Visit: Payer: Self-pay

## 2022-01-25 ENCOUNTER — Encounter: Payer: Self-pay | Admitting: Family Medicine

## 2022-01-25 ENCOUNTER — Other Ambulatory Visit (HOSPITAL_COMMUNITY): Payer: Self-pay

## 2022-01-25 ENCOUNTER — Telehealth (INDEPENDENT_AMBULATORY_CARE_PROVIDER_SITE_OTHER): Payer: 59 | Admitting: Family Medicine

## 2022-01-25 DIAGNOSIS — F330 Major depressive disorder, recurrent, mild: Secondary | ICD-10-CM

## 2022-01-25 MED ORDER — AMPHETAMINE-DEXTROAMPHET ER 20 MG PO CP24
20.0000 mg | ORAL_CAPSULE | Freq: Every day | ORAL | 0 refills | Status: DC
  Filled 2022-01-25: qty 30, 30d supply, fill #0

## 2022-01-25 MED ORDER — BUPROPION HCL ER (XL) 300 MG PO TB24
300.0000 mg | ORAL_TABLET | Freq: Every day | ORAL | 1 refills | Status: DC
  Filled 2022-01-25: qty 90, 90d supply, fill #0
  Filled 2022-05-16: qty 90, 90d supply, fill #1

## 2022-01-25 NOTE — Progress Notes (Signed)
Virtual Visit via Video Note ? ?I connected with Donna Willis ? on 01/25/22 at  3:00 PM EDT by a video enabled telemedicine application and verified that I am speaking with the correct person using two identifiers. ? Location patient:  ?Location provider:work or home office ?Persons participating in the virtual visit: patient, provider ? ?I discussed the limitations and requested verbal permission for telemedicine visit. The patient expressed understanding and agreed to proceed. ? ?CC:  ? ?61 y/o female being seen today for 1 mo f/u recurrent MDD. ?A/P as of last visit: ?"Recurrent major depressive disorder, treatment resistant.  Excessive daytime sleepiness. ?She has caregiver fatigue but has been hanging in there well. ?Adderall has been significantly helpful as augmentation to her antidepressants as well as for focus and concentration and task completion. ?Continue Adderall XR 20 mg every day.  We will wean back on her Wellbutrin to 150 mg a day for the next month and reevaluate.  Continue fluoxetine 20 mg a day." ? ?INTERIM HX: ?Not doing as well as last month.  Feels like she wants to go back up on the Wellbutrin to 300 mg. ?Mood down more consistently, more hopelessness and anhedonia. ?Has been a rough week taking care of her chronically ill husband.  She never gets rest.  She says the Adderall does help her focus in the daytime and stay on task. ?No adverse side effects. ? ? ?Allergies  ?Allergen Reactions  ? Ciprofloxacin Itching  ? ?-COVID-19 vaccine status:  ?Immunization History  ?Administered Date(s) Administered  ? Influenza Split 08/19/2012, 09/02/2013, 08/02/2014, 08/25/2015, 08/12/2017  ? Influenza,inj,Quad PF,6+ Mos 07/01/2018, 08/20/2019  ? Influenza-Unspecified 08/25/2020, 08/28/2021  ? PFIZER(Purple Top)SARS-COV-2 Vaccination 11/19/2019, 12/10/2019, 09/26/2020  ? Pension scheme manager 73yr & up 11/13/2021  ? Tdap 04/30/2016  ? Zoster Recombinat (Shingrix) 06/03/2018, 08/27/2018   ? ? ? ?ROS: See pertinent positives and negatives per HPI. ? ?Past Medical History:  ?Diagnosis Date  ? Abdominal wall hernia   ? Allergic rhinitis   ? Atypical chest pain 08/2012  ? Normal Myoview 09/2012  ? Borderline hyperlipidemia 05/2018; 08/2019  ? 2020 Fr[place] CV risk 2%  ? Contact dermatitis   ? poison ivy/oak  ? Daytime sleepiness   ? Epigastric hernia 04/2019  ? Repaired 06/26/19  ? GERD (gastroesophageal reflux disease)   ? Hay fever   ? seasonal  ? Hernia   ? 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 Interstitial cystitis   ? Dr. DRosana Hoes hasn't seen him in yrs or so, was on elmiron and coldn't tell much difference.  ? MDD (major depressive disorder)   ? Mild persistent asthma 08/31/2013  ? [employer] pulm: stable as of pulm f/u 02/2018 and 05/2019: no med changes made---1 yr f/u recommended.  ? OSA on CPAP 07/2020  ? Personal history of  adenomatous colonic polyp 10/28/2012  ? 10/2012 - 4x10 mm tubular adenoma; repeat 09/03/16 showed NO POLYPS.  Recall 5 yrs (08/2021).  ? Prediabetes 08/2019  ? 08/20/19 HbA1c 6.4%->recommended metformin at that time.  ? ? ?Past Surgical History:  ?Procedure Laterality Date  ? CARDIOVASCULAR STRESS TEST  09/2012  ? [place]: [place]  [place]  2002  ? COLONOSCOPY W/ POLYPECTOMY  10/22/12; 09/03/16  ? 2013; tubular adenoma, no high grade dysplasia (Dr. GCarlean Purl.  Repeat 08/2016: no polyps.  Recall 5 yrs (08/2021--Dr. GCarlean Purl.  ? CPAP  07/2020  ? CYSTOSCOPY    ? with bladder bx 07/22/18-->  ? EPIGASTRIC  HERNIA REPAIR N/A 06/26/2019  ? Procedure: LAPAROSCOPIC EPIGASTRIC HERNIA REPAIR WITH MESH;  Surgeon: Alphonsa Overall, MD;  Location: [facility];  Service: General;  Laterality: N/A;  ? HERNIA REPAIR  3151  ? umbilical, during lap chole  ? TONSILLECTOMY AND ADENOIDECTOMY  1974  ? UMBILICAL HERNIA REPAIR  08/19/2012  ? Procedure: LAPAROSCOPIC UMBILICAL HERNIA;  Surgeon: Shann Medal, MD;  Location: [facility];  Service: General;  Laterality: N/A;   ? ? ? ?Current Outpatient Medications:  ?  Cinnamon 500 MG capsule, Take 500 mg by mouth daily., Disp: , Rfl:  ?  estradiol (ESTRACE) 2 MG tablet, TAKE 1 TABLET BY MOUTH ONCE A DAY, Disp: 90 tablet, Rfl: 3 ?  famotidine (PEPCID) 20 MG tablet, Take 20 mg by mouth as needed for heartburn or indigestion., Disp: , Rfl:  ?  FLUoxetine (PROZAC) 20 MG capsule, TAKE 1 CAPSULE BY MOUTH ONCE A DAY, Disp: 90 capsule, Rfl: 1 ?  Ibuprofen (MOTRIN PO), Take by mouth as needed., Disp: , Rfl:  ?  metFORMIN (GLUCOPHAGE) 500 MG tablet, TAKE 1 TABLET BY MOUTH ONCE DAILY AT SUPPERTIME (APPT NEEDED), Disp: 90 tablet, Rfl: 1 ?  mupirocin ointment (BACTROBAN) 2 %, Apply 1 application topically 3 (three) times daily., Disp: 22 g, Rfl: 1 ?  progesterone (PROMETRIUM) 200 MG capsule, Take 1 capsule (200 mg total) by mouth daily., Disp: 90 capsule, Rfl: 3 ?  acetaminophen (TYLENOL) 500 MG tablet, Take 1,000 mg by mouth every 6 (six) hours as needed for moderate pain. (Patient not taking: Reported on 11/09/2021), Disp: , Rfl:  ?  amphetamine-dextroamphetamine (ADDERALL XR) 20 MG 24 hr capsule, Take 1 capsule by mouth daily., Disp: 30 capsule, Rfl: 0 ?  buPROPion (WELLBUTRIN XL) 300 MG 24 hr tablet, Take 1 tablet by mouth daily., Disp: 90 tablet, Rfl: 1 ?  fluticasone (FLOVENT HFA) 110 MCG/ACT inhaler, INHALE 2 PUFFS INTO THE LUNGS DAILY. (Patient not taking: Reported on 01/25/2022), Disp: 12 g, Rfl: 12 ? ?EXAM: ? ?VITALS per patient if applicable:  ?Vitals with BMI 11/09/2021 10/15/2021 10/15/2021  ?Height - - '5\' 5"'$   ?Weight 240 lbs - 240 lbs  ?BMI - - 39.94  ?Systolic - 761 -  ?Diastolic - 74 -  ?Pulse - 64 -  ? ? ?GENERAL: alert, oriented, appears well and in no acute distress ? ?HEENT: atraumatic, conjunttiva clear, no obvious abnormalities on inspection of external nose and ears ? ?NECK: normal movements of the head and neck ? ?LUNGS: on inspection no signs of respiratory distress, breathing rate appears normal, no obvious gross SOB, gasping  or wheezing ? ?CV: no obvious cyanosis ? ?MS: moves all visible extremities without noticeable abnormality ? ?PSYCH/NEURO: pleasant and cooperative, no obvious depression or anxiety, speech and thought processing grossly intact ? ?LABS: none today ? ?Lab Results  ?Component Value Date  ? TSH 2.47 10/03/2020  ? ?  Chemistry   ?   ?Component Value Date/Time  ? NA 139 07/20/2021 0808  ? K 4.4 07/20/2021 0808  ? CL 105 07/20/2021 0808  ? CO2 27 07/20/2021 0808  ? BUN 15 07/20/2021 0808  ? CREATININE 0.76 07/20/2021 0808  ?    ?Component Value Date/Time  ? CALCIUM 8.8 07/20/2021 0808  ? ALKPHOS 57 10/03/2020 0832  ? AST 16 10/03/2020 0832  ? ALT 15 10/03/2020 0832  ? BILITOT 0.5 10/03/2020 6073  ?  ? ?Lab Results  ?Component Value Date  ? HGBA1C 6.0 07/20/2021  ? ?ASSESSMENT AND PLAN: ? ?  Discussed the following assessment and plan: ? ?#1 major depressive disorder, recurrent, mild to moderate. ?Did not do well with drop in her Wellbutrin to 150. ?Will increase back to 300 mg a day, continue fluoxetine 20 mg a day, continue Adderall XR 20 mg a day. ?She is in the process of initiating counseling with [facility]. ? ?I discussed the assessment and treatment plan with the patient. The patient was provided an opportunity to ask questions and all were answered. The patient agreed with the plan and demonstrated an understanding of the instructions. ?  ?F/u: approx 61mo cpe and f/u dep ? ?Signed:  PCrissie Sickles MD           01/25/2022 ? ?

## 2022-01-27 ENCOUNTER — Other Ambulatory Visit: Payer: Self-pay | Admitting: Family Medicine

## 2022-01-29 ENCOUNTER — Other Ambulatory Visit (HOSPITAL_COMMUNITY): Payer: Self-pay

## 2022-01-29 MED ORDER — METFORMIN HCL 500 MG PO TABS
ORAL_TABLET | ORAL | 1 refills | Status: DC
  Filled 2022-01-29: qty 90, 90d supply, fill #0
  Filled 2022-05-05: qty 90, 90d supply, fill #1

## 2022-02-10 ENCOUNTER — Other Ambulatory Visit (HOSPITAL_COMMUNITY): Payer: Self-pay

## 2022-02-11 ENCOUNTER — Other Ambulatory Visit (HOSPITAL_COMMUNITY): Payer: Self-pay

## 2022-02-12 ENCOUNTER — Other Ambulatory Visit (HOSPITAL_COMMUNITY): Payer: Self-pay

## 2022-03-07 ENCOUNTER — Telehealth: Payer: Self-pay | Admitting: Neurology

## 2022-03-07 NOTE — Telephone Encounter (Signed)
LVM and sent mychart message advising pt of r/s needed for 6/7 appointment- MD out of office. ?

## 2022-03-14 ENCOUNTER — Other Ambulatory Visit (HOSPITAL_COMMUNITY): Payer: Self-pay

## 2022-03-14 ENCOUNTER — Other Ambulatory Visit: Payer: Self-pay | Admitting: Family Medicine

## 2022-03-15 ENCOUNTER — Other Ambulatory Visit (HOSPITAL_COMMUNITY): Payer: Self-pay

## 2022-03-15 MED ORDER — AMPHETAMINE-DEXTROAMPHET ER 20 MG PO CP24
20.0000 mg | ORAL_CAPSULE | Freq: Every day | ORAL | 0 refills | Status: DC
  Filled 2022-03-15: qty 30, 30d supply, fill #0

## 2022-03-15 NOTE — Telephone Encounter (Signed)
Requesting: adderall ?Contract: N/A ?UDS: N/A ?Last Visit: 01/25/22 ?Next Visit: 04/30/22 ?Last Refill: 01/25/22 ? ?Please Advise. Med pending ?

## 2022-03-16 DIAGNOSIS — G4733 Obstructive sleep apnea (adult) (pediatric): Secondary | ICD-10-CM | POA: Diagnosis not present

## 2022-03-17 ENCOUNTER — Other Ambulatory Visit (HOSPITAL_COMMUNITY): Payer: Self-pay

## 2022-04-04 ENCOUNTER — Other Ambulatory Visit (HOSPITAL_COMMUNITY): Payer: Self-pay

## 2022-04-17 ENCOUNTER — Other Ambulatory Visit: Payer: Self-pay | Admitting: Family Medicine

## 2022-04-17 ENCOUNTER — Other Ambulatory Visit (HOSPITAL_COMMUNITY): Payer: Self-pay

## 2022-04-18 ENCOUNTER — Ambulatory Visit: Payer: 59 | Admitting: Neurology

## 2022-04-18 ENCOUNTER — Other Ambulatory Visit (HOSPITAL_COMMUNITY): Payer: Self-pay

## 2022-04-18 MED ORDER — AMPHETAMINE-DEXTROAMPHET ER 20 MG PO CP24
20.0000 mg | ORAL_CAPSULE | Freq: Every day | ORAL | 0 refills | Status: DC
  Filled 2022-04-18: qty 30, 30d supply, fill #0

## 2022-04-18 NOTE — Telephone Encounter (Signed)
Requesting: adderall Contract: N/A UDS: N/A Last Visit: 01/25/22 Next Visit: 05/09/22 Last Refill: 03/15/22(30,0)  Please Advise. Med pending

## 2022-04-19 ENCOUNTER — Other Ambulatory Visit (HOSPITAL_COMMUNITY): Payer: Self-pay

## 2022-04-30 ENCOUNTER — Encounter: Payer: Self-pay | Admitting: Family Medicine

## 2022-04-30 ENCOUNTER — Ambulatory Visit (INDEPENDENT_AMBULATORY_CARE_PROVIDER_SITE_OTHER): Payer: 59 | Admitting: Family Medicine

## 2022-04-30 VITALS — BP 112/72 | HR 61 | Temp 97.7°F | Ht 65.0 in | Wt 231.8 lb

## 2022-04-30 DIAGNOSIS — F3341 Major depressive disorder, recurrent, in partial remission: Secondary | ICD-10-CM | POA: Diagnosis not present

## 2022-04-30 DIAGNOSIS — R7303 Prediabetes: Secondary | ICD-10-CM

## 2022-04-30 DIAGNOSIS — F988 Other specified behavioral and emotional disorders with onset usually occurring in childhood and adolescence: Secondary | ICD-10-CM

## 2022-04-30 DIAGNOSIS — Z79899 Other long term (current) drug therapy: Secondary | ICD-10-CM | POA: Diagnosis not present

## 2022-04-30 DIAGNOSIS — Z0000 Encounter for general adult medical examination without abnormal findings: Secondary | ICD-10-CM

## 2022-04-30 LAB — CBC
HCT: 38.9 % (ref 36.0–46.0)
Hemoglobin: 12.7 g/dL (ref 12.0–15.0)
MCHC: 32.6 g/dL (ref 30.0–36.0)
MCV: 90.7 fl (ref 78.0–100.0)
Platelets: 225 10*3/uL (ref 150.0–400.0)
RBC: 4.29 Mil/uL (ref 3.87–5.11)
RDW: 13.9 % (ref 11.5–15.5)
WBC: 5.4 10*3/uL (ref 4.0–10.5)

## 2022-04-30 LAB — LIPID PANEL
Cholesterol: 223 mg/dL — ABNORMAL HIGH (ref 0–200)
HDL: 55.7 mg/dL (ref >39.00)
NonHDL: 166.95
Total CHOL/HDL Ratio: 4
Triglycerides: 201 mg/dL — ABNORMAL HIGH (ref 0.0–149.0)
VLDL: 40.2 mg/dL — ABNORMAL HIGH (ref 0.0–40.0)

## 2022-04-30 LAB — COMPREHENSIVE METABOLIC PANEL
ALT: 13 U/L (ref 0–35)
AST: 13 U/L (ref 0–37)
Albumin: 4 g/dL (ref 3.5–5.2)
Alkaline Phosphatase: 49 U/L (ref 39–117)
BUN: 10 mg/dL (ref 6–23)
CO2: 28 mEq/L (ref 19–32)
Calcium: 8.7 mg/dL (ref 8.4–10.5)
Chloride: 105 mEq/L (ref 96–112)
Creatinine, Ser: 0.76 mg/dL (ref 0.40–1.20)
GFR: 85.27 mL/min (ref >60.00)
Glucose, Bld: 91 mg/dL (ref 70–99)
Potassium: 4 mEq/L (ref 3.5–5.1)
Sodium: 139 mEq/L (ref 135–145)
Total Bilirubin: 0.5 mg/dL (ref 0.2–1.2)
Total Protein: 6.9 g/dL (ref 6.0–8.3)

## 2022-04-30 LAB — TSH: TSH: 2.93 u[IU]/mL (ref 0.35–5.50)

## 2022-04-30 LAB — HEMOGLOBIN A1C: Hgb A1c MFr Bld: 5.9 % (ref 4.6–6.5)

## 2022-04-30 LAB — LDL CHOLESTEROL, DIRECT: Direct LDL: 130 mg/dL

## 2022-04-30 NOTE — Progress Notes (Signed)
Office Note 04/30/2022  CC:  Chief Complaint  Patient presents with   Annual Exam    Pt is fasting   HPI:  Patient is a 61 y.o. female who is here for annual health maintenance exam and 3 mo f/u recurrent MDD, adult ADD, and prediabetes. A/P as of last visit: "#1 major depressive disorder, recurrent, mild to moderate. Did not do well with drop in her Wellbutrin to 150. Will increase back to 300 mg a day, continue fluoxetine 20 mg a day, continue Adderall XR 20 mg a day. She is in the process of initiating counseling with [facility]."  INTERIM HX: She feels well. She feels like her psychiatric medications are working well for her.  She gets out of the house and walks most days.  PMP AWARE reviewed today: most recent rx for Adderall was filled 04/19/2022, #30, rx by me. No red flags.  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 Rosana Hoes: hasn't seen him in yrs or so, was on elmiron and coldn't tell much difference.   MDD (major depressive disorder)    Mild persistent asthma 08/31/2013   [place] pulm: stable as of pulm f/u 02/2018 and 05/2019: no med changes made---1 yr f/u recommended.   OSA on CPAP 07/2020   Personal history of  adenomatous colonic polyp 10/28/2012   10/2012 - 4x10 mm tubular adenoma; repeat 09/03/16 showed NO POLYPS.  Recall 5 yrs (08/2021).   Prediabetes 08/2019   08/20/19 HbA1c 6.4%->recommended metformin at that time.    Past Surgical History:  Procedure Laterality Date   CARDIOVASCULAR STRESS TEST  09/2012   Rest/Stress myoview:  normal   CESAREAN SECTION  1988   CHOLECYSTECTOMY  2002   COLONOSCOPY W/ POLYPECTOMY  10/22/12; 09/03/16   2013; tubular adenoma, no high grade dysplasia (Dr. Carlean Purl).  Repeat 08/2016: no polyps.  Recall 5 yrs (08/2021--Dr. Carlean Purl).   CPAP  07/2020   CYSTOSCOPY     with bladder bx 07/22/18-->   EPIGASTRIC HERNIA REPAIR N/A 06/26/2019   Procedure: LAPAROSCOPIC EPIGASTRIC HERNIA REPAIR WITH MESH;  Surgeon: Alphonsa Overall, MD;  Location: [facility];  Service: General;  Laterality: N/A;   HERNIA REPAIR  4158   umbilical, during lap chole   TONSILLECTOMY AND ADENOIDECTOMY  3094   UMBILICAL HERNIA REPAIR  08/19/2012   Procedure: LAPAROSCOPIC UMBILICAL HERNIA;  Surgeon: Shann Medal, MD;  Location: [facility];  Service: General;  Laterality: N/A;    Family History  Problem Relation Age of Onset   Colon polyps Mother    Diverticulitis Father    Other Maternal Uncle        great-stom or colon cancer   Colon cancer Maternal Uncle    Kidney failure Brother    Kidney disease Brother    Rectal cancer Neg Hx     Social History   Socioeconomic History   Marital status: Married    Spouse name: Not on file   Number of children: Not on file  Years of education: Not on file   Highest education level: Not on file  Occupational History   Not on file  Tobacco Use   Smoking status: Never   Smokeless tobacco: Never  Vaping Use   Vaping Use: Never used  Substance and Sexual Activity   Alcohol use: Yes    Comment: rare   Drug use: No   Sexual activity: Never    Birth control/protection: None  Other Topics Concern   Not on file  Social History Narrative   Married, 1 son.   Orig from [place] but has lived in [place] >20 yrs.   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   Exercise: walks 40 min 3X/week.     No T/A/Ds   Social Determinants of Health   Financial Resource Strain: Not on file  Food Insecurity: Not on file  Transportation Needs: Not on  file  Physical Activity: Not on file  Stress: Not on file  Social Connections: Not on file  Intimate Partner Violence: Not on file    Outpatient Medications Prior to Visit  Medication Sig Dispense Refill   amphetamine-dextroamphetamine (ADDERALL XR) 20 MG 24 hr capsule Take 1 capsule by mouth daily. 30 capsule 0   buPROPion (WELLBUTRIN XL) 300 MG 24 hr tablet Take 1 tablet by mouth daily. 90 tablet 1   Cinnamon 500 MG capsule Take 500 mg by mouth daily.     estradiol (ESTRACE) 2 MG tablet Take 1 tablet (2 mg total) by mouth daily. 90 tablet 3   famotidine (PEPCID) 20 MG tablet Take 20 mg by mouth as needed for heartburn or indigestion.     FLUoxetine (PROZAC) 20 MG capsule TAKE 1 CAPSULE BY MOUTH ONCE A DAY 90 capsule 1   Ibuprofen (MOTRIN PO) Take by mouth as needed.     metFORMIN (GLUCOPHAGE) 500 MG tablet TAKE 1 TABLET BY MOUTH ONCE DAILY AT SUPPERTIME (APPT NEEDED) 90 tablet 1   progesterone (PROMETRIUM) 200 MG capsule Take 1 capsule (200 mg total) by mouth daily. 90 capsule 3   acetaminophen (TYLENOL) 500 MG tablet Take 1,000 mg by mouth every 6 (six) hours as needed for moderate pain. (Patient not taking: Reported on 11/09/2021)     fluticasone (FLOVENT HFA) 110 MCG/ACT inhaler INHALE 2 PUFFS INTO THE LUNGS DAILY. (Patient not taking: Reported on 01/25/2022) 12 g 12   mupirocin ointment (BACTROBAN) 2 % Apply 1 application topically 3 (three) times daily. (Patient not taking: Reported on 04/30/2022) 22 g 1   No facility-administered medications prior to visit.    Allergies  Allergen Reactions   Ciprofloxacin Itching    ROS Review of Systems  Constitutional:  Negative for appetite change, chills, fatigue and fever.  HENT:  Negative for congestion, dental problem, ear pain and sore throat.   Eyes:  Negative for discharge, redness and visual disturbance.  Respiratory:  Negative for cough, chest tightness, shortness of breath and wheezing.   Cardiovascular:  Negative for chest  pain, palpitations and leg swelling.  Gastrointestinal:  Negative for abdominal pain, blood in stool, diarrhea, nausea and vomiting.  Genitourinary:  Negative for difficulty urinating, dysuria, flank pain, frequency, hematuria and urgency.  Musculoskeletal:  Negative for arthralgias, back pain, joint swelling, myalgias and neck stiffness.  Skin:  Negative for pallor and rash.  Neurological:  Negative for dizziness, speech difficulty, weakness and headaches.  Hematological:  Negative for adenopathy. Does not bruise/bleed easily.  Psychiatric/Behavioral:  Negative for confusion  and sleep disturbance. The patient is not nervous/anxious.     PE;    04/30/2022    9:59 AM 11/09/2021   11:22 AM 10/15/2021   11:41 PM  Vitals with BMI  Height '5\' 5"'$     Weight 231 lbs 13 oz 240 lbs   BMI 37.16    Systolic 967  893  Diastolic 72  74  Pulse 61  64    Exam chaperoned by Shepard General, CMA Gen: Alert, well appearing.  Patient is oriented to person, place, time, and situation. AFFECT: pleasant, lucid thought and speech. ENT: Ears: EACs clear, normal epithelium.  TMs with good light reflex and landmarks bilaterally.  Eyes: no injection, icteris, swelling, or exudate.  EOMI, PERRLA. Nose: no drainage or turbinate edema/swelling.  No injection or focal lesion.  Mouth: lips without lesion/swelling.  Oral mucosa pink and moist.  Dentition intact and without obvious caries or gingival swelling.  Oropharynx without erythema, exudate, or swelling.  Neck: supple/nontender.  No LAD, mass, or TM.  Carotid pulses 2+ bilaterally, without bruits. CV: RRR, no m/r/g.   LUNGS: CTA bilat, nonlabored resps, good aeration in all lung fields. ABD: soft, NT, ND, BS normal.  No hepatospenomegaly or mass.  No bruits. EXT: no clubbing, cyanosis, or edema.  Musculoskeletal: no joint swelling, erythema, warmth, or tenderness.  ROM of all joints intact. Skin - no sores or suspicious lesions or rashes or color  changes  Pertinent labs:  Lab Results  Component Value Date   TSH 2.47 10/03/2020   Lab Results  Component Value Date   WBC 5.0 10/03/2020   HGB 12.8 10/03/2020   HCT 39.0 10/03/2020   MCV 88.3 10/03/2020   PLT 207.0 10/03/2020   Lab Results  Component Value Date   CREATININE 0.76 07/20/2021   BUN 15 07/20/2021   NA 139 07/20/2021   K 4.4 07/20/2021   CL 105 07/20/2021   CO2 27 07/20/2021   Lab Results  Component Value Date   ALT 15 10/03/2020   AST 16 10/03/2020   ALKPHOS 57 10/03/2020   BILITOT 0.5 10/03/2020   Lab Results  Component Value Date   CHOL 181 10/03/2020   Lab Results  Component Value Date   HDL 58.00 10/03/2020   Lab Results  Component Value Date   LDLCALC 102 (H) 10/03/2020   Lab Results  Component Value Date   TRIG 104.0 10/03/2020   Lab Results  Component Value Date   CHOLHDL 3 10/03/2020   Lab Results  Component Value Date   HGBA1C 6.0 07/20/2021   ASSESSMENT AND PLAN:   #1 recurrent major depressive disorder, in remission. Continue fluoxetine 20 mg a day, Wellbutrin XL 300 mg a day.  #2 adult ADD. Her Adderall XR 20 mg a day helps well.  #3  Prediabetes. Doing well on metformin 500 mg every evening. Hemoglobin A1c and fasting glucose today.  #4 Health maintenance exam: Reviewed age and gender appropriate health maintenance issues (prudent diet, regular exercise, health risks of tobacco and excessive alcohol, use of seatbelts, fire alarms in home, use of sunscreen).  Also reviewed age and gender appropriate health screening as well as vaccine recommendations. Vaccines: All up-to-date Labs: Fasting health panel ordered. Cervical ca screening: Per GYN MD, [facility]. Breast ca screening: Per GYN MD, [facility]. Colon ca screening: Repeat colonoscopy due at any time now--patient to arrange.  An After Visit Summary was printed and given to the patient.  FOLLOW UP:  No  follow-ups on file.  Signed:  Crissie Sickles, MD           04/30/2022

## 2022-04-30 NOTE — Patient Instructions (Signed)

## 2022-05-05 ENCOUNTER — Other Ambulatory Visit (HOSPITAL_COMMUNITY): Payer: Self-pay

## 2022-05-16 ENCOUNTER — Other Ambulatory Visit (HOSPITAL_COMMUNITY): Payer: Self-pay

## 2022-05-16 ENCOUNTER — Emergency Department (INDEPENDENT_AMBULATORY_CARE_PROVIDER_SITE_OTHER): Payer: 59

## 2022-05-16 ENCOUNTER — Emergency Department
Admission: EM | Admit: 2022-05-16 | Discharge: 2022-05-16 | Disposition: A | Discharge status: Home / Self Care | Payer: 59 | Source: Home / Self Care

## 2022-05-16 DIAGNOSIS — M25531 Pain in right wrist: Secondary | ICD-10-CM

## 2022-05-16 DIAGNOSIS — W19XXXA Unspecified fall, initial encounter: Secondary | ICD-10-CM | POA: Diagnosis not present

## 2022-05-16 NOTE — ED Triage Notes (Signed)
Pt presents with c/o rt wrist pain after a fall 4-5 hours ago.

## 2022-05-16 NOTE — ED Provider Notes (Signed)
[name]    CSN: [id number] Arrival date & time: 05/16/22  1935      History   Chief Complaint Chief Complaint  Patient presents with   Wrist Pain    HPI Donna Willis is a 61 y.o. female.   HPI 61 year old female presents with right wrist pain after falling 4 to 5 hours ago.  PMH significant for interstitial cystitis, MDD, and OSA.  Patient is accompanied by her son this evening.  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 Rosana Hoes: hasn't seen him in yrs or so, was on elmiron and coldn't tell much difference.   MDD (major depressive disorder)    Mild persistent asthma 08/31/2013   [place] pulm: stable as of pulm f/u 02/2018 and 05/2019: no med changes made---1 yr f/u recommended.   OSA on CPAP 07/2020   Personal history of  adenomatous colonic polyp 10/28/2012   10/2012 - 4x10 mm tubular adenoma; repeat 09/03/16 showed NO POLYPS.  Recall 5 yrs (08/2021).   Prediabetes 08/2019   08/20/19 HbA1c 6.4%->recommended metformin at that time.    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 08/18/2012    Past Surgical History:  Procedure Laterality Date   CARDIOVASCULAR STRESS TEST  09/2012   Rest/Stress myoview: normal   CESAREAN SECTION  1988   CHOLECYSTECTOMY  2002   COLONOSCOPY W/ POLYPECTOMY  10/22/12; 09/03/16   2013; tubular adenoma, no high grade dysplasia (Dr. Carlean Purl).  Repeat 08/2016: no polyps.  Recall 5 yrs (08/2021--Dr. Carlean Purl).   CPAP  07/2020   CYSTOSCOPY     with bladder bx 07/22/18-->   EPIGASTRIC HERNIA REPAIR N/A 06/26/2019   Procedure: LAPAROSCOPIC EPIGASTRIC HERNIA REPAIR WITH MESH;  Surgeon: Alphonsa Overall, MD;  Location: [facility];  Service: General;  Laterality: N/A;   HERNIA REPAIR  6629   umbilical, during lap chole   TONSILLECTOMY AND ADENOIDECTOMY  4765   UMBILICAL HERNIA REPAIR  08/19/2012   Procedure: LAPAROSCOPIC UMBILICAL HERNIA;  Surgeon: Shann Medal, MD;  Location: [facility];  Service: General;  Laterality: N/A;    OB History   No obstetric history on file.      Home Medications    Prior to Admission medications   Medication Sig Start Date End Date Taking? Authorizing Provider  acetaminophen (TYLENOL) 500 MG tablet Take 1,000 mg by mouth every 6 (six) hours as needed for moderate pain. Patient not taking:  Reported on 11/09/2021    [provider]  amphetamine-dextroamphetamine (ADDERALL XR) 20 MG 24 hr capsule Take 1 capsule by mouth daily. 04/18/22   McGowen, Adrian Blackwater, MD  buPROPion (WELLBUTRIN XL) 300 MG 24 hr tablet Take 1 tablet by mouth daily. 01/25/22   McGowen, Adrian Blackwater, MD  Cinnamon 500 MG capsule Take 500 mg by mouth daily.    [provider]  estradiol (ESTRACE) 2 MG tablet Take 1 tablet (2 mg total) by mouth daily. 12/27/21     famotidine (PEPCID) 20 MG tablet Take 20 mg by mouth as needed for heartburn or indigestion.    [provider]  FLUoxetine (PROZAC) 20 MG  capsule TAKE 1 CAPSULE BY MOUTH ONCE A DAY 12/19/21 12/19/22  McGowen, Adrian Blackwater, MD  fluticasone (FLOVENT HFA) 110 MCG/ACT inhaler INHALE 2 PUFFS INTO THE LUNGS DAILY. Patient not taking: Reported on 01/25/2022 06/02/20 06/02/21  Parrett, Fonnie Mu, NP  Ibuprofen (MOTRIN PO) Take by mouth as needed.    [provider]  metFORMIN (GLUCOPHAGE) 500 MG tablet TAKE 1 TABLET BY MOUTH ONCE DAILY AT SUPPERTIME (APPT NEEDED) 01/29/22 01/29/23  McGowen, Adrian Blackwater, MD  mupirocin ointment (BACTROBAN) 2 % Apply 1 application topically 3 (three) times daily. Patient not taking: Reported on 04/30/2022 08/16/21   Tammi Sou, MD  progesterone (PROMETRIUM) 200 MG capsule Take 1 capsule (200 mg total) by mouth daily. 12/27/21       Family History Family History  Problem Relation Age of Onset   Colon polyps Mother    Diverticulitis Father    Other Maternal Uncle        great-stom or colon cancer   Colon cancer Maternal Uncle    Kidney failure Brother    Kidney disease Brother    Rectal cancer Neg Hx     Social History Social History   Tobacco Use   Smoking status: Never   Smokeless tobacco: Never  Vaping Use   Vaping Use: Never used  Substance Use Topics   Alcohol use: Yes    Comment: rare   Drug use: No     Allergies   Ciprofloxacin   Review of Systems Review of Systems  Musculoskeletal:        Right wrist pain x4 to 5 hours  All other systems reviewed and are negative.    Physical Exam Triage Vital Signs ED Triage Vitals  Enc Vitals Group     BP 05/16/22 1942 (!) 143/80     Pulse Rate 05/16/22 1942 76     Resp 05/16/22 1942 16     Temp 05/16/22 1942 98.5 F (36.9 C)     Temp Source 05/16/22 1942 Oral     SpO2 05/16/22 1942 95 %     Weight --      Height --      Head Circumference --      Peak Flow --      Pain Score 05/16/22 1944 5     Pain Loc --      Pain Edu? --      Excl. in [place]? --    No data found.  Updated Vital Signs BP (!) 143/80 (BP Location: Left Arm)    Pulse 76   Temp 98.5 F (36.9 C) (Oral)   Resp 16   SpO2 95%    Physical Exam Vitals and nursing note reviewed.  Constitutional:      General: She is not in acute distress.    Appearance:  Normal appearance. She is obese. She is not ill-appearing.  HENT:     Head: Normocephalic and atraumatic.     Mouth/Throat:     Mouth: Mucous membranes are moist.     Pharynx: Oropharynx is clear.  Eyes:     Extraocular Movements: Extraocular movements intact.     Conjunctiva/sclera: Conjunctivae normal.     Pupils: Pupils are equal, round, and reactive to light.  Cardiovascular:     Rate and Rhythm: Normal rate and regular rhythm.     Pulses: Normal pulses.     Heart sounds: Normal heart sounds. No murmur heard. Pulmonary:     Effort: Pulmonary effort is normal.     Breath sounds: Normal breath sounds. No wheezing, rhonchi or rales.  Musculoskeletal:     Cervical back: Normal range of motion and neck supple.     Comments: Right wrist (dorsum): TTP with mild soft tissue swelling, LROM with flexion/extension, pronation/supination  Skin:    General: Skin is warm and dry.  Neurological:     General: No focal deficit present.     Mental Status: She is alert and oriented to person, place, and time. Mental status is at baseline.      UC Treatments / Results  Labs (all labs ordered are listed, but only abnormal results are displayed) Labs Reviewed - No data to display  EKG   Radiology DG Wrist Complete Right  Result Date: 05/16/2022 CLINICAL DATA:  61 year old female presenting for evaluation of ulna and radius pain post fall. EXAM: RIGHT WRIST - COMPLETE 3+ VIEW COMPARISON:  None available FINDINGS: There is no evidence of fracture or dislocation. There is no evidence of arthropathy or other focal bone abnormality. Soft tissues are unremarkable. IMPRESSION: Negative. Electronically Signed   By: Zetta Bills M.D.   On: 05/16/2022 20:00    Procedures Procedures (including critical  care time)  Medications Ordered in UC Medications - No data to display  Initial Impression / Assessment and Plan / UC Course  I have reviewed the triage vital signs and the nursing notes.  Pertinent labs & imaging results that were available during my care of the patient were reviewed by me and considered in my medical decision making (see chart for details).     MDM: 1.  Acute pain of right wrist-advised/informed patient of right wrist x-ray results with hard copy provided to patient. Advised patient to RICE right wrist for 25 minutes 3 times daily for the next 3 days.  Advised may use OTC Ibuprofen 600 to 800 mg 1-2 times daily for right wrist pain.  Advised if symptoms worsen and/or unresolved please follow-up with PCP or here for further evaluation.  Wrist Ace wrap placed prior to discharge this evening.  Patient discharged home, hemodynamically stable.  Patient discharged home, hemodynamically stable.   Final Clinical Impressions(s) / UC Diagnoses   Final diagnoses:  Acute pain of right wrist     Discharge Instructions      Advised patient to RICE right wrist for 25 minutes 3 times daily for the next 3 days.  Advised may use OTC Ibuprofen 600 to 800 mg 1-2 times daily for right wrist pain.  Advised if symptoms worsen and/or unresolved please follow-up with PCP or here for further evaluation.     ED Prescriptions   None    I have reviewed the PDMP during this encounter.   Eliezer Lofts, [place] 05/16/22 2012

## 2022-05-16 NOTE — Discharge Instructions (Addendum)
Advised patient to RICE right wrist for 25 minutes 3 times daily for the next 3 days.  Advised may use OTC Ibuprofen 600 to 800 mg 1-2 times daily for right wrist pain.  Advised if symptoms worsen and/or unresolved please follow-up with PCP or here for further evaluation.

## 2022-05-29 ENCOUNTER — Other Ambulatory Visit (HOSPITAL_COMMUNITY): Payer: Self-pay

## 2022-05-29 ENCOUNTER — Other Ambulatory Visit: Payer: Self-pay | Admitting: Family Medicine

## 2022-05-29 MED ORDER — AMPHETAMINE-DEXTROAMPHET ER 20 MG PO CP24
20.0000 mg | ORAL_CAPSULE | Freq: Every day | ORAL | 0 refills | Status: DC
  Filled 2022-05-29: qty 30, 30d supply, fill #0

## 2022-05-29 NOTE — Telephone Encounter (Signed)
Requesting:adderall Contract:n/a UDS:n/a Last Visit:04/30/22 Next Visit:n/a Last Refill:04/18/22 (30,0)  Please Advise

## 2022-05-30 ENCOUNTER — Other Ambulatory Visit (HOSPITAL_COMMUNITY): Payer: Self-pay

## 2022-06-19 ENCOUNTER — Ambulatory Visit: Payer: 59 | Admitting: Neurology

## 2022-06-19 ENCOUNTER — Encounter: Payer: Self-pay | Admitting: Neurology

## 2022-06-19 VITALS — BP 123/78 | HR 63 | Ht 64.5 in | Wt 220.4 lb

## 2022-06-19 DIAGNOSIS — G4733 Obstructive sleep apnea (adult) (pediatric): Secondary | ICD-10-CM | POA: Diagnosis not present

## 2022-06-19 DIAGNOSIS — Z9989 Dependence on other enabling machines and devices: Secondary | ICD-10-CM | POA: Diagnosis not present

## 2022-06-19 NOTE — Progress Notes (Signed)
Subjective:    Patient ID: Donna Willis is a 61 y.o. female.  HPI    Interim history:   Donna Willis is a 61 year old right-handed woman with an underlying medical history of interstitial cystitis, prediabetes, reflux disease, depression, asthma, allergic rhinitis, histoplasmosis, and obesity, who presents for follow-up consultation of her obstructive sleep apnea, on CPAP therapy.  The patient is unaccompanied today. I last saw her on 04/18/21, at which time she reported doing well.  She did have increase in stress and some increase in daytime somnolence, attributed this to an increase in her antidepressant dose.   From the sleep apnea treatment standpoint she was doing well and advised to follow-up in 1 year.    Today, 06/19/2022: I reviewed CPAP compliance data from 05/18/2022 through 06/16/2022, which is a total of 30 days, during which time she used her machine 28 days with percent use days greater than 4 hours at 87%, indicating very good compliance with an average usage of 7 hours and 3 minutes, residual AHI at goal at 0.3/h, leak acceptable with the 95th percentile at 14.1 L/min on a pressure of 10 cm with EPR of 3.  She reports doing well with her machine, she is compliant with treatment and continues to benefit from it.  She is typically up-to-date with her supplies.  She uses a hybrid style fullface mask and her DME's choice home.  She had some changes in her medication regimen within the past year, Prozac is down to 20 mg daily, Wellbutrin 300 mg daily and she has been on Adderall 20 mg once daily long-acting per PCP.  Her Epworth sleepiness score is 7 out of 24.   The patient's allergies, current medications, family history, past medical history, past social history, past surgical history and problem list were reviewed and updated as appropriate.    Previously:     I saw her on 09/22/20, at which time she was compliant with her CPAP and adjusting well to treatment.  She had some residual  sleepiness but was also advised to make more time for sleep.  She reported interim stress.  She also had intermittent restless leg symptoms.   I reviewed her CPAP compliance data from 03/18/2021 through 04/16/2021, which is a total of 30 days, during which time she used her machine 29 days with percent use days greater than 4 hours at 87%, indicating very good compliance with an average usage of 6 hours and 23 minutes, residual AHI at goal at 0.3/h, leak acceptable with a 95th percentile at 8.1 L/min on a pressure of 10 cm with a PR of 3.    I first met her at the request of her primary care physician on 05/09/2020, at which time she reported snoring and daytime somnolence.  She was advised to proceed with a sleep study.  She had a baseline sleep study, followed by a CPAP titration study.  Her baseline sleep study from 05/27/2020 showed a sleep efficiency of 89%, sleep latency 1.5 minutes, REM latency delayed at 145 minutes.  She had a mildly increased percentage of stage II sleep.  She had a normal REM percentage at 20.4%.  Total AHI was in the moderate range at 26.8/h, REM AHI in the severe range at 66.4/h, supine AHI 47.1/h.  Average oxygen saturation was 94%, nadir was severe at 59% with time below 89% saturation of 192 minutes for the night.  She had a moderately increased PLM index at 31/h without significant arousals.  Mild to  moderate snoring was noted, no significant EKG changes were noted.  She was advised to proceed with a second sleep study for proper titration with CPAP. She had a CPAP titration study on 06/26/2020.  Sleep efficiency was 86%, sleep latency 12 minutes, REM latency delayed at 162 minutes.  She was fitted with a medium DreamWear full facemask and CPAP was titrated from 5 cm to a final pressure of 10 cm.  On her final titration pressure her AHI was 1.2/h, O2 nadir 91% with supine non-REM sleep achieved.  She had an increased percentage of stage II sleep and a reduced percentage of REM sleep  at 12.3%.  She had severe PLM's at 62.7/h, without significant arousals.  She was advised to proceed with CPAP therapy at home, her set up date was 08/01/2020.   I reviewed her CPAP compliance data from 08/22/2020 through 09/20/2020, which is a total of 30 days with percent use days greater than 4 hours at 93%, indicating excellent compliance with an average usage of 6 hours and 37 minutes, residual AHI at goal at 0.4/h, leak on the low side with a 95th percentile at 5 L/min on a pressure of 10 cm with EPR of 3.     05/09/20: (She reports) excessive daytime somnolence, for the past several months, particularly worse over the past 2 to 3 months.  She has dozed off while working at the computer, she has also come close to falling asleep at the wheel.  She reports a family history of sleep apnea affecting her father and 2 brothers.  She does not know if she snores, her son has not mentioned it.  Her husband had reported it in the past.  She has been working on weight loss.  She has lost about 40 pounds in the past several months.  She takes care of her husband who is disabled and her son helps take care of him at night.  She lives with her husband and her son.  She works as a Marine scientist for [employer], and incident reporting.  She is a non-smoker, drinks alcohol rarely, maybe a few times a year, and drinks caffeine in the form of coffee, 1 cup in the mornings about 3 days out of the week and 2 diet sodas per day on average.  She has nocturia about 2-3, sometimes as many as 5 times per night on average.  She has woken up with a headache at times.  She does have intermittent restless legs type symptoms.  I reviewed your office note from 04/14/2020.  Her Epworth sleepiness score is 15 out of 24, fatigue severity score is 35 out of 63.  Bedtime is generally between 10 and 11 and rise time between 5 and 6.  She had a tonsillectomy and adenoidectomy around age 34 or 41.  She has 1 cat in the household.  She has a TV on in her  bedroom typically all night.  She avoids caffeine after 2 or 3 PM.     Her Past Medical History Is Significant For: Past Medical History:  Diagnosis Date   Abdominal wall hernia    Allergic rhinitis    Atypical chest pain 08/2012   Normal Myoview 09/2012   Borderline hyperlipidemia 05/2018; 08/2019   2020 Framingham CV risk 2%   Contact dermatitis    poison ivy/oak   Daytime sleepiness    Epigastric hernia 04/2019   Repaired 06/26/19   GERD (gastroesophageal reflux disease)    Hay fever  seasonal   Hernia    History of histoplasmosis 08/31/2013   Interstitial cystitis    Dr. Rosana Hoes: hasn't seen him in yrs or so, was on elmiron and coldn't tell much difference.   MDD (major depressive disorder)    Mild persistent asthma 08/31/2013   [place] pulm: stable as of pulm f/u 02/2018 and 05/2019: no med changes made---1 yr f/u recommended.   OSA on CPAP 07/2020   Personal history of  adenomatous colonic polyp 10/28/2012   10/2012 - 4x10 mm tubular adenoma; repeat 09/03/16 showed NO POLYPS.  Recall 5 yrs (08/2021).   Prediabetes 08/2019   08/20/19 HbA1c 6.4%->recommended metformin at that time.    Her Past Surgical History Is Significant For: Past Surgical History:  Procedure Laterality Date   CARDIOVASCULAR STRESS TEST  09/2012   Rest/Stress myoview: normal   CESAREAN SECTION  1988   CHOLECYSTECTOMY  2002   COLONOSCOPY W/ POLYPECTOMY  10/22/12; 09/03/16   2013; tubular adenoma, no high grade dysplasia (Dr. Carlean Purl).  Repeat 08/2016: no polyps.  Recall 5 yrs (08/2021--Dr. Carlean Purl).   CPAP  07/2020   CYSTOSCOPY     with bladder bx 07/22/18-->   EPIGASTRIC HERNIA REPAIR N/A 06/26/2019   Procedure: LAPAROSCOPIC EPIGASTRIC HERNIA REPAIR WITH MESH;  Surgeon: Alphonsa Overall, MD;  Location: [facility];  Service: General;  Laterality: N/A;   HERNIA REPAIR  0383   umbilical, during lap chole   TONSILLECTOMY AND ADENOIDECTOMY  3383   UMBILICAL HERNIA REPAIR  08/19/2012   Procedure: LAPAROSCOPIC  UMBILICAL HERNIA;  Surgeon: Shann Medal, MD;  Location: [facility];  Service: General;  Laterality: N/A;    Her Family History Is Significant For: Family History  Problem Relation Age of Onset   Colon polyps Mother    Diverticulitis Father    Other Maternal Uncle        great-stom or colon cancer   Colon cancer Maternal Uncle    Kidney failure Brother    Kidney disease Brother    Rectal cancer Neg Hx     Her Social History Is Significant For: Social History   Socioeconomic History   Marital status: Married    Spouse name: Not on file   Number of children: Not on file   Years of education: Not on file   Highest education level: Not on file  Occupational History   Not on file  Tobacco Use   Smoking status: Never   Smokeless tobacco: Never  Vaping Use   Vaping Use: Never used  Substance and Sexual Activity   Alcohol use: Yes    Comment: rare   Drug use: No   Sexual activity: Never    Birth control/protection: None  Other Topics Concern   Not on file  Social History Narrative   Married, 1 son.   Orig from [place] but has lived in [place] >20 yrs.   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   Exercise: walks 40 min 3X/week.     No T/A/Ds   Social Determinants of Health   Financial Resource Strain: Not on file  Food Insecurity: Not on file  Transportation Needs: Not on file  Physical Activity: Not on file  Stress: Not on file  Social Connections: Not on file    Her Allergies Are:  Allergies  Allergen Reactions   Ciprofloxacin Itching  :   Her Current Medications Are:  Outpatient Encounter Medications as of 06/19/2022  Medication Sig  acetaminophen (TYLENOL) 500 MG tablet Take 1,000 mg by mouth every 6 (six) hours as needed for moderate pain.   amphetamine-dextroamphetamine (ADDERALL XR) 20 MG 24 hr capsule Take 1 capsule by mouth daily.   buPROPion (WELLBUTRIN XL) 300 MG 24 hr tablet Take 1 tablet by mouth daily.    Cinnamon 500 MG capsule Take 500 mg by mouth daily.   estradiol (ESTRACE) 2 MG tablet Take 1 tablet (2 mg total) by mouth daily.   famotidine (PEPCID) 20 MG tablet Take 20 mg by mouth as needed for heartburn or indigestion.   FLUoxetine (PROZAC) 20 MG capsule TAKE 1 CAPSULE BY MOUTH ONCE A DAY   Ibuprofen (MOTRIN PO) Take by mouth as needed.   metFORMIN (GLUCOPHAGE) 500 MG tablet TAKE 1 TABLET BY MOUTH ONCE DAILY AT SUPPERTIME (APPT NEEDED)   mupirocin ointment (BACTROBAN) 2 % Apply 1 application topically 3 (three) times daily.   progesterone (PROMETRIUM) 200 MG capsule Take 1 capsule (200 mg total) by mouth daily.   fluticasone (FLOVENT HFA) 110 MCG/ACT inhaler INHALE 2 PUFFS INTO THE LUNGS DAILY. (Patient not taking: Reported on 01/25/2022)   No facility-administered encounter medications on file as of 06/19/2022.  :  Review of Systems:  Out of a complete 14 point review of systems, all are reviewed and negative with the exception of these symptoms as listed below:  Review of Systems  Neurological:        Doing well, no concerns.  ESS 7, FSS 26.    Objective:  Neurological Exam  Physical Exam Physical Examination:   Vitals:   06/19/22 0939  BP: 123/78  Pulse: 63    General Examination: The patient is a very pleasant 61 y.o. female in no acute distress. She appears well-developed and well-nourished and well groomed.   HEENT: Normocephalic, atraumatic, pupils are equal, round and reactive to light, extraocular tracking is good without limitation. No nystagmus noted. Hearing is grossly intact. Face is symmetric with normal facial animation. Speech is clear with no dysarthria, hypophonia or voice tremor.  No carotid bruits.  Stable exam.   Chest: Clear to auscultation without wheezing, rhonchi or crackles noted.   Heart: S1+S2+0, regular and normal without murmurs, rubs or gallops noted.    Abdomen: Soft, non-tender and non-distended.   Extremities: There is trace edema left  distal lower extremity.      Skin: Warm and dry without trophic changes noted.   Musculoskeletal: exam reveals no obvious joint deformities.    Neurologically:  Mental status: The patient is awake, alert and oriented in all 4 spheres. Her immediate and remote memory, attention, language skills and fund of knowledge are appropriate. There is no evidence of aphasia, agnosia, apraxia or anomia. Speech is clear with normal prosody and enunciation. Thought process is linear. Mood is normal and affect is normal.  Cranial nerves II - XII are as described above under HEENT exam.  Motor exam: Normal bulk, strength and tone is noted. There is no obvious tremor. Fine motor skills and coordination: grossly intact.  Cerebellar testing: No dysmetria or intention tremor. There is no truncal or gait ataxia.  Sensory exam: intact to light touch in the upper and lower extremities.  Gait, station and balance: She stands easily. No veering to one side is noted. No leaning to one side is noted. Posture is age-appropriate and stance is narrow based. Gait shows normal stride length and normal pace. No problems turning are noted.   Assessment and Plan:  In summary, Donna Willis is a very pleasant 61 year old female with an underlying medical history of interstitial cystitis, prediabetes, reflux disease, depression, asthma, allergic rhinitis, histoplasmosis, and obesity, who presents for follow-up consultation of her obstructive sleep apnea, well established on CPAP therapy.  Her baseline sleep study from 05/27/2020 showed moderate to severe obstructive sleep apnea with an AHI 26.8/h, REM AHI of 66.4/h, supine AHI of 47.1/h and O2 nadir in the critical range at 59% during supine REM sleep.  She had a subsequent titration study on 06/26/2020 and did well with CPAP therapy of 10 cm. She had significant leg twitching during both studies in keeping with PLMs but no significant arousals from her leg movements.  She is compliant  with treatment and continues to do well to CPAP therapy.  She noticed improvement improvement in her daytime somnolence.  She is commended for her treatment adherence.  She is advised to continue with full compliance of her CPAP.   She is advised to follow-up routinely in this clinic to see one of our nurse practitioners in 1 year, sooner if needed.  I answered all her questions today and she was in agreement.

## 2022-06-19 NOTE — Patient Instructions (Signed)
It was nice to see you again today.  You are compliant with your CPAP, keep up the good work!    Please continue using your CPAP regularly. While your insurance requires that you use CPAP at least 4 hours each night on 70% of the nights, I recommend, that you not skip any nights and use it throughout the night if you can. Getting used to CPAP and staying with the treatment long term does take time and patience and discipline. Untreated obstructive sleep apnea when it is moderate to severe can have an adverse impact on cardiovascular health and raise her risk for heart disease, arrhythmias, hypertension, congestive heart failure, stroke and diabetes. Untreated obstructive sleep apnea causes sleep disruption, nonrestorative sleep, and sleep deprivation. This can have an impact on your day to day functioning and cause daytime sleepiness and impairment of cognitive function, memory loss, mood disturbance, and problems focussing. Using CPAP regularly can improve these symptoms. We can see you in 1 year, you can see one of our nurse practitioners as you are stable.

## 2022-06-25 ENCOUNTER — Other Ambulatory Visit (HOSPITAL_COMMUNITY): Payer: Self-pay

## 2022-07-13 ENCOUNTER — Other Ambulatory Visit (HOSPITAL_COMMUNITY): Payer: Self-pay

## 2022-07-13 ENCOUNTER — Other Ambulatory Visit: Payer: Self-pay | Admitting: Family Medicine

## 2022-07-13 NOTE — Telephone Encounter (Signed)
Requesting: Adderall Contract: N/A UDS: N/A Last Visit: 04/30/2022 Next Visit:6 mo f/u rCI Last Refill: 05/29/2022(30,0)   RF request for Prozac LOV: 04/30/2022 CPE Next ov: 6 mo f/u RCI Last written: 12/19/2021(90,01)   Please Advise. Meds pending

## 2022-07-17 ENCOUNTER — Other Ambulatory Visit (HOSPITAL_COMMUNITY): Payer: Self-pay

## 2022-07-17 MED ORDER — AMPHETAMINE-DEXTROAMPHET ER 20 MG PO CP24
20.0000 mg | ORAL_CAPSULE | Freq: Every day | ORAL | 0 refills | Status: DC
  Filled 2022-07-17: qty 30, 30d supply, fill #0

## 2022-07-17 MED ORDER — FLUOXETINE HCL 20 MG PO CAPS
ORAL_CAPSULE | Freq: Every day | ORAL | 1 refills | Status: DC
  Filled 2022-07-17: qty 90, 90d supply, fill #0
  Filled 2022-10-18: qty 90, 90d supply, fill #1

## 2022-08-10 ENCOUNTER — Encounter: Payer: Self-pay | Admitting: Internal Medicine

## 2022-08-14 ENCOUNTER — Other Ambulatory Visit (HOSPITAL_COMMUNITY): Payer: Self-pay

## 2022-08-14 ENCOUNTER — Other Ambulatory Visit: Payer: Self-pay | Admitting: Family Medicine

## 2022-08-14 DIAGNOSIS — G4733 Obstructive sleep apnea (adult) (pediatric): Secondary | ICD-10-CM | POA: Diagnosis not present

## 2022-08-14 MED ORDER — METFORMIN HCL 500 MG PO TABS
500.0000 mg | ORAL_TABLET | Freq: Every evening | ORAL | 0 refills | Status: DC
  Filled 2022-08-14: qty 90, 90d supply, fill #0

## 2022-08-23 ENCOUNTER — Other Ambulatory Visit: Payer: Self-pay | Admitting: Family Medicine

## 2022-08-24 ENCOUNTER — Other Ambulatory Visit (HOSPITAL_COMMUNITY): Payer: Self-pay

## 2022-08-24 ENCOUNTER — Other Ambulatory Visit (HOSPITAL_BASED_OUTPATIENT_CLINIC_OR_DEPARTMENT_OTHER): Payer: Self-pay

## 2022-08-24 ENCOUNTER — Telehealth: Payer: 59 | Admitting: Family Medicine

## 2022-08-24 ENCOUNTER — Other Ambulatory Visit: Payer: Self-pay | Admitting: Family Medicine

## 2022-08-24 DIAGNOSIS — S91352A Open bite, left foot, initial encounter: Secondary | ICD-10-CM

## 2022-08-24 DIAGNOSIS — W5501XA Bitten by cat, initial encounter: Secondary | ICD-10-CM

## 2022-08-24 MED ORDER — AMOXICILLIN-POT CLAVULANATE 875-125 MG PO TABS
1.0000 | ORAL_TABLET | Freq: Two times a day (BID) | ORAL | 0 refills | Status: DC
  Filled 2022-08-24: qty 20, 10d supply, fill #0

## 2022-08-24 MED ORDER — AMPHETAMINE-DEXTROAMPHET ER 20 MG PO CP24
20.0000 mg | ORAL_CAPSULE | Freq: Every day | ORAL | 0 refills | Status: DC
  Filled 2022-08-24: qty 30, 30d supply, fill #0

## 2022-08-24 MED ORDER — BUPROPION HCL ER (XL) 300 MG PO TB24
300.0000 mg | ORAL_TABLET | Freq: Every day | ORAL | 0 refills | Status: DC
  Filled 2022-08-24: qty 90, 90d supply, fill #0

## 2022-08-24 NOTE — Progress Notes (Signed)
Virtual Visit Consent   Donna Willis, you are scheduled for a virtual visit with a [facility] provider today. Just as with appointments in the office, your consent must be obtained to participate. Your consent will be active for this visit and any virtual visit you may have with one of our providers in the next 365 days. If you have a MyChart account, a copy of this consent can be sent to you electronically.  As this is a virtual visit, video technology does not allow for your provider to perform a traditional examination. This may limit your provider's ability to fully assess your condition. If your provider identifies any concerns that need to be evaluated in person or the need to arrange testing (such as labs, EKG, etc.), we will make arrangements to do so. Although advances in technology are sophisticated, we cannot ensure that it will always work on either your end or our end. If the connection with a video visit is poor, the visit may have to be switched to a telephone visit. With either a video or telephone visit, we are not always able to ensure that we have a secure connection.  By engaging in this virtual visit, you consent to the provision of healthcare and authorize for your insurance to be billed (if applicable) for the services provided during this visit. Depending on your insurance coverage, you may receive a charge related to this service.  I need to obtain your verbal consent now. Are you willing to proceed with your visit today? Donna Willis has provided verbal consent on 08/24/2022 for a virtual visit (video or telephone). Dellia Nims, FNP  Date: 08/24/2022 11:17 AM  Virtual Visit via Video Note   I, Dellia Nims, connected with  Donna Willis  ([id number], 12-19-1960) on 08/24/22 at 11:15 AM EDT by a video-enabled telemedicine application and verified that I am speaking with the correct person using two identifiers.  Location: Patient: Virtual Visit Location Patient:  Home Provider: Virtual Visit Location Provider: Home Office   I discussed the limitations of evaluation and management by telemedicine and the availability of in person appointments. The patient expressed understanding and agreed to proceed.    History of Present Illness: Donna Willis is a 61 y.o. who identifies as a female who was assigned female at birth, and is being seen today for a cat bite on her left foot yesterday. She says she has a cat that has problems and is on prozac and occasionally attacks her. Last TDAp per pt 2018. Foot is now red and draining yellow drainage with warmth. No fever.   HPI: HPI  Problems:  Patient Active Problem List   Diagnosis Date Noted   Effusion, right knee 07/26/2020   Prediabetes 12/15/2019   Incarcerated ventral hernia 06/26/2019   MDD (major depressive disorder)    Chronic interstitial cystitis 06/06/2018   Lateral epicondylitis of right elbow 01/21/2015   Right elbow pain 12/28/2014   Asthma 08/11/2014   Chronic cough 08/31/2013   Mild intermittent asthma in adult without complication 37/85/8850   Allergic rhinitis 08/31/2013   History of histoplasmosis 08/31/2013   History of colonic polyps 10/28/2012   Health maintenance examination 09/25/2012   Incarcerated umbilical hernia, repaired 08/19/2012. 08/18/2012    Allergies:  Allergies  Allergen Reactions   Ciprofloxacin Itching   Medications:  Current Outpatient Medications:    amoxicillin-clavulanate (AUGMENTIN) 875-125 MG tablet, Take 1 tablet by mouth 2 (two) times daily., Disp: 20 tablet, Rfl: 0  acetaminophen (TYLENOL) 500 MG tablet, Take 1,000 mg by mouth every 6 (six) hours as needed for moderate pain., Disp: , Rfl:    amphetamine-dextroamphetamine (ADDERALL XR) 20 MG 24 hr capsule, Take 1 capsule by mouth daily., Disp: 30 capsule, Rfl: 0   buPROPion (WELLBUTRIN XL) 300 MG 24 hr tablet, Take 1 tablet by mouth daily., Disp: 90 tablet, Rfl: 0   Cinnamon 500 MG capsule, Take 500 mg  by mouth daily., Disp: , Rfl:    estradiol (ESTRACE) 2 MG tablet, Take 1 tablet (2 mg total) by mouth daily., Disp: 90 tablet, Rfl: 3   famotidine (PEPCID) 20 MG tablet, Take 20 mg by mouth as needed for heartburn or indigestion., Disp: , Rfl:    FLUoxetine (PROZAC) 20 MG capsule, TAKE 1 CAPSULE BY MOUTH ONCE A DAY, Disp: 90 capsule, Rfl: 1   fluticasone (FLOVENT HFA) 110 MCG/ACT inhaler, INHALE 2 PUFFS INTO THE LUNGS DAILY. (Patient not taking: Reported on 01/25/2022), Disp: 12 g, Rfl: 12   Ibuprofen (MOTRIN PO), Take by mouth as needed., Disp: , Rfl:    metFORMIN (GLUCOPHAGE) 500 MG tablet, Take 1 tablet (500 mg total) by mouth every evening at suppertime (need appointment)., Disp: 90 tablet, Rfl: 0   mupirocin ointment (BACTROBAN) 2 %, Apply 1 application topically 3 (three) times daily., Disp: 22 g, Rfl: 1   progesterone (PROMETRIUM) 200 MG capsule, Take 1 capsule (200 mg total) by mouth daily., Disp: 90 capsule, Rfl: 3  Observations/Objective: Patient is well-developed, well-nourished in no acute distress.  Resting comfortably  at home.  Head is normocephalic, atraumatic.  No labored breathing.  Speech is clear and coherent with logical content.  Patient is alert and oriented at baseline.    Assessment and Plan: 1. Cat bite of left foot, initial encounter  Keep clean and dry with peroxide and bactroban. Urgent care if sx worsen. Take antibiotics with food and probiotics.   Follow Up Instructions: I discussed the assessment and treatment plan with the patient. The patient was provided an opportunity to ask questions and all were answered. The patient agreed with the plan and demonstrated an understanding of the instructions.  A copy of instructions were sent to the patient via MyChart unless otherwise noted below.     The patient was advised to call back or seek an in-person evaluation if the symptoms worsen or if the condition fails to improve as anticipated.  Time:  I spent 10  minutes with the patient via telehealth technology discussing the above problems/concerns.    Dellia Nims, FNP

## 2022-08-24 NOTE — Telephone Encounter (Signed)
Requesting: adderall '20mg'$  Contract: n/a  UDS: n/a Last Visit: 04/30/22 Next Visit: 6 mo f/u CPE Last Refill: 07/17/22(30,0)  Please Advise. Med pending

## 2022-08-24 NOTE — Patient Instructions (Signed)
Animal Bite, Adult Animal bites range from mild to serious. An animal bite can result in any of these injuries: A scratch. A deep, open cut. Broken (punctured) or torn skin. A crush injury. A bone injury. A small bite from a house pet is usually less serious than a bite from a stray or wild animal. Cat bites can be more serious because their long, thin teeth can cause deep puncture wounds that close fast, trapping bacteria inside. Stray or wild animals, such as a raccoon, fox, skunk, or bat, are at higher risk of carrying a serious infection called rabies, which they can pass to a human through a bite. A bite from one of these animals needs medical care right away and, sometimes, rabies vaccination. What increases the risk? You are more likely to be bitten by an animal if: You are around unfamiliar pets. You disturb an animal when it is eating, sleeping, or caring for its babies. You are outdoors in a place where small, wild animals roam freely. What are the signs or symptoms? Common symptoms of an animal bite include: Pain. Bleeding. Swelling. Bruising. How is this diagnosed? This condition may be diagnosed based on a physical exam and medical history. Your health care provider will examine your wound and ask for details about the animal and how the bite happened. You may also have tests, such as: Blood tests to check for infection. X-rays to check for damage to bones or joints. Taking a fluid sample from your wound and checking it for infection (culture test). How is this treated? Treatment depends on the type of animal, where the bite is on your body, and your medical history. Treatment may include: Wound care. This often includes cleaning the wound and rinsing it out (flushing it) with saline solution, which is made of salt and water. A bandage (dressing) is also often applied. In rare cases, the wound may be closed with stitches (sutures), staples, skin glue, or adhesive  strips. Antibiotic medicine to prevent or treat infection. This medicine may be prescribed in pill or ointment form. If the bite area gets infected, the medicine may be given through an IV. A tetanus shot to prevent tetanus infection. Rabies treatment to prevent rabies infection, if the animal could have rabies. Surgery. This may be done if a bite gets infected or causes damage that needs to be repaired. Follow these instructions at home: Medicines Take or apply over-the-counter and prescription medicines only as told by your health care provider. If you were prescribed an antibiotic medicine, take or apply it as told by your health care provider. Do not stop using the antibiotic even if you start to feel better. Wound care  Follow instructions from your health care provider about how to take care of your wound. Make sure you: Wash your hands with soap and water for at least 20 seconds before and after you change your dressing. If soap and water are not available, use hand sanitizer. Change your dressing as told by your health care provider. Leave sutures, skin glue, or adhesive strips in place. These skin closures may need to stay in place for 2 weeks or longer. If adhesive strip edges start to loosen and curl up, you may trim the loose edges. Do not remove adhesive strips completely unless your health care provider tells you to do that. Check your wound every day for signs of infection. Check for: More redness, swelling, or pain. More fluid or blood. Warmth. Pus or a bad smell. General  instructions  Raise (elevate) the injured area above the level of your heart while you are sitting or lying down, if this is possible. If directed, put ice on the injured area. To do this: Put ice in a plastic bag. Place a towel between your skin and the bag. Leave the ice on for 20 minutes, 2-3 times per day. Remove the ice if your skin turns bright red. This is very important. If you cannot feel pain,  heat, or cold, you have a greater risk of damage to the area. Keep all follow-up visits. This is important. Contact a health care provider if: You have more redness, swelling, or pain around your wound. Your wound feels warm to the touch. You have a fever or chills. You have a general feeling of sickness (malaise). You feel nauseous or you vomit. You have pain that does not get better. Get help right away if: You have a red streak that leads away from your wound. You have non-clear fluid or more blood coming from your wound. There is pus or a bad smell coming from your wound. You have trouble moving your injured area. You have numbness or tingling that spreads beyond your wound. Summary Animal bites can range from mild to serious. An animal bite can cause a scratch on the skin, a deep and open cut, torn or punctured skin, a crush injury, or a bone injury. A bite from a stray or wild animal needs medical care right away and, sometimes, rabies vaccination. Your health care provider will examine your wound and ask for details about the animal and how the bite happened. Treatment may include wound care, antibiotic medicine, a tetanus shot, and rabies treatment if the animal could have rabies. This information is not intended to replace advice given to you by your health care provider. Make sure you discuss any questions you have with your health care provider. Document Revised: 11/03/2021 Document Reviewed: 11/03/2021 Elsevier Patient Education  [place].

## 2022-08-27 ENCOUNTER — Ambulatory Visit (AMBULATORY_SURGERY_CENTER): Payer: Self-pay

## 2022-08-27 VITALS — Ht 64.5 in | Wt 214.0 lb

## 2022-08-27 DIAGNOSIS — Z86010 Personal history of colonic polyps: Secondary | ICD-10-CM

## 2022-08-27 NOTE — Progress Notes (Signed)
No egg or soy allergy known to patient   No issues known to pt with past sedation with any surgeries or procedures  Patient denies ever being told they had issues or difficulty with intubation   No FH of Malignant Hyperthermia  Pt is not on diet pills  Pt is not on  home 02   Pt is not on blood thinners   Pt denies issues with constipation   No A fib or A flutter  Have any cardiac testing pending--NO  Miralax prep

## 2022-09-03 ENCOUNTER — Other Ambulatory Visit (HOSPITAL_COMMUNITY): Payer: Self-pay

## 2022-09-17 IMAGING — DX DG CLAVICLE*R*
2 series · 2 of 2 positions shown · non-contrast
Comparison: None.

CLINICAL DATA: Pain after fall

EXAM:
RIGHT CLAVICLE - 2+ VIEWS

[clavicle ap]
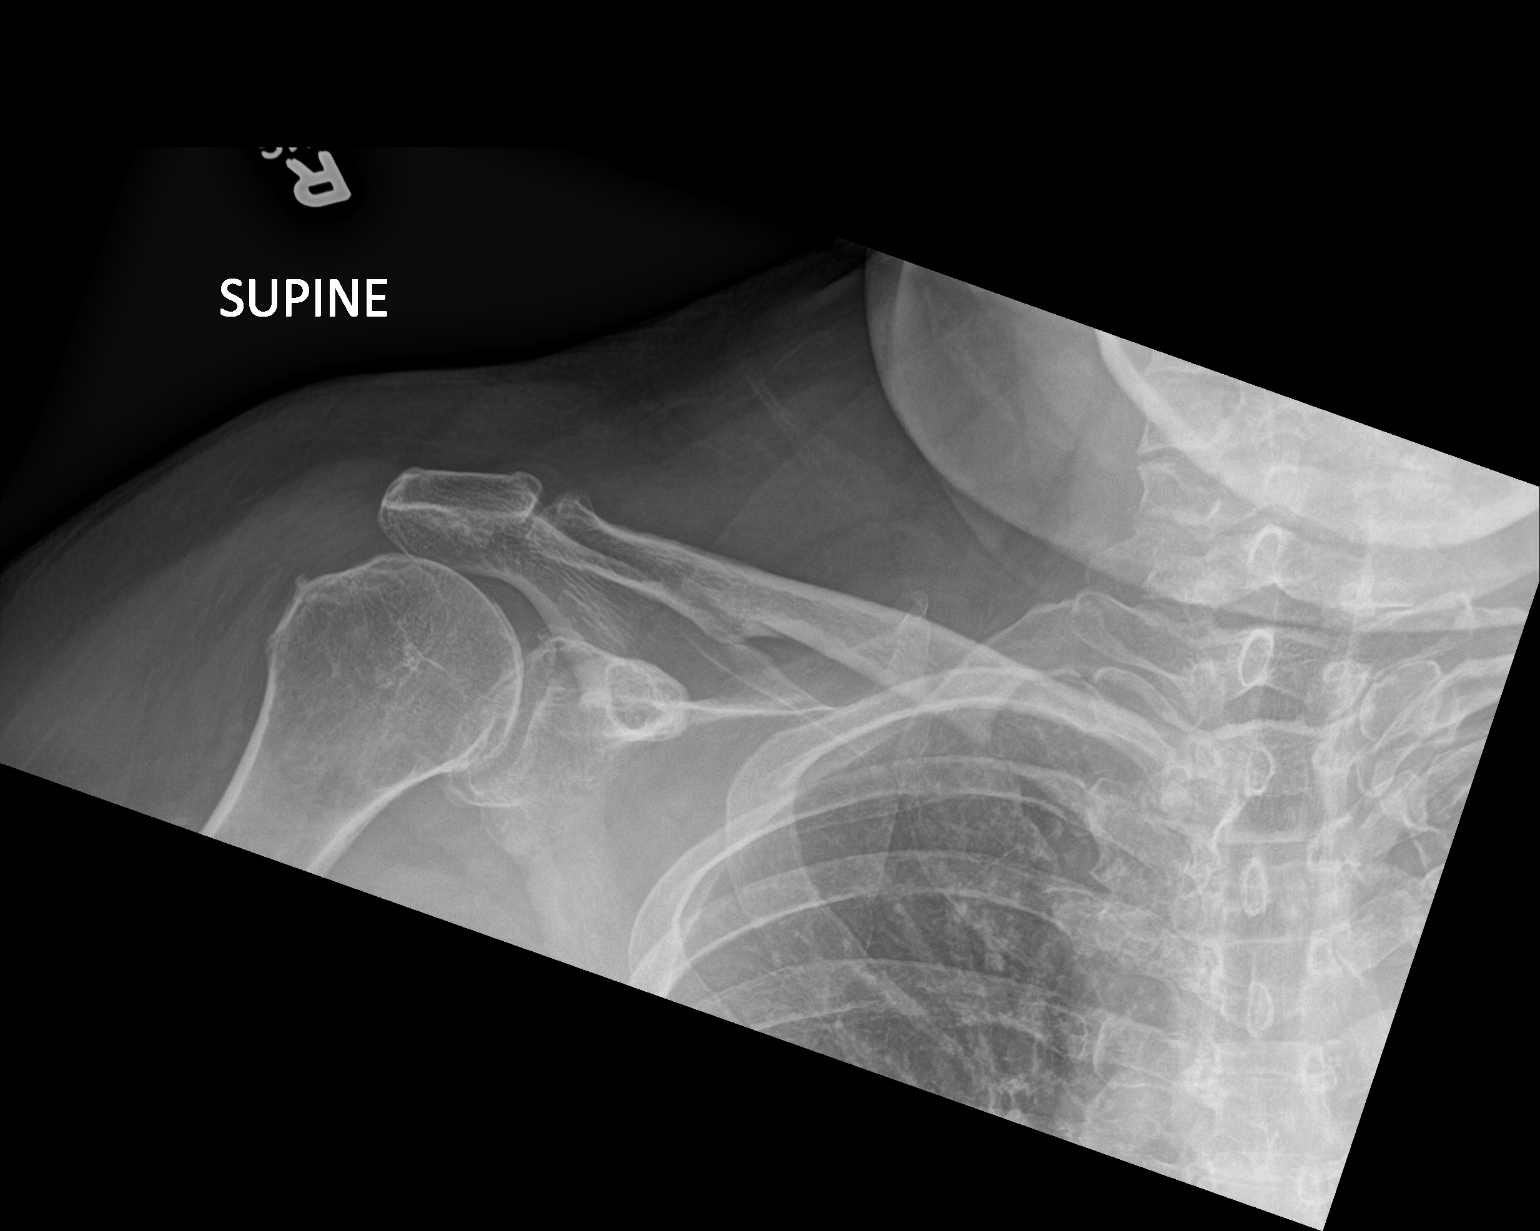

[clavicle axial]
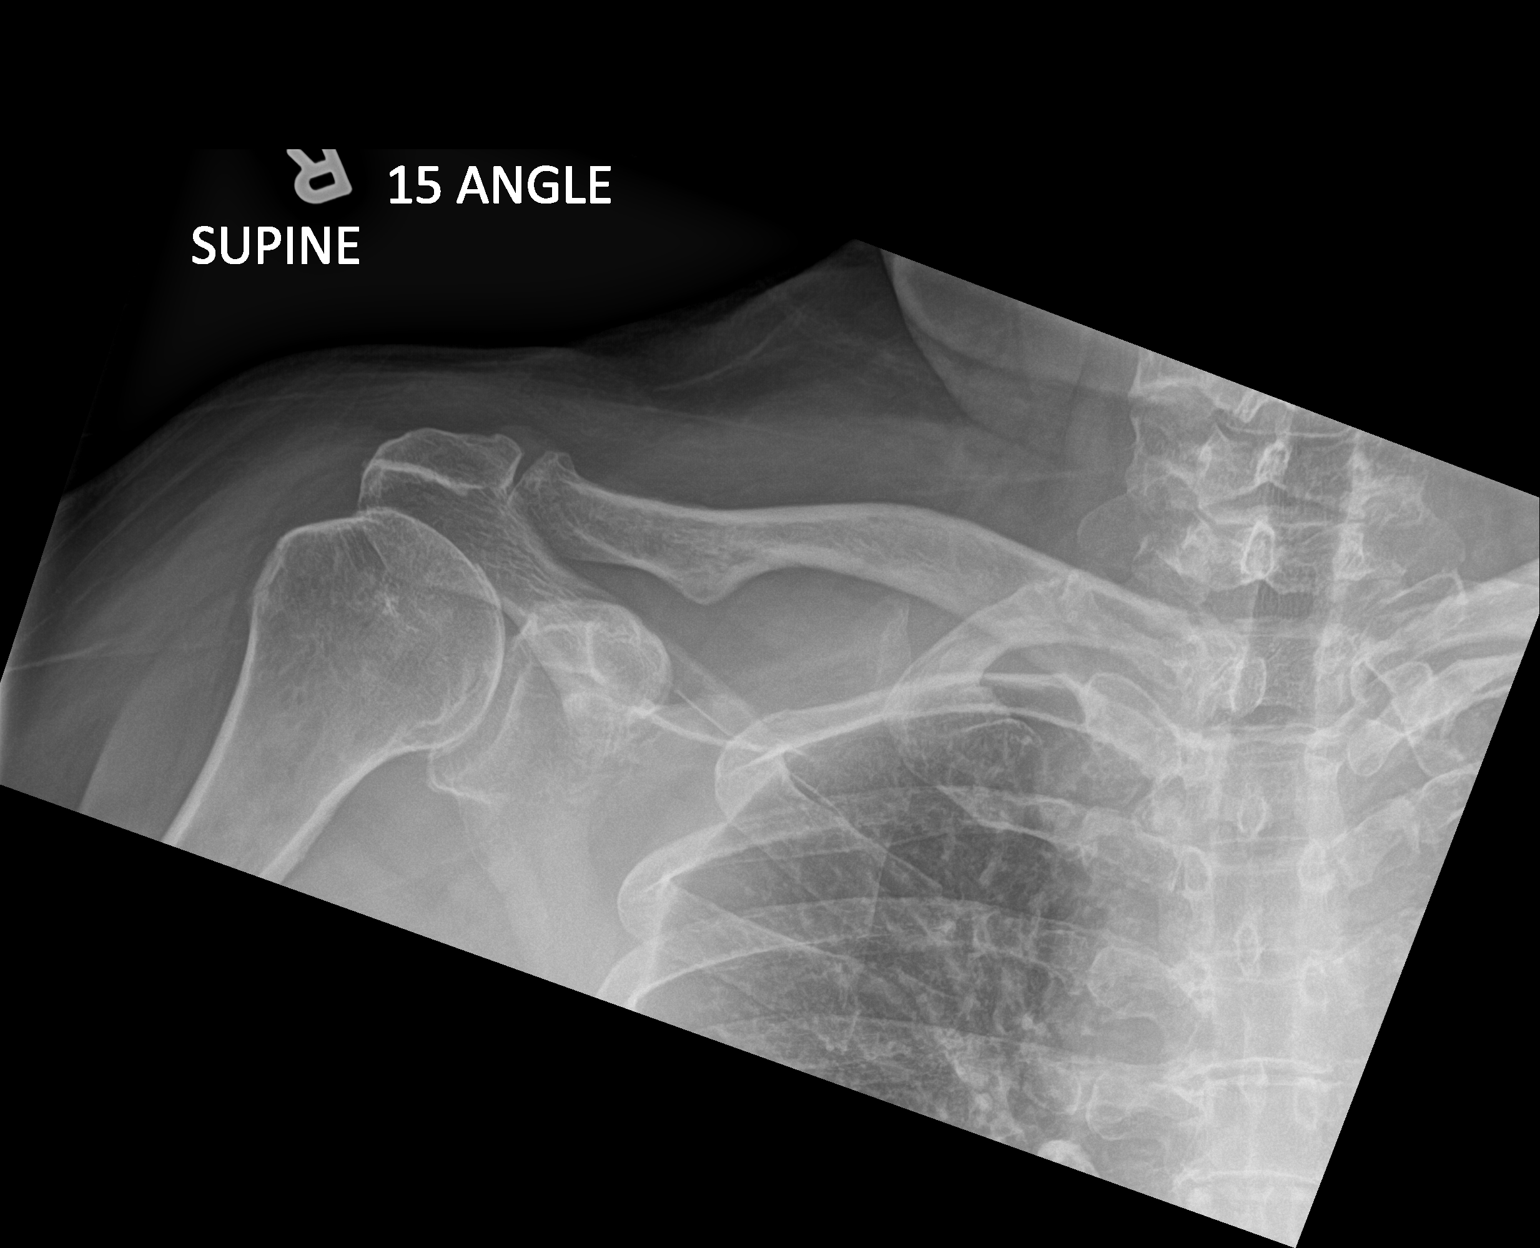

[2 of 2 positions shown; findings below may reference images not displayed]

FINDINGS: There is no evidence of fracture or other focal bone lesions. Soft
tissues are unremarkable.
IMPRESSION: Negative.

## 2022-09-17 IMAGING — DX DG FOOT COMPLETE 3+V*L*
3 series · 3 of 3 positions shown · non-contrast
Comparison: X-ray left foot 02/17/2014

CLINICAL DATA: Fall

EXAM:
LEFT FOOT - COMPLETE 3+ VIEW

[foot ap]
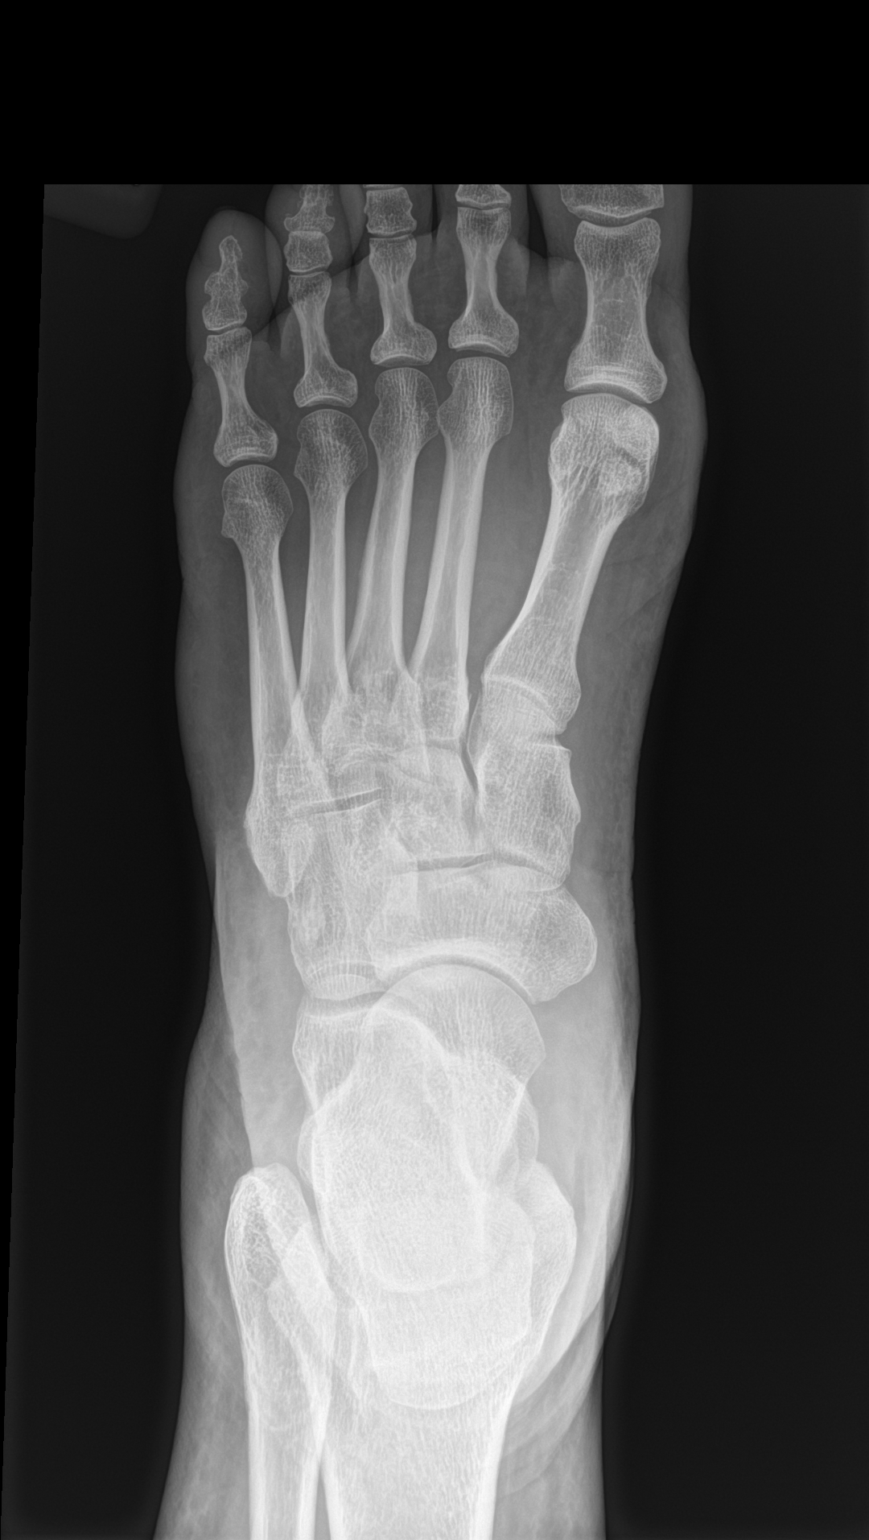

[foot obl]
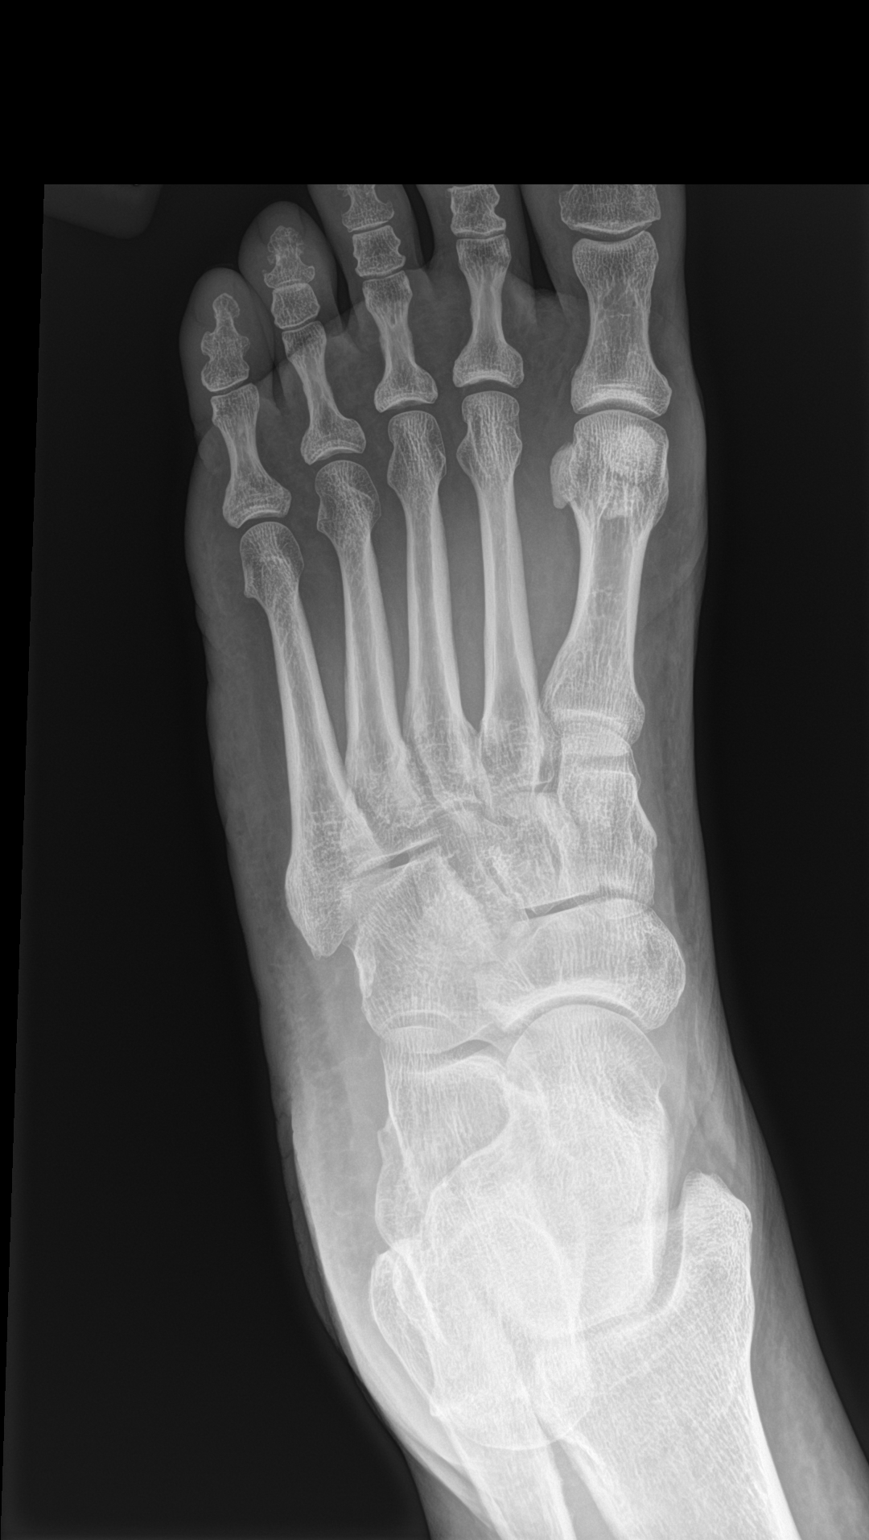

[foot lat]
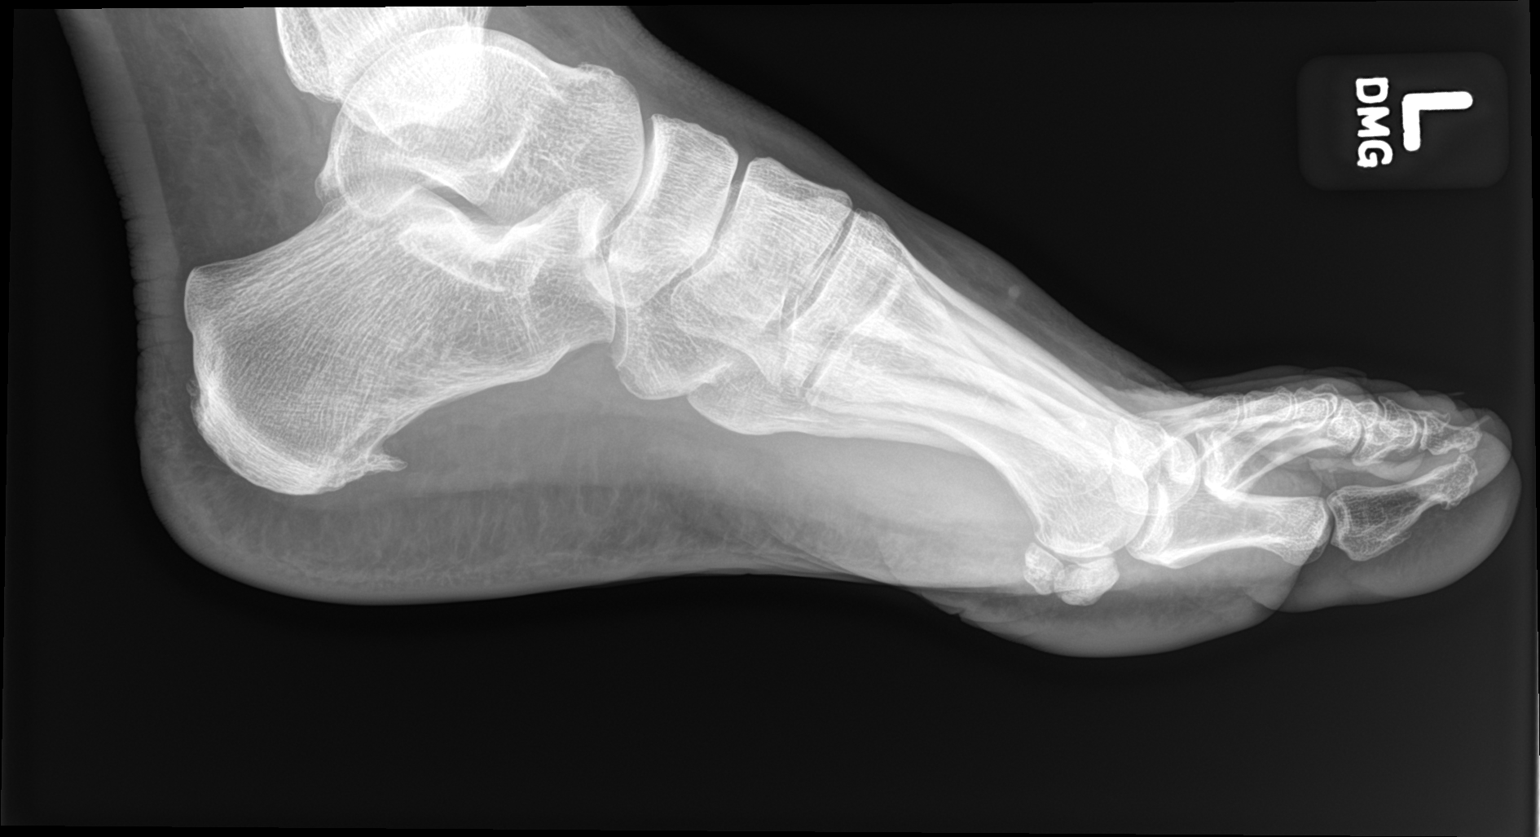

[3 of 3 positions shown; findings below may reference images not displayed]

FINDINGS: There is no evidence of fracture or dislocation. There is no
evidence of arthropathy or other focal bone abnormality. Soft
tissues are unremarkable.
IMPRESSION: Negative.

## 2022-09-20 ENCOUNTER — Encounter: Payer: Self-pay | Admitting: Internal Medicine

## 2022-09-27 ENCOUNTER — Encounter: Payer: Self-pay | Admitting: Internal Medicine

## 2022-09-27 ENCOUNTER — Ambulatory Visit (AMBULATORY_SURGERY_CENTER): Payer: 59 | Admitting: Internal Medicine

## 2022-09-27 VITALS — BP 129/57 | HR 68 | Temp 97.1°F | Resp 14 | Ht 64.0 in | Wt 214.0 lb

## 2022-09-27 DIAGNOSIS — Z09 Encounter for follow-up examination after completed treatment for conditions other than malignant neoplasm: Secondary | ICD-10-CM | POA: Diagnosis not present

## 2022-09-27 DIAGNOSIS — Z1211 Encounter for screening for malignant neoplasm of colon: Secondary | ICD-10-CM | POA: Diagnosis not present

## 2022-09-27 DIAGNOSIS — Z86010 Personal history of colonic polyps: Secondary | ICD-10-CM | POA: Diagnosis not present

## 2022-09-27 DIAGNOSIS — G4733 Obstructive sleep apnea (adult) (pediatric): Secondary | ICD-10-CM | POA: Diagnosis not present

## 2022-09-27 DIAGNOSIS — D123 Benign neoplasm of transverse colon: Secondary | ICD-10-CM | POA: Diagnosis not present

## 2022-09-27 MED ORDER — SODIUM CHLORIDE 0.9 % IV SOLN: 500.0000 mL | Freq: Once | INTRAVENOUS | Status: DC

## 2022-09-27 NOTE — Progress Notes (Signed)
Report given to PACU, vss 

## 2022-09-27 NOTE — Progress Notes (Signed)
Called to room to assist during endoscopic procedure.  Patient ID and intended procedure confirmed with present staff. Received instructions for my participation in the procedure from the performing physician.  

## 2022-09-27 NOTE — Patient Instructions (Addendum)
Handouts provided about diverticulosis and polyps.  Resume previous diet.  Continue present medications.  Await pathology results.  Repeat colonoscopy date determined after review of today's pathology results.  USE abdominal binder.  YOU HAD AN ENDOSCOPIC PROCEDURE TODAY AT [facility]:   Refer to the procedure report that was given to you for any specific questions about what was found during the examination.  If the procedure report does not answer your questions, please call your gastroenterologist to clarify.  If you requested that your care partner not be given the details of your procedure findings, then the procedure report has been included in a sealed envelope for you to review at your convenience later.  YOU SHOULD EXPECT: Some feelings of bloating in the abdomen. Passage of more gas than usual.  Walking can help get rid of the air that was put into your GI tract during the procedure and reduce the bloating. If you had a lower endoscopy (such as a colonoscopy or flexible sigmoidoscopy) you may notice spotting of blood in your stool or on the toilet paper. If you underwent a bowel prep for your procedure, you may not have a normal bowel movement for a few days.  Please Note:  You might notice some irritation and congestion in your nose or some drainage.  This is from the oxygen used during your procedure.  There is no need for concern and it should clear up in a day or so.  SYMPTOMS TO REPORT IMMEDIATELY:  Following lower endoscopy (colonoscopy or flexible sigmoidoscopy):  Excessive amounts of blood in the stool  Significant tenderness or worsening of abdominal pains  Swelling of the abdomen that is new, acute  Fever of 100F or higher  For urgent or emergent issues, a gastroenterologist can be reached at any hour by calling [phone]. Do not use MyChart messaging for urgent concerns.    DIET:  We do recommend a small meal at first, but then you may proceed to your  regular diet.  Drink plenty of fluids but you should avoid alcoholic beverages for 24 hours.  ACTIVITY:  You should plan to take it easy for the rest of today and you should NOT DRIVE or use heavy machinery until tomorrow (because of the sedation medicines used during the test).    FOLLOW UP: Our staff will call the number listed on your records the next business day following your procedure.  We will call around 7:15- 8:00 am to check on you and address any questions or concerns that you may have regarding the information given to you following your procedure. If we do not reach you, we will leave a message.     If any biopsies were taken you will be contacted by phone or by letter within the next 1-3 weeks.  Please call us at [phone] if you have not heard about the biopsies in 3 weeks.    SIGNATURES/CONFIDENTIALITY: You and/or your care partner have signed paperwork which will be entered into your electronic medical record.  These signatures attest to the fact that that the information above on your After Visit Summary has been reviewed and is understood.  Full responsibility of the confidentiality of this discharge information lies with you and/or your care-partner.I found one tiny polyp and removed it. A few diverticula seen.  The colon is redundant - a bit longer and floppy. We had to apply a hernia binder and press on you to get the scope around. You might be  a little sore in abdominal wall.  I will let you know pathology results and when to have another routine colonoscopy by mail and/or My Chart.  Please resume your typical diet and medications.  I appreciate the opportunity to care for you. Gatha Mayer, MD, [facility]

## 2022-09-27 NOTE — Op Note (Signed)
[facility] Patient Name: Donna Willis Procedure Date: 09/27/2022 12:20 PM MRN: [id number] Endoscopist: Gatha Mayer , MD, [id number] Age: 61 Referring MD:  Date of Birth: Feb 13, 1961 Gender: Female Account #: [id number] Procedure:                Colonoscopy Indications:              Surveillance: Personal history of adenomatous                            polyps on last colonoscopy > 5 years ago, Last                            colonoscopy: 2017 Medicines:                Monitored Anesthesia Care Procedure:                Pre-Anesthesia Assessment:                           - Prior to the procedure, a History and Physical                            was performed, and patient medications and                            allergies were reviewed. The patient's tolerance of                            previous anesthesia was also reviewed. The risks                            and benefits of the procedure and the sedation                            options and risks were discussed with the patient.                            All questions were answered, and informed consent                            was obtained. Prior Anticoagulants: The patient has                            taken no anticoagulant or antiplatelet agents. ASA                            Grade Assessment: II - A patient with mild systemic                            disease. After reviewing the risks and benefits,                            the patient was deemed in satisfactory condition to  undergo the procedure.                           After obtaining informed consent, the colonoscope                            was passed under direct vision. Throughout the                            procedure, the patient's blood pressure, pulse, and                            oxygen saturations were monitored continuously. The                            CF [id number] #[id number] was introduced through the  anus                            and advanced to the the cecum, identified by                            appendiceal orifice and ileocecal valve. The                            colonoscopy was performed with difficulty due to a                            redundant colon and significant looping. Successful                            completion of the procedure was aided by applying                            abdominal pressure and using an abdominal binder.                            The patient tolerated the procedure well. The                            quality of the bowel preparation was good. The                            ileocecal valve, appendiceal orifice, and rectum                            were photographed. The bowel preparation used was                            Miralax via split dose instruction. Scope In: 12:33:41 PM Scope Out: 1:01:03 PM Scope Withdrawal Time: 0 hours 9 minutes 47 seconds  Total Procedure Duration: 0 hours 27 minutes 22 seconds  Findings:                 The perianal and digital rectal examinations were  normal.                           A diminutive polyp was found in the transverse                            colon. The polyp was flat. The polyp was removed                            with a cold snare. Resection and retrieval were                            complete. Verification of patient identification                            for the specimen was done. Estimated blood loss was                            minimal.                           Scattered small-mouthed diverticula were found in                            the left colon.                           The colon (entire examined portion) was redundant.                            abdominal pressure and abdominal binder used                           The exam was otherwise without abnormality on                            direct and retroflexion views. Complications:             No immediate complications. Estimated Blood Loss:     Estimated blood loss was minimal. Impression:               - One diminutive polyp in the transverse colon,                            removed with a cold snare. Resected and retrieved.                           - Diverticulosis in the left colon.                           - Redundant colon.                           - The examination was otherwise normal on direct  and retroflexion views.                           - Personal history of colonic polyp - 2013 10 mm                            adenoma, 2017 - no polyps Recommendation:           - Patient has a contact number available for                            emergencies. The signs and symptoms of potential                            delayed complications were discussed with the                            patient. Return to normal activities tomorrow.                            Written discharge instructions were provided to the                            patient.                           - Resume previous diet.                           - Continue present medications.                           - Await pathology results.                           - Repeat colonoscopy is recommended for                            surveillance. The colonoscopy date will be                            determined after pathology results from today's                            exam become available for review. USE ABDOMINAL                            BINDER Gatha Mayer, MD 09/27/2022 1:10:10 PM This report has been signed electronically.

## 2022-09-27 NOTE — Progress Notes (Signed)
1247 Report received for lunch relief, vss William C. Wall CRNA

## 2022-09-27 NOTE — Progress Notes (Signed)
[name] History and Physical   Primary Care Physician:  Tammi Sou, MD   Reason for Procedure:   Hx adenomatous colon polyp  Plan:    colonoscopy     HPI: Donna Willis is a 61 y.o. 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 Rosana Hoes: hasn't seen him in yrs or so, was on elmiron and coldn't tell much difference.   MDD (major depressive disorder)    Mild persistent asthma 08/31/2013    pulm: stable as of pulm f/u 02/2018 and 05/2019: no med changes made---1 yr f/u recommended.   OSA on CPAP 07/2020   Personal history of  adenomatous colonic polyp 10/28/2012   10/2012 - 4x10 mm tubular adenoma; repeat 09/03/16 showed NO POLYPS.  Recall 5 yrs (08/2021).   Prediabetes 08/2019   08/20/19 HbA1c 6.4%->recommended metformin at that time.   Sleep apnea     Past Surgical History:  Procedure Laterality Date   CARDIOVASCULAR STRESS TEST  09/2012   Rest/Stress myoview: normal   CESAREAN SECTION  1988   CHOLECYSTECTOMY  2002   COLONOSCOPY     COLONOSCOPY W/ POLYPECTOMY  10/22/12; 09/03/16   2013; tubular adenoma, no high grade dysplasia (Dr. Carlean Purl).  Repeat 08/2016: no polyps.  Recall 5 yrs (08/2021--Dr. Carlean Purl).   CPAP  07/2020   CYSTOSCOPY     with bladder bx 07/22/18-->   EPIGASTRIC HERNIA REPAIR N/A 06/26/2019   Procedure: LAPAROSCOPIC EPIGASTRIC HERNIA REPAIR WITH MESH;  Surgeon: Alphonsa Overall, MD;  Location: [facility];  Service:  General;  Laterality: N/A;   HERNIA REPAIR  5465   umbilical, during lap chole   POLYPECTOMY     TONSILLECTOMY AND ADENOIDECTOMY  0354   UMBILICAL HERNIA REPAIR  08/19/2012   Procedure: LAPAROSCOPIC UMBILICAL HERNIA;  Surgeon: Shann Medal, MD;  Location: [facility];  Service: General;  Laterality: N/A;    Prior to Admission medications   Medication Sig Start Date End Date Taking? Authorizing Provider  amphetamine-dextroamphetamine (ADDERALL XR) 20 MG 24 hr capsule Take 1 capsule (20 mg total) by mouth daily. 08/24/22  Yes McGowen, Adrian Blackwater, MD  buPROPion (WELLBUTRIN XL) 300 MG 24 hr tablet Take 1 tablet (300 mg total) by mouth daily. 08/24/22  Yes McGowen, Adrian Blackwater, MD  Cinnamon 500 MG capsule Take 500 mg by mouth daily.   Yes [provider]  estradiol (ESTRACE) 2 MG tablet Take 1 tablet (2 mg total) by mouth daily. 12/27/21  Yes   famotidine (PEPCID) 20 MG tablet Take 20 mg by mouth as needed for heartburn or indigestion.   Yes [provider]  FLUoxetine (PROZAC) 20 MG capsule TAKE 1 CAPSULE BY MOUTH ONCE A DAY 07/17/22 07/17/23 Yes McGowen, Adrian Blackwater, MD  Lactobacillus (ACIDOPHILUS) 100 MG CAPS Take by mouth in the morning and at bedtime.   Yes [provider]  Melatonin 10 MG CAPS Take by mouth.   Yes [provider]  metFORMIN (GLUCOPHAGE) 500 MG tablet Take 1 tablet (500 mg total) by mouth every evening at suppertime (need appointment). 08/14/22  Yes McGowen, Adrian Blackwater, MD  progesterone (PROMETRIUM) 200 MG capsule Take 1 capsule (200 mg total) by mouth daily. 12/27/21  Yes   acetaminophen (TYLENOL) 500 MG tablet Take 1,000 mg by mouth every 6 (six) hours as needed for moderate pain.    [provider]  fluticasone (FLOVENT HFA) 110 MCG/ACT inhaler INHALE 2 PUFFS INTO THE LUNGS DAILY. Patient not taking: Reported on 01/25/2022 06/02/20 06/02/21  Parrett, Fonnie Mu, NP  Ibuprofen (MOTRIN PO) Take by mouth as needed.    [provider]  mupirocin  ointment (BACTROBAN) 2 % Apply 1 application topically 3 (three) times daily. 08/16/21   McGowenAdrian Blackwater, MD    Current Outpatient Medications  Medication Sig Dispense Refill   amphetamine-dextroamphetamine (ADDERALL XR) 20 MG 24 hr capsule Take 1 capsule (20 mg total) by mouth daily. 30 capsule 0   buPROPion (WELLBUTRIN XL) 300 MG 24 hr tablet Take 1 tablet (300 mg total) by mouth daily. 90 tablet 0   Cinnamon 500 MG capsule Take 500 mg by mouth daily.     estradiol (ESTRACE) 2 MG tablet Take 1 tablet (2 mg total) by mouth daily. 90 tablet 3   famotidine (PEPCID) 20 MG tablet Take 20 mg by mouth as needed for heartburn or indigestion.     FLUoxetine (PROZAC) 20 MG capsule TAKE 1 CAPSULE BY MOUTH ONCE A DAY 90 capsule 1   Lactobacillus (ACIDOPHILUS) 100 MG CAPS Take by mouth in the morning and at bedtime.     Melatonin 10 MG CAPS Take by mouth.     metFORMIN (GLUCOPHAGE) 500 MG tablet Take 1 tablet (500 mg total) by mouth every evening at suppertime (need appointment). 90 tablet 0   progesterone (PROMETRIUM) 200 MG capsule Take 1 capsule (200 mg total) by mouth daily. 90 capsule 3   acetaminophen (TYLENOL) 500 MG tablet Take 1,000 mg by mouth every 6 (six) hours as needed for moderate pain.     fluticasone (FLOVENT HFA) 110 MCG/ACT inhaler INHALE 2 PUFFS INTO THE LUNGS DAILY. (Patient not taking: Reported on 01/25/2022) 12 g 12   Ibuprofen (MOTRIN PO) Take by mouth as needed.     mupirocin ointment (BACTROBAN) 2 % Apply 1 application topically 3 (three) times daily. 22 g 1   Current Facility-Administered Medications  Medication Dose Route Frequency Provider Last Rate Last Admin   0.9 %  sodium chloride infusion  500 mL Intravenous Once Gatha Mayer, MD        Allergies as of 09/27/2022 - Review Complete 09/27/2022  Allergen Reaction Noted   Ciprofloxacin Itching 06/29/2012    Family History  Problem Relation Age of Onset   Colon polyps Mother    Diverticulitis Father    Kidney  failure Brother    Kidney disease Brother    Other Maternal Uncle        great-stom or colon cancer   Colon cancer Maternal Uncle    Rectal cancer Neg Hx    Esophageal cancer Neg Hx    Stomach cancer Neg Hx     Social History   Socioeconomic History   Marital status: Married    Spouse name: Not on file   Number of children: Not on file   Years of education: Not on file  Highest education level: Not on file  Occupational History   Not on file  Tobacco Use   Smoking status: Never   Smokeless tobacco: Never  Vaping Use   Vaping Use: Never used  Substance and Sexual Activity   Alcohol use: Not Currently    Comment: once per year   Drug use: Never   Sexual activity: Not Currently    Birth control/protection: Post-menopausal  Other Topics Concern   Not on file  Social History Narrative   Married, 1 son.   Orig from [place] but has lived in [place] >20 yrs.   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   Exercise: walks 40 min 3X/week.     No T/A/Ds   Social Determinants of Health   Financial Resource Strain: Not on file  Food Insecurity: Not on file  Transportation Needs: Not on file  Physical Activity: Not on file  Stress: Not on file  Social Connections: Not on file  Intimate Partner Violence: Not on file    Review of Systems:  All other review of systems negative except as mentioned in the HPI.  Physical Exam: Vital signs BP 124/78   Pulse 72   Temp (!) 97.1 F (36.2 C)   Ht '5\' 4"'$  (1.626 m)   Wt 214 lb (97.1 kg)   LMP 05/30/2019 (Exact Date)   SpO2 99%   BMI 36.73 kg/m   General:   Alert,  Well-developed, well-nourished, pleasant and cooperative in NAD Lungs:  Clear throughout to auscultation.   Heart:  Regular rate and rhythm; no murmurs, clicks, rubs,  or gallops. Abdomen:  Soft, nontender and nondistended. Normal bowel sounds.   Neuro/Psych:  Alert and cooperative. Normal mood and affect. A and O x  3   '@Donna Sweeden'$  Simonne Maffucci, MD, [facility] [phone] (pager) 09/27/2022 12:24 PM@

## 2022-09-27 NOTE — Progress Notes (Signed)
Pt's states no medical or surgical changes since previsit or office visit. 

## 2022-09-28 ENCOUNTER — Telehealth: Payer: Self-pay

## 2022-09-28 NOTE — Telephone Encounter (Signed)
Follow up call placed, VM obtained and message left. 

## 2022-09-29 ENCOUNTER — Other Ambulatory Visit (HOSPITAL_COMMUNITY): Payer: Self-pay

## 2022-10-03 ENCOUNTER — Encounter: Payer: Self-pay | Admitting: Internal Medicine

## 2022-10-03 ENCOUNTER — Other Ambulatory Visit (HOSPITAL_COMMUNITY): Payer: Self-pay

## 2022-10-18 ENCOUNTER — Other Ambulatory Visit: Payer: Self-pay | Admitting: Family Medicine

## 2022-10-18 ENCOUNTER — Other Ambulatory Visit (HOSPITAL_COMMUNITY): Payer: Self-pay

## 2022-10-18 MED ORDER — AMPHETAMINE-DEXTROAMPHET ER 20 MG PO CP24
20.0000 mg | ORAL_CAPSULE | Freq: Every day | ORAL | 0 refills | Status: DC
  Filled 2022-10-18: qty 30, 30d supply, fill #0

## 2022-10-18 NOTE — Telephone Encounter (Signed)
Requesting: adderall Contract: n/a UDS: n/a Last Visit: 04/30/22 Next Visit: n/a Last Refill: 08/24/22(30,0)  Please Advise. Med pending

## 2022-10-18 NOTE — Telephone Encounter (Signed)
Adderall #30 sent. Patient due for follow-up.

## 2022-11-19 ENCOUNTER — Other Ambulatory Visit: Payer: Self-pay | Admitting: Family Medicine

## 2022-11-19 ENCOUNTER — Other Ambulatory Visit (HOSPITAL_COMMUNITY): Payer: Self-pay

## 2022-11-19 MED ORDER — METFORMIN HCL 500 MG PO TABS
500.0000 mg | ORAL_TABLET | Freq: Every evening | ORAL | 0 refills | Status: DC
  Filled 2022-11-19: qty 30, 30d supply, fill #0

## 2022-11-20 ENCOUNTER — Encounter (HOSPITAL_COMMUNITY): Payer: Self-pay

## 2022-11-20 ENCOUNTER — Other Ambulatory Visit (HOSPITAL_COMMUNITY): Payer: Self-pay

## 2022-11-22 ENCOUNTER — Ambulatory Visit (INDEPENDENT_AMBULATORY_CARE_PROVIDER_SITE_OTHER): Payer: Commercial Managed Care - PPO | Admitting: Family Medicine

## 2022-11-22 ENCOUNTER — Other Ambulatory Visit (HOSPITAL_BASED_OUTPATIENT_CLINIC_OR_DEPARTMENT_OTHER): Payer: Self-pay

## 2022-11-22 ENCOUNTER — Other Ambulatory Visit (HOSPITAL_COMMUNITY): Payer: Self-pay

## 2022-11-22 ENCOUNTER — Encounter (HOSPITAL_BASED_OUTPATIENT_CLINIC_OR_DEPARTMENT_OTHER): Payer: Self-pay

## 2022-11-22 ENCOUNTER — Other Ambulatory Visit: Payer: Self-pay

## 2022-11-22 VITALS — BP 126/75 | HR 61 | Temp 98.6°F | Wt 231.0 lb

## 2022-11-22 DIAGNOSIS — F988 Other specified behavioral and emotional disorders with onset usually occurring in childhood and adolescence: Secondary | ICD-10-CM

## 2022-11-22 DIAGNOSIS — R7303 Prediabetes: Secondary | ICD-10-CM

## 2022-11-22 DIAGNOSIS — Z23 Encounter for immunization: Secondary | ICD-10-CM

## 2022-11-22 DIAGNOSIS — W5503XA Scratched by cat, initial encounter: Secondary | ICD-10-CM | POA: Diagnosis not present

## 2022-11-22 DIAGNOSIS — F3342 Major depressive disorder, recurrent, in full remission: Secondary | ICD-10-CM | POA: Diagnosis not present

## 2022-11-22 LAB — POCT GLYCOSYLATED HEMOGLOBIN (HGB A1C)
HbA1c POC (<> result, manual entry): 5.3 % (ref 4.0–5.6)
HbA1c, POC (controlled diabetic range): 5.3 % (ref 0.0–7.0)
HbA1c, POC (prediabetic range): 5.3 % — AB (ref 5.7–6.4)
Hemoglobin A1C: 5.3 % (ref 4.0–5.6)

## 2022-11-22 MED ORDER — BUPROPION HCL ER (XL) 300 MG PO TB24
300.0000 mg | ORAL_TABLET | Freq: Every day | ORAL | 1 refills | Status: DC
  Filled 2022-11-22: qty 90, 90d supply, fill #0
  Filled 2023-03-21: qty 90, 90d supply, fill #1

## 2022-11-22 MED ORDER — AMPHETAMINE-DEXTROAMPHETAMINE 10 MG PO TABS
10.0000 mg | ORAL_TABLET | Freq: Every day | ORAL | 0 refills | Status: DC
  Filled 2022-11-22: qty 90, 90d supply, fill #0

## 2022-11-22 MED ORDER — FLUOXETINE HCL 20 MG PO CAPS
20.0000 mg | ORAL_CAPSULE | Freq: Every day | ORAL | 1 refills | Status: DC
  Filled 2022-11-22 – 2023-01-23 (×3): qty 90, 90d supply, fill #0
  Filled 2023-04-23: qty 90, 90d supply, fill #1

## 2022-11-22 MED ORDER — METFORMIN HCL 500 MG PO TABS
500.0000 mg | ORAL_TABLET | Freq: Every evening | ORAL | 1 refills | Status: DC
  Filled 2022-11-22 (×2): qty 90, 90d supply, fill #0
  Filled 2023-02-17: qty 90, 90d supply, fill #1

## 2022-11-22 MED ORDER — AMPHETAMINE-DEXTROAMPHET ER 30 MG PO CP24
30.0000 mg | ORAL_CAPSULE | Freq: Every day | ORAL | 0 refills | Status: DC
  Filled 2022-11-22: qty 90, 90d supply, fill #0

## 2022-11-22 NOTE — Addendum Note (Signed)
Addended by: Beatrix Fetters on: 11/22/2022 11:19 AM   Modules accepted: Orders

## 2022-11-22 NOTE — Progress Notes (Signed)
OFFICE VISIT  11/22/2022  CC:  Chief Complaint  Patient presents with   Depression    Patient is a 62 y.o. female who presents for 80-monthfollow-up anxiety and depression, adult ADD, and prediabetes.  INTERIM HX: NJournifeels well. The last week or so has had some trouble initiating sleep and occasionally waking up at night--this has been unusual for her. Her son has moved back home so possibly this is playing a role. Says her mood is good, anxiety level minimal.  Feels like things are going well from that front--on fluoxetine 20 mg a day and Wellbutrin XL 300 mg a day.  Feels like she is eating a good diet.  She is active.  Taking metformin 500 mg at supper without problem. No home glucose monitoring.  She has had improved focus, concentration, task completion, Less frustration, better multitasking, less impulsivity and restlessness--> since getting on Adderall but does feel like it does not help as well as it used to and it only lasts 6 to 8 hours. No side effects from the medication.  Her Scratched her pretty bad on her left forearm yesterday. This is not unusual for this To do.  ROS as above, plus--> no fevers, no CP, no SOB, no wheezing, no cough, no dizziness, no HAs, no rashes, no melena/hematochezia.  No polyuria or polydipsia.  No myalgias or arthralgias.  No focal weakness, paresthesias, or tremors.  No acute vision or hearing abnormalities.  No dysuria or unusual/new urinary urgency or frequency.  No recent changes in lower legs. No n/v/d or abd pain.  No palpitations.    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 DRosana Hoes hasn't seen him in yrs or so, was on elmiron and coldn't tell much difference.   MDD (major depressive disorder)    Mild persistent asthma 08/31/2013   [place] pulm: stable as of pulm f/u 02/2018 and 05/2019: no med changes made---1 yr f/u recommended.   OSA on CPAP 07/2020   Personal history of  adenomatous colonic polyp 10/28/2012   10/2012 - 4x10 mm tubular adenoma; repeat 09/03/16 showed NO POLYPS.  Recall 5 yrs (08/2021).   Prediabetes 08/2019   08/20/19 HbA1c 6.4%->recommended metformin at that time.   Sleep apnea     Past Surgical History:  Procedure Laterality Date   CARDIOVASCULAR STRESS TEST  09/2012   Rest/Stress myoview: normal   CESAREAN SECTION  1988   CHOLECYSTECTOMY  2002   COLONOSCOPY     COLONOSCOPY W/ POLYPECTOMY  10/22/12; 09/03/16   2013; tubular adenoma, no high grade dysplasia (Dr. GCarlean Purl.  Repeat 08/2016: no polyps.  Recall 5 yrs (08/2021--Dr. GCarlean Purl.   CPAP  07/2020   CYSTOSCOPY     with bladder bx 07/22/18-->   EPIGASTRIC HERNIA REPAIR N/A 06/26/2019   Procedure: LAPAROSCOPIC EPIGASTRIC HERNIA REPAIR WITH MESH;  Surgeon: NAlphonsa Overall MD;  Location: [facility];  Service: General;  Laterality: N/A;   HERNIA REPAIR  2003  umbilical, during lap chole   POLYPECTOMY     TONSILLECTOMY AND ADENOIDECTOMY  9323   UMBILICAL HERNIA REPAIR  08/19/2012   Procedure: LAPAROSCOPIC UMBILICAL HERNIA;  Surgeon: Shann Medal, MD;  Location: [facility];  Service: General;  Laterality: N/A;    Outpatient Medications Prior to Visit  Medication Sig Dispense Refill   acetaminophen (TYLENOL) 500 MG tablet Take 1,000 mg by mouth every 6 (six) hours as needed for moderate pain.     Cinnamon 500 MG capsule Take 500 mg by mouth daily.     estradiol (ESTRACE) 2 MG tablet Take 1 tablet (2 mg total) by mouth daily. 90 tablet 3   famotidine (PEPCID) 20 MG tablet Take 20 mg by mouth as needed  for heartburn or indigestion.     Ibuprofen (MOTRIN PO) Take by mouth as needed.     Lactobacillus (ACIDOPHILUS) 100 MG CAPS Take by mouth in the morning and at bedtime.     Melatonin 10 MG CAPS Take by mouth.     mupirocin ointment (BACTROBAN) 2 % Apply 1 application topically 3 (three) times daily. 22 g 1   progesterone (PROMETRIUM) 200 MG capsule Take 1 capsule (200 mg total) by mouth daily. 90 capsule 3   amphetamine-dextroamphetamine (ADDERALL XR) 20 MG 24 hr capsule Take 1 capsule (20 mg total) by mouth daily. 30 capsule 0   fluticasone (FLOVENT HFA) 110 MCG/ACT inhaler INHALE 2 PUFFS INTO THE LUNGS DAILY. (Patient not taking: Reported on 01/25/2022) 12 g 12   buPROPion (WELLBUTRIN XL) 300 MG 24 hr tablet Take 1 tablet (300 mg total) by mouth daily. 90 tablet 0   FLUoxetine (PROZAC) 20 MG capsule TAKE 1 CAPSULE BY MOUTH ONCE A DAY 90 capsule 1   metFORMIN (GLUCOPHAGE) 500 MG tablet Take 1 tablet (500 mg total) by mouth every evening at suppertime (need appointment). 30 tablet 0   No facility-administered medications prior to visit.    Allergies  Allergen Reactions   Ciprofloxacin Itching    Review of Systems As per HPI  PE:    11/22/2022   10:34 AM 09/27/2022    1:23 PM 09/27/2022    1:13 PM  Vitals with BMI  Weight 557 lbs    Systolic [phone]  Diastolic 75 57 50  Pulse 61 68 67     Physical Exam  Gen: Alert, well appearing.  Patient is oriented to person, place, time, and situation. AFFECT: pleasant, lucid thought and speech. L forearm with 3-4 abrasions, w/out tenderness or fluctuance or surrounding erythema.  LABS:  Last CBC Lab Results  Component Value Date   WBC 5.4 04/30/2022   HGB 12.7 04/30/2022   HCT 38.9 04/30/2022   MCV 90.7 04/30/2022   MCH 28.3 06/23/2019   RDW 13.9 04/30/2022   PLT 225.0 05/05/7627   Last metabolic panel Lab Results  Component Value Date   GLUCOSE 91 04/30/2022   NA 139 04/30/2022   K 4.0 04/30/2022   CL 105  04/30/2022   CO2 28 04/30/2022   BUN 10 04/30/2022   CREATININE 0.76 04/30/2022   GFRNONAA >60 04/26/2019   CALCIUM 8.7 04/30/2022   PROT 6.9 04/30/2022   ALBUMIN 4.0 04/30/2022   BILITOT 0.5 04/30/2022   ALKPHOS 49 04/30/2022   AST 13 04/30/2022   ALT 13 04/30/2022   ANIONGAP 10 04/26/2019   Last lipids Lab Results  Component Value Date   CHOL 223 (H) 04/30/2022   HDL 55.70 04/30/2022   LDLCALC 102 (  H) 10/03/2020   LDLDIRECT 130.0 04/30/2022   TRIG 201.0 (H) 04/30/2022   CHOLHDL 4 04/30/2022   Last hemoglobin A1c Lab Results  Component Value Date   HGBA1C 5.3 11/22/2022   HGBA1C 5.3 11/22/2022   HGBA1C 5.3 (A) 11/22/2022   HGBA1C 5.3 11/22/2022   Last thyroid functions Lab Results  Component Value Date   TSH 2.93 04/30/2022   IMPRESSION AND PLAN:  #1 prediabetes.  Doing excellent on metformin 500 mg daily. POC Hba1c today is 5.3%. She prefers to stay on this med at this time.  2.  Recurrent major depressive disorder. Doing well on fluoxetine 20 mg a day and Wellbutrin XL 300 mg a day.  3.  Adult ADD, excessive daytime sleepiness.  This has responded well to Adderall XR but efficacy of response weaning and duration of response is not very good. Increase Adderall XR to 30 mg every morning and start immediate release/short acting Adderall 10 mg in the afternoon.  #4 left forearm cat scratch. Monitor for signs of infection. Boosted Td today.  An After Visit Summary was printed and given to the patient.  FOLLOW UP: Return in about 6 months (around 05/23/2023) for annual CPE (fasting).  Signed:  Crissie Sickles, MD           11/22/2022

## 2022-11-23 ENCOUNTER — Other Ambulatory Visit (HOSPITAL_BASED_OUTPATIENT_CLINIC_OR_DEPARTMENT_OTHER): Payer: Self-pay

## 2022-12-14 ENCOUNTER — Other Ambulatory Visit (HOSPITAL_BASED_OUTPATIENT_CLINIC_OR_DEPARTMENT_OTHER): Payer: Self-pay

## 2022-12-14 MED ORDER — COMIRNATY 30 MCG/0.3ML IM SUSY
PREFILLED_SYRINGE | INTRAMUSCULAR | 0 refills | Status: DC
  Filled 2022-12-14: qty 0.3, 1d supply, fill #0

## 2023-01-22 ENCOUNTER — Other Ambulatory Visit (HOSPITAL_BASED_OUTPATIENT_CLINIC_OR_DEPARTMENT_OTHER): Payer: Self-pay

## 2023-01-23 ENCOUNTER — Other Ambulatory Visit (HOSPITAL_COMMUNITY): Payer: Self-pay

## 2023-01-23 ENCOUNTER — Other Ambulatory Visit (HOSPITAL_BASED_OUTPATIENT_CLINIC_OR_DEPARTMENT_OTHER): Payer: Self-pay

## 2023-02-17 ENCOUNTER — Other Ambulatory Visit (HOSPITAL_BASED_OUTPATIENT_CLINIC_OR_DEPARTMENT_OTHER): Payer: Self-pay

## 2023-02-25 ENCOUNTER — Encounter: Payer: Self-pay | Admitting: *Deleted

## 2023-03-08 ENCOUNTER — Other Ambulatory Visit: Payer: Self-pay

## 2023-03-08 ENCOUNTER — Encounter: Payer: Self-pay | Admitting: Emergency Medicine

## 2023-03-08 ENCOUNTER — Ambulatory Visit
Admission: EM | Admit: 2023-03-08 | Discharge: 2023-03-08 | Disposition: A | Discharge status: Home / Self Care | Payer: Commercial Managed Care - PPO | Attending: Family Medicine | Admitting: Family Medicine

## 2023-03-08 ENCOUNTER — Ambulatory Visit (INDEPENDENT_AMBULATORY_CARE_PROVIDER_SITE_OTHER): Payer: Commercial Managed Care - PPO

## 2023-03-08 DIAGNOSIS — R059 Cough, unspecified: Secondary | ICD-10-CM | POA: Diagnosis not present

## 2023-03-08 DIAGNOSIS — J209 Acute bronchitis, unspecified: Secondary | ICD-10-CM | POA: Diagnosis not present

## 2023-03-08 MED ORDER — PREDNISONE 20 MG PO TABS: ORAL_TABLET | ORAL | 0 refills | Status: DC

## 2023-03-08 MED ORDER — DOXYCYCLINE HYCLATE 100 MG PO CAPS: ORAL_CAPSULE | ORAL | 0 refills | Status: DC

## 2023-03-08 MED ORDER — METHYLPREDNISOLONE SODIUM SUCC 125 MG IJ SOLR
80.0000 mg | Freq: Once | INTRAMUSCULAR | Status: AC
  Administered 2023-03-08: 80 mg via INTRAMUSCULAR

## 2023-03-08 NOTE — ED Triage Notes (Signed)
Patient presents to [facility] for evaluation of cough x 2 weeks, possible fever and other URI symptoms at the beginning.   Mom was around her and she had bronchitis.  Patient denies SOB except after coughing fits.

## 2023-03-08 NOTE — Discharge Instructions (Addendum)
Begin prednisone Saturday 03/09/23. Take plain guaifenesin (1200mg  extended release tabs such as Mucinex) twice daily, with plenty of water, for cough and congestion.  Get adequate rest.   May take Delsym Cough Suppressant ("12 Hour Cough Relief") at bedtime for nighttime cough.   If symptoms become significantly worse during the night or over the weekend, proceed to the local emergency room.

## 2023-03-08 NOTE — ED Provider Notes (Signed)
[facility]    CSN: [id number] Arrival date & time: 03/08/23  1623      History   Chief Complaint Chief Complaint  Patient presents with   Cough    HPI Donna Willis is a 62 y.o. female.   Patient reports that she had a URI two weeks ago that mostly resolved except for a persistent cough.  She denies fever at present and feels well otherwise, but has shortness of breath after prolonged cough.  She denies pleuritic pain.  She has a history of histoplasmosis infection.  The history is provided by the patient.    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 Earlene Plater: hasn't seen him in yrs or so, was on elmiron and coldn't tell much difference.   MDD (major depressive disorder)    Mild persistent asthma 08/31/2013   [place] pulm: stable as of pulm f/u 02/2018 and 05/2019: no med changes made---1 yr f/u recommended.   OSA on CPAP 07/2020   Personal history of  adenomatous colonic polyp 10/28/2012   10/2012 - 4x10 mm tubular adenoma; repeat 09/03/16 showed NO POLYPS.  Recall 5 yrs (08/2021).   Prediabetes 08/2019   08/20/19 HbA1c 6.4%->recommended metformin at that time.   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 08/18/2012    Past Surgical History:  Procedure Laterality Date   CARDIOVASCULAR STRESS TEST  09/2012   Rest/Stress myoview: normal   CESAREAN SECTION  1988   CHOLECYSTECTOMY  2002   COLONOSCOPY     COLONOSCOPY W/ POLYPECTOMY  10/22/12; 09/03/16   2013; tubular adenoma, no high grade dysplasia (Dr. Leone Payor).  Repeat 08/2016: no polyps.  Recall 5 yrs (08/2021--Dr. Leone Payor).   CPAP  07/2020   CYSTOSCOPY     with bladder bx 07/22/18-->   EPIGASTRIC HERNIA REPAIR N/A 06/26/2019   Procedure: LAPAROSCOPIC EPIGASTRIC HERNIA REPAIR WITH MESH;  Surgeon: Ovidio Kin, MD;  Location: [facility];  Service: General;  Laterality: N/A;   HERNIA REPAIR  2003   umbilical, during lap chole   POLYPECTOMY     TONSILLECTOMY AND ADENOIDECTOMY  1974   UMBILICAL HERNIA REPAIR  08/19/2012   Procedure: LAPAROSCOPIC UMBILICAL HERNIA;  Surgeon: Kandis Cocking, MD;  Location: [facility];  Service: General;  Laterality: N/A;    OB History   No obstetric history on file.  Home Medications    Prior to Admission medications   Medication Sig Start Date End Date Taking? Authorizing Provider  doxycycline (VIBRAMYCIN) 100 MG capsule Take one cap PO Q12hr with food. 03/08/23  Yes Lattie Haw, MD  predniSONE (DELTASONE) 20 MG tablet Take one tab by mouth twice daily for 3 days, then one daily for 2 days. Take with food. 03/08/23  Yes Lattie Haw, MD  acetaminophen (TYLENOL) 500 MG tablet Take 1,000 mg by mouth every 6 (six) hours as needed for moderate pain.    [provider]  amphetamine-dextroamphetamine (ADDERALL XR) 30 MG 24 hr capsule Take 1 capsule (30 mg total) by mouth daily. 11/22/22   McGowen, Maryjean Morn,  MD  amphetamine-dextroamphetamine (ADDERALL) 10 MG tablet Take 1 tablet (10 mg total) by mouth daily in the afternoon. 11/22/22   McGowen, Maryjean Morn, MD  buPROPion (WELLBUTRIN XL) 300 MG 24 hr tablet Take 1 tablet (300 mg total) by mouth daily. 11/22/22   McGowen, Maryjean Morn, MD  Cinnamon 500 MG capsule Take 500 mg by mouth daily.    [provider]  COVID-19 mRNA vaccine [phone] (COMIRNATY) syringe Inject into the muscle. 12/14/22   Judyann Munson, MD  estradiol (ESTRACE) 2 MG tablet Take 1 tablet (2 mg total) by mouth daily. 12/27/21     famotidine (PEPCID) 20 MG tablet Take 20 mg by mouth as needed for heartburn or indigestion.    [provider]  FLUoxetine (PROZAC) 20 MG capsule Take 1 capsule (20 mg total) by mouth daily. 11/22/22   McGowen, Maryjean Morn, MD  fluticasone (FLOVENT HFA) 110 MCG/ACT inhaler INHALE 2 PUFFS INTO THE LUNGS DAILY. Patient not taking: Reported on 01/25/2022 06/02/20 06/02/21  Parrett, Virgel Bouquet, NP  Ibuprofen (MOTRIN PO) Take by mouth as needed.    [provider]  Lactobacillus (ACIDOPHILUS) 100 MG CAPS Take by mouth in the morning and at bedtime.    [provider]  Melatonin 10 MG CAPS Take by mouth.    [provider]  metFORMIN (GLUCOPHAGE) 500 MG tablet Take 1 tablet (500 mg total) by mouth every evening at suppertime (need appointment). 11/22/22   McGowen, Maryjean Morn, MD  mupirocin ointment (BACTROBAN) 2 % Apply 1 application topically 3 (three) times daily. 08/16/21   McGowen, Maryjean Morn, MD  progesterone (PROMETRIUM) 200 MG capsule Take 1 capsule (200 mg total) by mouth daily. 12/27/21       Family History Family History  Problem Relation Age of Onset   Colon polyps Mother    Diverticulitis Father    Kidney failure Brother    Kidney disease Brother    Other Maternal Uncle        great-stom or colon cancer   Colon cancer Maternal Uncle    Rectal cancer Neg Hx    Esophageal cancer Neg Hx    Stomach cancer Neg Hx     Social  History Social History   Tobacco Use   Smoking status: Never   Smokeless tobacco: Never  Vaping Use   Vaping Use: Never used  Substance Use Topics   Alcohol use: Not Currently    Comment: once per year   Drug use: Never     Allergies   Ciprofloxacin   Review of Systems Review of Systems No sore throat + cough No pleuritic pain ? wheezing No nasal congestion No post-nasal drainage No sinus pain/pressure No itchy/red eyes No earache No hemoptysis + SOB after prolonged coughing No fever/chills No nausea  No vomiting No abdominal pain No diarrhea No urinary symptoms No skin rash No fatigue No myalgias No headache   Physical Exam Triage Vital Signs ED Triage Vitals  Enc Vitals Group     BP 03/08/23 1643 135/84     Pulse Rate 03/08/23 1643 82     Resp 03/08/23 1643 18     Temp 03/08/23 1643 98.3 F (36.8 C)     Temp Source 03/08/23 1643 Oral     SpO2 03/08/23 1643 98 %     Weight --      Height --      Head Circumference --      Peak Flow --      Pain Score 03/08/23 1644 5     Pain Loc --      Pain Edu? --      Excl. in GC? --    No data found.  Updated Vital Signs BP 135/84 (BP Location: Right Arm)   Pulse 82   Temp 98.3 F (36.8 C) (Oral)   Resp 18   LMP 05/30/2019 (Exact Date)   SpO2 98%   Visual Acuity Right Eye Distance:   Left Eye Distance:   Bilateral Distance:    Right Eye Near:   Left Eye Near:    Bilateral Near:     Physical Exam Nursing notes and Vital Signs reviewed. Appearance:  Patient appears stated age, and in no acute distress Eyes:  Pupils are equal, round, and reactive to light and accomodation.  Extraocular movement is intact.  Conjunctivae are not inflamed  Ears:  Canals normal.  Tympanic membranes normal.  Nose:  Normal turbinates.  No sinus tenderness.  Pharynx:  Normal Neck:  Supple. No adenopathy. Lungs: Few faint scattered wheezes bilaterally.  Breath sounds are equal.  Moving air well. Heart:  Regular  rate and rhythm without murmurs, rubs, or gallops.  Abdomen:  Nontender without masses or hepatosplenomegaly.  Bowel sounds are present.  No CVA or flank tenderness.  Extremities:  No edema.  Skin:  No rash present.   UC Treatments / Results  Labs (all labs ordered are listed, but only abnormal results are displayed) Labs Reviewed - No data to display  EKG   Radiology DG Chest 2 View  Result Date: 03/08/2023 CLINICAL DATA:  Persistent cough for 2 weeks. EXAM: CHEST - 2 VIEW COMPARISON:  Chest radiographs 06/29/2012 and 07/19/2007 FINDINGS: Cardiac silhouette and mediastinal contours within normal limits. Mild calcification within aortic arch. There are again calcified right and left hilar lymph nodes and more inferior left pericardial lymph nodes versus calcified benign lung granulomata, unchanged. A calcified granuloma again overlies the right midlung no acute airspace opacity. No pleural effusion or pneumothorax. Moderate multilevel degenerative disc changes of the thoracic spine. IMPRESSION: 1. No active cardiopulmonary disease. 2. Redemonstration of evidence of prior benign granulomatous disease. Electronically Signed   By: Neita Garnet M.D.   On: 03/08/2023 17:19    Procedures Procedures (including critical care time)  Medications Ordered in UC Medications  methylPREDNISolone sodium succinate (SOLU-MEDROL) 125 mg/2 mL injection 80 mg (80 mg Intramuscular Given 03/08/23 1744)    Initial Impression / Assessment and Plan / UC Course  I have reviewed the triage vital signs and the nursing notes.  Pertinent labs & imaging results that were available during my care of the patient were reviewed by me and considered in my medical decision making (see chart for details).    Administered Solumedrol 80mg  IM, then begin brief  prednisone taper. Begin doxycycline 100mg  Q12hr for 5 days. Followup with Family Doctor if not improved in one week.  Final Clinical Impressions(s) / UC Diagnoses    Final diagnoses:  Acute bronchitis, unspecified organism     Discharge Instructions      Begin prednisone Saturday 03/09/23. Take plain guaifenesin (1200mg  extended release tabs such as Mucinex) twice daily, with plenty of water, for cough and congestion.  Get adequate rest.   May take Delsym Cough Suppressant ("12 Hour Cough Relief") at bedtime for nighttime cough.   If symptoms become significantly worse during the night or over the weekend, proceed to the local emergency room.       ED Prescriptions     Medication Sig Dispense Auth. Provider   doxycycline (VIBRAMYCIN) 100 MG capsule Take one cap PO Q12hr with food. 10 capsule Lattie Haw, MD   predniSONE (DELTASONE) 20 MG tablet Take one tab by mouth twice daily for 3 days, then one daily for 2 days. Take with food. 8 tablet Lattie Haw, MD         Lattie Haw, MD 03/09/23 [phone]

## 2023-03-21 ENCOUNTER — Other Ambulatory Visit (HOSPITAL_BASED_OUTPATIENT_CLINIC_OR_DEPARTMENT_OTHER): Payer: Self-pay

## 2023-03-21 ENCOUNTER — Other Ambulatory Visit: Payer: Self-pay | Admitting: Family Medicine

## 2023-03-21 MED ORDER — AMPHETAMINE-DEXTROAMPHET ER 30 MG PO CP24
30.0000 mg | ORAL_CAPSULE | Freq: Every day | ORAL | 0 refills | Status: DC
  Filled 2023-03-21: qty 90, 90d supply, fill #0

## 2023-03-21 NOTE — Telephone Encounter (Signed)
Refill requested for Adderall. Next OV is 7/11

## 2023-04-02 ENCOUNTER — Encounter: Payer: Self-pay | Admitting: Neurology

## 2023-04-02 DIAGNOSIS — G4733 Obstructive sleep apnea (adult) (pediatric): Secondary | ICD-10-CM

## 2023-04-02 NOTE — Telephone Encounter (Signed)
PAP order signed.

## 2023-04-18 DIAGNOSIS — G4733 Obstructive sleep apnea (adult) (pediatric): Secondary | ICD-10-CM | POA: Diagnosis not present

## 2023-04-23 ENCOUNTER — Other Ambulatory Visit (HOSPITAL_BASED_OUTPATIENT_CLINIC_OR_DEPARTMENT_OTHER): Payer: Self-pay

## 2023-05-20 NOTE — Patient Instructions (Signed)

## 2023-05-21 ENCOUNTER — Other Ambulatory Visit (HOSPITAL_BASED_OUTPATIENT_CLINIC_OR_DEPARTMENT_OTHER): Payer: Self-pay

## 2023-05-21 MED ORDER — AMOXICILLIN 500 MG PO CAPS
500.0000 mg | ORAL_CAPSULE | Freq: Four times a day (QID) | ORAL | 0 refills | Status: DC
  Filled 2023-05-21: qty 28, 7d supply, fill #0

## 2023-05-21 MED ORDER — HYDROCODONE-ACETAMINOPHEN 5-325 MG PO TABS
1.0000 | ORAL_TABLET | Freq: Four times a day (QID) | ORAL | 0 refills | Status: DC | PRN
  Filled 2023-05-21: qty 20, 5d supply, fill #0

## 2023-05-23 ENCOUNTER — Other Ambulatory Visit (HOSPITAL_BASED_OUTPATIENT_CLINIC_OR_DEPARTMENT_OTHER): Payer: Self-pay

## 2023-05-23 ENCOUNTER — Ambulatory Visit (INDEPENDENT_AMBULATORY_CARE_PROVIDER_SITE_OTHER): Payer: Commercial Managed Care - PPO | Admitting: Family Medicine

## 2023-05-23 ENCOUNTER — Encounter: Payer: Self-pay | Admitting: Family Medicine

## 2023-05-23 VITALS — BP 118/83 | HR 69 | Ht 65.0 in | Wt 235.8 lb

## 2023-05-23 DIAGNOSIS — F411 Generalized anxiety disorder: Secondary | ICD-10-CM | POA: Diagnosis not present

## 2023-05-23 DIAGNOSIS — R7303 Prediabetes: Secondary | ICD-10-CM | POA: Diagnosis not present

## 2023-05-23 DIAGNOSIS — F988 Other specified behavioral and emotional disorders with onset usually occurring in childhood and adolescence: Secondary | ICD-10-CM

## 2023-05-23 DIAGNOSIS — Z13220 Encounter for screening for lipoid disorders: Secondary | ICD-10-CM | POA: Diagnosis not present

## 2023-05-23 DIAGNOSIS — Z0000 Encounter for general adult medical examination without abnormal findings: Secondary | ICD-10-CM

## 2023-05-23 DIAGNOSIS — F3342 Major depressive disorder, recurrent, in full remission: Secondary | ICD-10-CM | POA: Diagnosis not present

## 2023-05-23 LAB — CBC WITH DIFFERENTIAL/PLATELET
Basophils Absolute: 0.1 10*3/uL (ref 0.0–0.1)
Basophils Relative: 0.8 % (ref 0.0–3.0)
Eosinophils Absolute: 0.1 10*3/uL (ref 0.0–0.7)
Eosinophils Relative: 2 % (ref 0.0–5.0)
HCT: 43 % (ref 36.0–46.0)
Hemoglobin: 14 g/dL (ref 12.0–15.0)
Lymphocytes Relative: 28.2 % (ref 12.0–46.0)
Lymphs Abs: 1.9 10*3/uL (ref 0.7–4.0)
MCHC: 32.6 g/dL (ref 30.0–36.0)
MCV: 91.4 fl (ref 78.0–100.0)
Monocytes Absolute: 0.5 10*3/uL (ref 0.1–1.0)
Monocytes Relative: 8.1 % (ref 3.0–12.0)
Neutro Abs: 4 10*3/uL (ref 1.4–7.7)
Neutrophils Relative %: 60.9 % (ref 43.0–77.0)
Platelets: 277 10*3/uL (ref 150.0–400.0)
RBC: 4.7 Mil/uL (ref 3.87–5.11)
RDW: 13.7 % (ref 11.5–15.5)
WBC: 6.6 10*3/uL (ref 4.0–10.5)

## 2023-05-23 LAB — LIPID PANEL
Cholesterol: 254 mg/dL — ABNORMAL HIGH (ref 0–200)
HDL: 55.8 mg/dL (ref >39.00)
NonHDL: 198.13
Total CHOL/HDL Ratio: 5
Triglycerides: 213 mg/dL — ABNORMAL HIGH (ref 0.0–149.0)
VLDL: 42.6 mg/dL — ABNORMAL HIGH (ref 0.0–40.0)

## 2023-05-23 LAB — COMPREHENSIVE METABOLIC PANEL
ALT: 25 U/L (ref 0–35)
AST: 19 U/L (ref 0–37)
Albumin: 4.3 g/dL (ref 3.5–5.2)
Alkaline Phosphatase: 55 U/L (ref 39–117)
BUN: 18 mg/dL (ref 6–23)
CO2: 24 mEq/L (ref 19–32)
Calcium: 9.5 mg/dL (ref 8.4–10.5)
Chloride: 106 mEq/L (ref 96–112)
Creatinine, Ser: 0.8 mg/dL (ref 0.40–1.20)
GFR: 79.58 mL/min (ref >60.00)
Glucose, Bld: 92 mg/dL (ref 70–99)
Potassium: 4.3 mEq/L (ref 3.5–5.1)
Sodium: 140 mEq/L (ref 135–145)
Total Bilirubin: 0.5 mg/dL (ref 0.2–1.2)
Total Protein: 7.3 g/dL (ref 6.0–8.3)

## 2023-05-23 LAB — LDL CHOLESTEROL, DIRECT: Direct LDL: 155 mg/dL

## 2023-05-23 LAB — TSH: TSH: 4.69 u[IU]/mL (ref 0.35–5.50)

## 2023-05-23 LAB — HEMOGLOBIN A1C: Hgb A1c MFr Bld: 6 % (ref 4.6–6.5)

## 2023-05-23 MED ORDER — METFORMIN HCL 500 MG PO TABS
500.0000 mg | ORAL_TABLET | Freq: Every evening | ORAL | 3 refills | Status: DC
  Filled 2023-05-23: qty 90, 90d supply, fill #0
  Filled 2023-08-22 – 2023-08-23 (×2): qty 90, 90d supply, fill #1
  Filled 2023-11-21: qty 90, 90d supply, fill #2
  Filled 2024-02-23: qty 90, 90d supply, fill #3

## 2023-05-23 MED ORDER — FLUTICASONE PROPIONATE HFA 110 MCG/ACT IN AERO
2.0000 | INHALATION_SPRAY | Freq: Every day | RESPIRATORY_TRACT | 11 refills | Status: DC
  Filled 2023-05-23: qty 12, 30d supply, fill #0
  Filled 2023-05-23: qty 12, 60d supply, fill #0
  Filled 2023-11-21: qty 12, 30d supply, fill #1
  Filled 2024-02-04: qty 12, 30d supply, fill #2

## 2023-05-23 NOTE — Progress Notes (Signed)
Office Note 05/23/2023  CC:  Chief Complaint  Patient presents with   Annual Exam    Pt is fasting   Patient is a 62 y.o. female who is here for annual health maintenance exam and 50-month follow-up anxiety and depression, adult ADD, and prediabetes. A/P as of last visit: "#1 prediabetes.  Doing excellent on metformin 500 mg daily. POC Hba1c today is 5.3%. She prefers to stay on this med at this time.   2.  Recurrent major depressive disorder. Doing well on fluoxetine 20 mg a day and Wellbutrin XL 300 mg a day.   3.  Adult ADD, excessive daytime sleepiness.  This has responded well to Adderall XR but efficacy of response weaning and duration of response is not very good. Increase Adderall XR to 30 mg every morning and start immediate release/short acting Adderall 10 mg in the afternoon.   #4 left forearm cat scratch. Monitor for signs of infection. Boosted Td today."  INTERIM HX: Cam is doing well.  She is recovering from having 3 wisdom teeth pulled a few days ago.  She feels like adherence to good diet waxes and wanes.  Pt states all is going well with the med at current dosing (Adderall XR 30 mg a day, and additional Adderall 10 mg plain in the afternoon as needed): much improved focus, concentration, task completion.  Less frustration, better multitasking, less impulsivity and restlessness.  Mood is stable. No side effects from the medication.  Feels like she is getting good benefit from her Wellbutrin XL and her fluoxetine.  PMP AWARE reviewed today: most recent rx for Adderall XR 30 mg was filled 03/21/2023, # 90, rx by me. Most recent Adderall 10 mg prescription filled 11/22/2022, #90, prescription by me. No red flags.   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 Earlene Plater: hasn't seen him in yrs or so, was on elmiron and coldn't tell much difference.   MDD (major depressive disorder)    Mild persistent asthma 08/31/2013   [place] pulm: stable as of pulm f/u 02/2018 and 05/2019: no med changes made---1 yr f/u recommended.   OSA on CPAP 07/2020   Personal history of  adenomatous colonic polyp 10/28/2012   10/2012 - 4x10 mm tubular adenoma; repeat 09/03/16 showed NO POLYPS.  Recall 5 yrs (08/2021).   Prediabetes 08/2019   08/20/19 HbA1c 6.4%->recommended metformin at that time.   Sleep apnea     Past Surgical History:  Procedure Laterality Date   CARDIOVASCULAR STRESS TEST  09/2012   Rest/Stress myoview: normal   CESAREAN SECTION  1988   CHOLECYSTECTOMY  2002   COLONOSCOPY     09/27/22, one adenoma, +diverticulosis and redundant colon.  Recall 7 yrs   COLONOSCOPY W/ POLYPECTOMY  10/22/12; 09/03/16   2013; tubular adenoma, no high grade dysplasia (Dr. Leone Payor).  Repeat 08/2016: no polyps.  Recall 5 yrs (08/2021--Dr. Leone Payor).   CPAP  07/2020   CYSTOSCOPY  with bladder bx 07/22/18-->   EPIGASTRIC HERNIA REPAIR N/A 06/26/2019   Procedure: LAPAROSCOPIC EPIGASTRIC HERNIA REPAIR WITH MESH;  Surgeon: Ovidio Kin, MD;  Location: [facility];  Service: General;  Laterality: N/A;   HERNIA REPAIR  2003   umbilical, during lap chole   POLYPECTOMY     TONSILLECTOMY AND ADENOIDECTOMY  1974   UMBILICAL HERNIA REPAIR  08/19/2012   Procedure: LAPAROSCOPIC UMBILICAL HERNIA;  Surgeon: Kandis Cocking, MD;  Location: [facility];  Service: General;  Laterality: N/A;    Family History  Problem Relation Age of Onset   Colon polyps Mother    Diverticulitis Father    Kidney failure Brother    Kidney disease Brother    Other  Maternal Uncle        great-stom or colon cancer   Colon cancer Maternal Uncle    Rectal cancer Neg Hx    Esophageal cancer Neg Hx    Stomach cancer Neg Hx     Social History   Socioeconomic History   Marital status: Married    Spouse name: Not on file   Number of children: Not on file   Years of education: Not on file   Highest education level: Bachelor's degree (e.g., BA, AB, BS)  Occupational History   Not on file  Tobacco Use   Smoking status: Never   Smokeless tobacco: Never  Vaping Use   Vaping status: Never Used  Substance and Sexual Activity   Alcohol use: Not Currently    Comment: once per year   Drug use: Never   Sexual activity: Not Currently    Birth control/protection: Post-menopausal  Other Topics Concern   Not on file  Social History Narrative   Widow 2023,   Has 1 son.   Orig from [place] but has lived in [place] >30 yrs.   Occupation: Charity fundraiser in Engineer, agricultural for [employer]: reading, crafts, bowling.Exercise: walks 40 min 3X/week.  No T/A/Ds   Social Determinants of Health   Financial Resource Strain: Low Risk  (11/22/2022)   Overall Financial Resource Strain (CARDIA)    Difficulty of Paying Living Expenses: Not hard at all  Food Insecurity: No Food Insecurity (11/22/2022)   Hunger Vital Sign    Worried About Running Out of Food in the Last Year: Never true    Ran Out of Food in the Last Year: Never true  Transportation Needs: No Transportation Needs (11/22/2022)   PRAPARE - Administrator, Civil Service (Medical): No    Lack of Transportation (Non-Medical): No  Physical Activity: Sufficiently Active (11/22/2022)   Exercise Vital Sign    Days of Exercise per Week: 4 days    Minutes of Exercise per Session: 40 min  Stress: Stress Concern Present (11/22/2022)   [employer] of Occupational Health - Occupational Stress Questionnaire    Feeling of Stress : To some extent  Social Connections: Moderately Integrated (11/22/2022)   Social  Connection and Isolation Panel [NHANES]    Frequency of Communication with Friends and Family: More than three times a week    Frequency of Social Gatherings with Friends and Family: Once a week    Attends Religious Services: More than 4 times per year    Active Member of [employer] or Organizations: Yes    Attends Banker Meetings: More than 4 times per year    Marital Status: Widowed  Intimate Partner Violence: Not on file    Outpatient Medications Prior to Visit  Medication Sig  Dispense Refill   acetaminophen (TYLENOL) 500 MG tablet Take 1,000 mg by mouth every 6 (six) hours as needed for moderate pain.     amoxicillin (AMOXIL) 500 MG capsule Take 1 capsule (500 mg total) by mouth 4 (four) times daily until gone. 28 capsule 0   amphetamine-dextroamphetamine (ADDERALL XR) 30 MG 24 hr capsule Take 1 capsule (30 mg total) by mouth daily. 90 capsule 0   amphetamine-dextroamphetamine (ADDERALL) 10 MG tablet Take 1 tablet (10 mg total) by mouth daily in the afternoon. 90 tablet 0   buPROPion (WELLBUTRIN XL) 300 MG 24 hr tablet Take 1 tablet (300 mg total) by mouth daily. 90 tablet 1   Cinnamon 500 MG capsule Take 500 mg by mouth daily.     COVID-19 mRNA vaccine 2023-2024 (COMIRNATY) syringe Inject into the muscle. 0.3 mL 0   famotidine (PEPCID) 20 MG tablet Take 20 mg by mouth as needed for heartburn or indigestion.     FLUoxetine (PROZAC) 20 MG capsule Take 1 capsule (20 mg total) by mouth daily. 90 capsule 1   Ibuprofen (MOTRIN PO) Take by mouth as needed.     Lactobacillus (ACIDOPHILUS) 100 MG CAPS Take by mouth in the morning and at bedtime.     Melatonin 10 MG CAPS Take by mouth.     mupirocin ointment (BACTROBAN) 2 % Apply 1 application topically 3 (three) times daily. 22 g 1   metFORMIN (GLUCOPHAGE) 500 MG tablet Take 1 tablet (500 mg total) by mouth every evening at suppertime (need appointment). 90 tablet 1   doxycycline (VIBRAMYCIN) 100 MG capsule Take one cap PO Q12hr with  food. 10 capsule 0   estradiol (ESTRACE) 2 MG tablet Take 1 tablet (2 mg total) by mouth daily. 90 tablet 3   fluticasone (FLOVENT HFA) 110 MCG/ACT inhaler INHALE 2 PUFFS INTO THE LUNGS DAILY. (Patient not taking: Reported on 01/25/2022) 12 g 12   HYDROcodone-acetaminophen (NORCO/VICODIN) 5-325 MG tablet Take 1 tablet by mouth every 6 (six) hours as needed for pain. (Patient not taking: Reported on 05/23/2023) 20 tablet 0   predniSONE (DELTASONE) 20 MG tablet Take one tab by mouth twice daily for 3 days, then one daily for 2 days. Take with food. 8 tablet 0   progesterone (PROMETRIUM) 200 MG capsule Take 1 capsule (200 mg total) by mouth daily. 90 capsule 3   No facility-administered medications prior to visit.    Allergies  Allergen Reactions   Ciprofloxacin Itching    Review of Systems  Constitutional:  Negative for appetite change, chills, fatigue and fever.  HENT:  Negative for congestion, dental problem, ear pain and sore throat.   Eyes:  Negative for discharge, redness and visual disturbance.  Respiratory:  Negative for cough, chest tightness, shortness of breath and wheezing.   Cardiovascular:  Negative for chest pain, palpitations and leg swelling.  Gastrointestinal:  Negative for abdominal pain, blood in stool, diarrhea, nausea and vomiting.  Genitourinary:  Negative for difficulty urinating, dysuria, flank pain, frequency, hematuria and urgency.  Musculoskeletal:  Negative for arthralgias, back pain, joint swelling, myalgias and neck stiffness.  Skin:  Negative for pallor and rash.  Neurological:  Negative for dizziness, speech difficulty, weakness and headaches.  Hematological:  Negative for adenopathy. Does not bruise/bleed easily.  Psychiatric/Behavioral:  Negative for confusion and sleep disturbance. The patient is not nervous/anxious.     PE;    05/23/2023    8:53 AM 03/08/2023    4:43 PM 11/22/2022   10:34 AM  Vitals with BMI  Height 5\' 5"     Weight 235 lbs 13 oz  231  lbs  BMI 39.24    Systolic 118 135 161  Diastolic 83 84 75  Pulse 69 82 61   Exam chaperoned by [employer], CMA Gen: Alert, well appearing.  Patient is oriented to person, place, time, and situation. AFFECT: pleasant, lucid thought and speech. ENT: Ears: EACs clear, normal epithelium.  TMs with good light reflex and landmarks bilaterally.  Eyes: no injection, icteris, swelling, or exudate.  EOMI, PERRLA. Nose: no drainage or turbinate edema/swelling.  No injection or focal lesion.  Mouth: lips without lesion/swelling.  Oral mucosa pink and moist.  Dentition intact and without obvious caries or gingival swelling.  Oropharynx without erythema, exudate, or swelling.  Neck: supple/nontender.  No LAD, mass, or TM.  Carotid pulses 2+ bilaterally, without bruits. CV: RRR, no m/r/g.   LUNGS: CTA bilat, nonlabored resps, good aeration in all lung fields. ABD: soft, NT, ND, BS normal.  No hepatospenomegaly or mass.  No bruits. EXT: no clubbing, cyanosis, or edema.  Musculoskeletal: no joint swelling, erythema, warmth, or tenderness.  ROM of all joints intact. Skin - no sores or suspicious lesions or rashes or color changes  Pertinent labs:  Lab Results  Component Value Date   TSH 2.93 04/30/2022   Lab Results  Component Value Date   WBC 5.4 04/30/2022   HGB 12.7 04/30/2022   HCT 38.9 04/30/2022   MCV 90.7 04/30/2022   PLT 225.0 04/30/2022   Lab Results  Component Value Date   CREATININE 0.76 04/30/2022   BUN 10 04/30/2022   NA 139 04/30/2022   K 4.0 04/30/2022   CL 105 04/30/2022   CO2 28 04/30/2022   Lab Results  Component Value Date   ALT 13 04/30/2022   AST 13 04/30/2022   ALKPHOS 49 04/30/2022   BILITOT 0.5 04/30/2022   Lab Results  Component Value Date   CHOL 223 (H) 04/30/2022   Lab Results  Component Value Date   HDL 55.70 04/30/2022   Lab Results  Component Value Date   LDLCALC 102 (H) 10/03/2020   Lab Results  Component Value Date   TRIG 201.0 (H)  04/30/2022   Lab Results  Component Value Date   CHOLHDL 4 04/30/2022   Lab Results  Component Value Date   HGBA1C 5.3 11/22/2022   HGBA1C 5.3 11/22/2022   HGBA1C 5.3 (A) 11/22/2022   HGBA1C 5.3 11/22/2022   ASSESSMENT AND PLAN:   #1 health maintenance exam: Reviewed age and gender appropriate health maintenance issues (prudent diet, regular exercise, health risks of tobacco and excessive alcohol, use of seatbelts, fire alarms in home, use of sunscreen).  Also reviewed age and gender appropriate health screening as well as vaccine recommendations. Vaccines: All up-to-date Labs: Fasting health panel ordered. Cervical ca screening: Per GYN MD--she is in the process of switching to a new GYN provider. Breast ca screening:  She has mammogram scheduled. Colon ca screening: Recall 2030.  #2 adult ADD, doing well on Adderall ER 30 mg daily and an occasional as needed dose of Adderall 10 mg in the afternoon.  #3 recurrent major depressive disorder, GAD. She is stable on Prozac 20 mg a day and Wellbutrin XL 300 mg a day. She did an online talk therapy with a provider once.  She is considering doing something similar again soon.  #4 prediabetes. Adherence to diet waxes and wanes.  Weight waxes and wanes. Hemoglobin A1c  has improved nicely since getting on metformin 500 mg a day. Will check hemoglobin A1c today.  An After Visit Summary was printed and given to the patient.  FOLLOW UP:  Return in about 6 months (around 11/23/2023) for routine chronic illness f/u.  Signed:  Santiago Bumpers, MD           05/23/2023

## 2023-05-24 ENCOUNTER — Encounter: Payer: Self-pay | Admitting: Family Medicine

## 2023-06-10 ENCOUNTER — Telehealth: Payer: Self-pay

## 2023-06-10 NOTE — Patient Instructions (Signed)

## 2023-06-10 NOTE — Progress Notes (Unsigned)
PATIENT: Donna Willis DOB: 20-Mar-1961  REASON FOR VISIT: follow up HISTORY FROM: patient  Virtual Visit via Telephone Note  I connected with Donna Willis on 06/11/23 at  9:45 AM EDT by telephone and verified that I am speaking with the correct person using two identifiers.   I discussed the limitations, risks, security and privacy concerns of performing an evaluation and management service by telephone and the availability of in person appointments. I also discussed with the patient that there may be a patient responsible charge related to this service. The patient expressed understanding and agreed to proceed.   History of Present Illness:  06/11/23 ALL (Mychart): Donna Willis is a 62 y.o. female here today for follow up for OSA on CPAP. She was last seen by Dr Frances Furbish 06/2022 and doing well. She reports continuing to do well until the past month. She has been out of town more frequently caring for her family and has had some illnesses that limited use. She tested positive for Covid yesterday. She does note significant improvement in sleep quality when using CPAP. She denies concerns with machine or supplies.     History (copied from Dr Teofilo Pod previous note)  Donna Willis is a 62 year old right-handed woman with an underlying medical history of interstitial cystitis, prediabetes, reflux disease, depression, asthma, allergic rhinitis, histoplasmosis, and obesity, who presents for follow-up consultation of her obstructive sleep apnea, on CPAP therapy.  The patient is unaccompanied today. I last saw her on 04/18/21, at which time she reported doing well.  She did have increase in stress and some increase in daytime somnolence, attributed this to an increase in her antidepressant dose.   From the sleep apnea treatment standpoint she was doing well and advised to follow-up in 1 year.     Today, 06/19/2022: I reviewed CPAP compliance data from 05/18/2022 through 06/16/2022, which is a total of 30  days, during which time she used her machine 28 days with percent use days greater than 4 hours at 87%, indicating very good compliance with an average usage of 7 hours and 3 minutes, residual AHI at goal at 0.3/h, leak acceptable with the 95th percentile at 14.1 L/min on a pressure of 10 cm with EPR of 3.  She reports doing well with her machine, she is compliant with treatment and continues to benefit from it.  She is typically up-to-date with her supplies.  She uses a hybrid style fullface mask and her DME's choice home.  She had some changes in her medication regimen within the past year, Prozac is down to 20 mg daily, Wellbutrin 300 mg daily and she has been on Adderall 20 mg once daily long-acting per PCP.  Her Epworth sleepiness score is 7 out of 24.   Observations/Objective:  Generalized: Well developed, in no acute distress  Mentation: Alert oriented to time, place, history taking. Follows all commands speech and language fluent   Assessment and Plan:  62 y.o. year old female  has a past medical history of Abdominal wall hernia, Allergic rhinitis, Allergy, Atypical chest pain (08/2012), Borderline hyperlipidemia (05/2018; 08/2019), Contact dermatitis, Daytime sleepiness, Epigastric hernia (04/2019), GERD (gastroesophageal reflux disease), Hay fever, Hernia, History of histoplasmosis (08/31/2013), Hyperlipidemia, mixed, Interstitial cystitis, MDD (major depressive disorder), Mild persistent asthma (08/31/2013), OSA on CPAP (07/2020), Personal history of  adenomatous colonic polyp (10/28/2012), Prediabetes (08/2019), and Sleep apnea. here with     ICD-10-CM   1. OSA on CPAP  G47.33 For home use only DME  continuous positive airway pressure (CPAP)      [name] is doing well on CPAP therapy. Most recent 30 day compliance report shows sub optimal compliance, however, previous reports show excellent compliance. She was encouraged to continue CPAP nightly for at least 4 hours. We will update supply  orders. I will recheck compliance in 6-8 weeks. She will follow up with me in 1 year, sooner if needed.    Orders Placed This Encounter  Procedures   For home use only DME continuous positive airway pressure (CPAP)    Supplies    Order Specific Question:   Length of Need    Answer:   [employer]    Order Specific Question:   Patient has OSA or probable OSA    Answer:   Yes    Order Specific Question:   Is the patient currently using CPAP in the home    Answer:   Yes    Order Specific Question:   Settings    Answer:   Other see comments    Order Specific Question:   CPAP supplies needed    Answer:   Mask, headgear, cushions, filters, heated tubing and water chamber    No orders of the defined types were placed in this encounter.    Follow Up Instructions:  I discussed the assessment and treatment plan with the patient. The patient was provided an opportunity to ask questions and all were answered. The patient agreed with the plan and demonstrated an understanding of the instructions.   The patient was advised to call back or seek an in-person evaluation if the symptoms worsen or if the condition fails to improve as anticipated.  I provided 15 minutes of non-face-to-face time during this encounter. Patient located at their place of residence during Mychart visit. Provider is in the office.    Shawnie Dapper, NP

## 2023-06-10 NOTE — Telephone Encounter (Signed)
Please look at message from today.

## 2023-06-11 ENCOUNTER — Other Ambulatory Visit (HOSPITAL_BASED_OUTPATIENT_CLINIC_OR_DEPARTMENT_OTHER): Payer: Self-pay

## 2023-06-11 ENCOUNTER — Encounter: Payer: Self-pay | Admitting: Family Medicine

## 2023-06-11 ENCOUNTER — Telehealth: Payer: Commercial Managed Care - PPO | Admitting: Family Medicine

## 2023-06-11 DIAGNOSIS — U071 COVID-19: Secondary | ICD-10-CM | POA: Diagnosis not present

## 2023-06-11 DIAGNOSIS — J988 Other specified respiratory disorders: Secondary | ICD-10-CM | POA: Diagnosis not present

## 2023-06-11 DIAGNOSIS — G4733 Obstructive sleep apnea (adult) (pediatric): Secondary | ICD-10-CM | POA: Diagnosis not present

## 2023-06-11 MED ORDER — NIRMATRELVIR/RITONAVIR (PAXLOVID)TABLET
3.0000 | ORAL_TABLET | Freq: Two times a day (BID) | ORAL | 0 refills | Status: AC
  Filled 2023-06-11: qty 30, 5d supply, fill #0

## 2023-06-11 NOTE — Progress Notes (Signed)
Virtual Visit via Video Note  I connected with Donna Willis  on 06/11/23 at 11:20 AM EDT by a video enabled telemedicine application and verified that I am speaking with the correct person using two identifiers.  Location patient: [place] Location provider:work or home office Persons participating in the virtual visit: patient, provider  I discussed the limitations and requested verbal permission for telemedicine visit. The patient expressed understanding and agreed to proceed.  CC: 62 year old female being seen today for runny nose, home COVID test positive.  HPI: Started getting nasal congestion yesterday morning, some headache, slight cough.  Slight fatigue. No fever, no shortness of breath, no wheezing. No significant body aches and no chest pain. She has known COVID exposure recently so she tested at home and it was positive today.  ROS: See pertinent positives and negatives per HPI.  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 Earlene Plater: hasn't seen him in yrs or so, was on elmiron and coldn't tell much difference.   MDD (major depressive disorder)    Mild persistent asthma 08/31/2013    pulm: stable as of pulm f/u 02/2018 and 05/2019: no med changes made---1 yr f/u recommended.   OSA on CPAP 07/2020   Personal history of  adenomatous colonic polyp 10/28/2012   10/2012 - 4x10 mm tubular adenoma; repeat 09/03/16 showed NO POLYPS.  Recall 5 yrs (08/2021).   Prediabetes 08/2019   08/20/19 HbA1c  6.4%->recommended metformin at that time.   Sleep apnea     Past Surgical History:  Procedure Laterality Date   CARDIOVASCULAR STRESS TEST  09/2012   Rest/Stress myoview: normal   CESAREAN SECTION  1988   CHOLECYSTECTOMY  2002   COLONOSCOPY     09/27/22, one adenoma, +diverticulosis and redundant colon.  Recall 7 yrs   COLONOSCOPY W/ POLYPECTOMY  10/22/12; 09/03/16   2013; tubular adenoma, no high grade dysplasia (Dr. Leone Payor).  Repeat 08/2016: no polyps.  Recall 5 yrs (08/2021--Dr. Leone Payor).   CPAP  07/2020   CYSTOSCOPY     with bladder bx 07/22/18-->   EPIGASTRIC HERNIA REPAIR N/A 06/26/2019   Procedure: LAPAROSCOPIC EPIGASTRIC HERNIA REPAIR WITH MESH;  Surgeon: Ovidio Kin, MD;  Location: [facility];  Service: General;  Laterality: N/A;   HERNIA REPAIR  2003   umbilical, during lap chole   POLYPECTOMY     TONSILLECTOMY AND ADENOIDECTOMY  1974   UMBILICAL HERNIA REPAIR  08/19/2012   Procedure: LAPAROSCOPIC UMBILICAL HERNIA;  Surgeon: Kandis Cocking, MD;  Location: [facility];  Service: General;  Laterality: N/A;     Current Outpatient Medications:    acetaminophen (TYLENOL) 500 MG tablet, Take 1,000 mg by mouth every 6 (six) hours as needed for moderate pain., Disp: , Rfl:    amphetamine-dextroamphetamine (ADDERALL XR) 30 MG 24 hr capsule, Take 1 capsule (30 mg total) by mouth daily., Disp: 90 capsule, Rfl: 0   amphetamine-dextroamphetamine (ADDERALL) 10 MG  tablet, Take 1 tablet (10 mg total) by mouth daily in the afternoon., Disp: 90 tablet, Rfl: 0   buPROPion (WELLBUTRIN XL) 300 MG 24 hr tablet, Take 1 tablet (300 mg total) by mouth daily., Disp: 90 tablet, Rfl: 1   Cinnamon 500 MG capsule, Take 500 mg by mouth daily., Disp: , Rfl:    COVID-19 mRNA vaccine 2023-2024 (COMIRNATY) syringe, Inject into the muscle., Disp: 0.3 mL, Rfl: 0   famotidine (PEPCID) 20 MG tablet, Take 20 mg by mouth as needed for heartburn or indigestion., Disp: , Rfl:    FLUoxetine (PROZAC) 20 MG capsule, Take 1  capsule (20 mg total) by mouth daily., Disp: 90 capsule, Rfl: 1   fluticasone (FLOVENT HFA) 110 MCG/ACT inhaler, INHALE 2 PUFFS INTO THE LUNGS DAILY., Disp: 12 g, Rfl: 11   Ibuprofen (MOTRIN PO), Take by mouth as needed., Disp: , Rfl:    Lactobacillus (ACIDOPHILUS) 100 MG CAPS, Take by mouth in the morning and at bedtime., Disp: , Rfl:    Melatonin 10 MG CAPS, Take by mouth., Disp: , Rfl:    metFORMIN (GLUCOPHAGE) 500 MG tablet, Take 1 tablet (500 mg total) by mouth every evening., Disp: 90 tablet, Rfl: 3   mupirocin ointment (BACTROBAN) 2 %, Apply 1 application topically 3 (three) times daily., Disp: 22 g, Rfl: 1   nirmatrelvir/ritonavir (PAXLOVID) 20 x 150 MG & 10 x 100MG  TABS, Take 3 tablets by mouth 2 (two) times daily for 5 days. Take nirmatrelvir (150 mg) two tablets twice daily for 5 days and ritonavir (100 mg) one tablet twice daily for 5 days., Disp: 30 tablet, Rfl: 0   amoxicillin (AMOXIL) 500 MG capsule, Take 1 capsule (500 mg total) by mouth 4 (four) times daily until gone., Disp: 28 capsule, Rfl: 0  EXAM:  VITALS per patient if applicable:      05/23/2023    8:53 AM 03/08/2023    4:43 PM 11/22/2022   10:34 AM  Vitals with BMI  Height 5\' 5"     Weight 235 lbs 13 oz  231 lbs  BMI 39.24    Systolic 118 135 161  Diastolic 83 84 75  Pulse 69 82 61     GENERAL: alert, oriented, appears well and in no acute distress  HEENT: atraumatic, conjunttiva clear, no obvious abnormalities on inspection of external nose and ears  NECK: normal movements of the head and neck  LUNGS: on inspection no signs of respiratory distress, breathing rate appears normal, no obvious gross SOB, gasping or wheezing  CV: no obvious cyanosis  MS: moves all visible extremities without noticeable abnormality  PSYCH/NEURO: pleasant and cooperative, no obvious depression or anxiety, speech and thought processing grossly intact  LABS: none today  ASSESSMENT AND PLAN:  Discussed the following assessment  and plan:  COVID respiratory infection, mild to moderate severity. Patient's risk factors for complication due to COVID are: Mild persistent asthma and BMI 39.  GFR 80. Paxlovid eRx'd today. OTC symptomatic care discussed.  I discussed the assessment and treatment plan with the patient. The patient was provided an opportunity to ask questions and all were answered. The patient agreed with the plan and demonstrated an understanding of the instructions.   F/u:  if not signif imp in 3-4d  Signed:  Santiago Bumpers, MD           06/11/2023

## 2023-06-13 DIAGNOSIS — G4733 Obstructive sleep apnea (adult) (pediatric): Secondary | ICD-10-CM | POA: Diagnosis not present

## 2023-06-25 ENCOUNTER — Other Ambulatory Visit: Payer: Self-pay | Admitting: Family Medicine

## 2023-06-25 ENCOUNTER — Telehealth: Payer: 59 | Admitting: Family Medicine

## 2023-06-25 ENCOUNTER — Other Ambulatory Visit (HOSPITAL_BASED_OUTPATIENT_CLINIC_OR_DEPARTMENT_OTHER): Payer: Self-pay

## 2023-06-25 MED ORDER — BUPROPION HCL ER (XL) 300 MG PO TB24
300.0000 mg | ORAL_TABLET | Freq: Every day | ORAL | 3 refills | Status: DC
  Filled 2023-06-25: qty 90, 90d supply, fill #0
  Filled 2023-09-28 (×2): qty 90, 90d supply, fill #1
  Filled 2024-01-04: qty 90, 90d supply, fill #2
  Filled 2024-04-02: qty 90, 90d supply, fill #3

## 2023-06-25 MED ORDER — AMPHETAMINE-DEXTROAMPHET ER 30 MG PO CP24
30.0000 mg | ORAL_CAPSULE | Freq: Every day | ORAL | 0 refills | Status: DC
  Filled 2023-06-25: qty 90, 90d supply, fill #0

## 2023-06-28 ENCOUNTER — Other Ambulatory Visit: Payer: Self-pay

## 2023-06-28 ENCOUNTER — Encounter: Payer: Self-pay | Admitting: Radiology

## 2023-06-28 ENCOUNTER — Other Ambulatory Visit (HOSPITAL_BASED_OUTPATIENT_CLINIC_OR_DEPARTMENT_OTHER): Payer: Self-pay

## 2023-06-28 ENCOUNTER — Ambulatory Visit (INDEPENDENT_AMBULATORY_CARE_PROVIDER_SITE_OTHER): Payer: Commercial Managed Care - PPO | Admitting: Radiology

## 2023-06-28 ENCOUNTER — Other Ambulatory Visit: Payer: Self-pay | Admitting: Radiology

## 2023-06-28 VITALS — BP 124/72 | Ht 63.75 in | Wt 242.0 lb

## 2023-06-28 DIAGNOSIS — Z01419 Encounter for gynecological examination (general) (routine) without abnormal findings: Secondary | ICD-10-CM

## 2023-06-28 DIAGNOSIS — Z13820 Encounter for screening for osteoporosis: Secondary | ICD-10-CM

## 2023-06-28 DIAGNOSIS — Z79890 Hormone replacement therapy (postmenopausal): Secondary | ICD-10-CM

## 2023-06-28 DIAGNOSIS — Z1231 Encounter for screening mammogram for malignant neoplasm of breast: Secondary | ICD-10-CM

## 2023-06-28 MED ORDER — ESTRADIOL 0.075 MG/24HR TD PTTW
1.0000 | MEDICATED_PATCH | TRANSDERMAL | 4 refills | Status: DC
Start: 2023-07-01 — End: 2024-06-30
  Filled 2023-06-28: qty 24, 84d supply, fill #0
  Filled 2023-09-28 (×2): qty 24, 84d supply, fill #1
  Filled 2024-01-04: qty 24, 84d supply, fill #2
  Filled 2024-04-02: qty 24, 84d supply, fill #3

## 2023-06-28 MED ORDER — PROGESTERONE MICRONIZED 100 MG PO CAPS
100.0000 mg | ORAL_CAPSULE | Freq: Every day | ORAL | 4 refills | Status: DC
Start: 2023-06-28 — End: 2024-06-30
  Filled 2023-06-28: qty 90, 90d supply, fill #0
  Filled 2023-09-28 (×2): qty 90, 90d supply, fill #1
  Filled 2024-01-11: qty 90, 90d supply, fill #2
  Filled 2024-04-13: qty 90, 90d supply, fill #3

## 2023-06-28 NOTE — Progress Notes (Signed)
   Donna Willis 10/16/1961 [id number]   History: Postmenopausal 62 y.o. presents for annual exam. Transfer from [facility], Dr Jennette Kettle. Darrold Span out of her HRT after he retired, has hot flashes and trouble sleeping.Otherwise she is doing well.   Gynecologic History Postmenopausal Last Pap: 2023. Results were: normal Last mammogram: 2023. Results were: normal Last colonoscopy: 11/23 DEXA:>5years HRT use: previous, would like to restart  Obstetric History OB History  Gravida Para Term Preterm AB Living  1 1       1   SAB IAB Ectopic Multiple Live Births          1    # Outcome Date GA Lbr Len/2nd Weight Sex Type Anes PTL Lv  1 Para              The following portions of the patient's history were reviewed and updated as appropriate: allergies, current medications, past family history, past medical history, past social history, past surgical history, and problem list.  Review of Systems Pertinent items noted in HPI and remainder of comprehensive ROS otherwise negative.  Past medical history, past surgical history, family history and social history were all reviewed and documented in the EPIC chart.  Exam:  Vitals:   06/28/23 0842  BP: 124/72  Weight: 242 lb (109.8 kg)  Height: 5' 3.75" (1.619 m)   Body mass index is 41.87 kg/m.  General appearance:  Normal, obese Thyroid:  Symmetrical, normal in size, without palpable masses or nodularity. Respiratory  Auscultation:  Clear without wheezing or rhonchi Cardiovascular  Auscultation:  Regular rate, without rubs, murmurs or gallops  Edema/varicosities:  Not grossly evident Abdominal  Soft,nontender, without masses, guarding or rebound.  Liver/spleen:  No organomegaly noted  Hernia:  None appreciated  Skin  Inspection:  Grossly normal Breasts: Examined lying and sitting.   Right: Without masses, retractions, nipple discharge or axillary adenopathy.   Left: Without masses, retractions, nipple discharge or axillary  adenopathy. Genitourinary   Inguinal/mons:  Normal without inguinal adenopathy  External genitalia:  Normal appearing vulva with no masses, tenderness, or lesions  BUS/Urethra/Skene's glands:  Normal  Vagina:  Normal appearing with normal color and discharge, no lesions. Atrophy: mild   Cervix:  Normal appearing without discharge or lesions  Uterus:  Normal in size, shape and contour.  Midline and mobile, nontender  Adnexa/parametria:     Rt: Normal in size, without masses or tenderness.   Lt: Normal in size, without masses or tenderness.  Anus and perineum: Normal    Raynelle Fanning, CMA present for exam  Assessment/Plan:   1. Well female exam with routine gynecological exam Pap up to date per patient, will obtain records Schedule mammogram  2. Screening for osteoporosis - DG Bone Density; Future  3. Hormone replacement therapy (HRT) - estradiol (VIVELLE-DOT) 0.075 MG/24HR; Place 1 patch onto the skin 2 (two) times a week.  Dispense: 24 patch; Refill: 4 - progesterone (PROMETRIUM) 100 MG capsule; Take 1 capsule (100 mg total) by mouth daily.  Dispense: 90 capsule; Refill: 4    Discussed SBE, colonoscopy and DEXA screening as directed. Recommend of exercise weekly, including weight bearing exercise. Encouraged the use of seatbelts and sunscreen.  Return in 1 year for annual or sooner prn.  Arlie Solomons B WHNP-BC, 8:59 AM 06/28/2023

## 2023-06-29 ENCOUNTER — Other Ambulatory Visit (HOSPITAL_BASED_OUTPATIENT_CLINIC_OR_DEPARTMENT_OTHER): Payer: Self-pay

## 2023-07-05 ENCOUNTER — Ambulatory Visit
Admission: RE | Admit: 2023-07-05 | Discharge: 2023-07-05 | Disposition: A | Discharge status: Home / Self Care | Payer: Commercial Managed Care - PPO | Source: Ambulatory Visit | Attending: Radiology | Admitting: Radiology

## 2023-07-05 DIAGNOSIS — Z1231 Encounter for screening mammogram for malignant neoplasm of breast: Secondary | ICD-10-CM | POA: Diagnosis not present

## 2023-07-25 ENCOUNTER — Other Ambulatory Visit (HOSPITAL_COMMUNITY): Payer: Self-pay

## 2023-07-25 ENCOUNTER — Other Ambulatory Visit: Payer: Self-pay | Admitting: Family Medicine

## 2023-07-25 ENCOUNTER — Encounter: Payer: Self-pay | Admitting: Pharmacist

## 2023-07-25 ENCOUNTER — Other Ambulatory Visit: Payer: Self-pay

## 2023-07-25 MED ORDER — FLUOXETINE HCL 20 MG PO CAPS
20.0000 mg | ORAL_CAPSULE | Freq: Every day | ORAL | 1 refills | Status: DC
  Filled 2023-07-25 – 2023-07-27 (×2): qty 90, 90d supply, fill #0
  Filled 2023-10-14: qty 90, 90d supply, fill #1
  Filled 2023-10-16: qty 90, 90d supply, fill #0

## 2023-07-27 ENCOUNTER — Other Ambulatory Visit (HOSPITAL_BASED_OUTPATIENT_CLINIC_OR_DEPARTMENT_OTHER): Payer: Self-pay

## 2023-07-29 ENCOUNTER — Encounter: Payer: Self-pay | Admitting: Family Medicine

## 2023-07-29 ENCOUNTER — Telehealth: Payer: Self-pay | Admitting: Family Medicine

## 2023-07-29 NOTE — Telephone Encounter (Signed)
Will you guys please attach 90 day compliance report for review? TY!

## 2023-07-30 ENCOUNTER — Other Ambulatory Visit: Payer: Self-pay

## 2023-08-22 ENCOUNTER — Other Ambulatory Visit (HOSPITAL_BASED_OUTPATIENT_CLINIC_OR_DEPARTMENT_OTHER): Payer: Self-pay

## 2023-08-23 ENCOUNTER — Other Ambulatory Visit (HOSPITAL_BASED_OUTPATIENT_CLINIC_OR_DEPARTMENT_OTHER): Payer: Self-pay

## 2023-08-23 ENCOUNTER — Other Ambulatory Visit: Payer: Self-pay

## 2023-08-28 ENCOUNTER — Other Ambulatory Visit (HOSPITAL_BASED_OUTPATIENT_CLINIC_OR_DEPARTMENT_OTHER): Payer: Self-pay

## 2023-08-28 MED ORDER — FLULAVAL 0.5 ML IM SUSY
0.5000 mL | PREFILLED_SYRINGE | Freq: Once | INTRAMUSCULAR | 0 refills | Status: AC
  Filled 2023-08-28: qty 0.5, 1d supply, fill #0

## 2023-08-28 MED ORDER — COMIRNATY 30 MCG/0.3ML IM SUSY
0.3000 mL | PREFILLED_SYRINGE | Freq: Once | INTRAMUSCULAR | 0 refills | Status: AC
  Filled 2023-08-28: qty 0.3, 1d supply, fill #0

## 2023-08-29 ENCOUNTER — Other Ambulatory Visit (HOSPITAL_BASED_OUTPATIENT_CLINIC_OR_DEPARTMENT_OTHER): Payer: Self-pay

## 2023-09-05 ENCOUNTER — Other Ambulatory Visit (HOSPITAL_BASED_OUTPATIENT_CLINIC_OR_DEPARTMENT_OTHER): Payer: Self-pay

## 2023-09-28 ENCOUNTER — Other Ambulatory Visit (HOSPITAL_BASED_OUTPATIENT_CLINIC_OR_DEPARTMENT_OTHER): Payer: Self-pay

## 2023-09-30 ENCOUNTER — Other Ambulatory Visit (HOSPITAL_BASED_OUTPATIENT_CLINIC_OR_DEPARTMENT_OTHER): Payer: Self-pay

## 2023-09-30 ENCOUNTER — Other Ambulatory Visit: Payer: Self-pay | Admitting: Family Medicine

## 2023-09-30 MED ORDER — AMPHETAMINE-DEXTROAMPHET ER 30 MG PO CP24
30.0000 mg | ORAL_CAPSULE | Freq: Every day | ORAL | 0 refills | Status: DC
  Filled 2023-09-30: qty 90, 90d supply, fill #0

## 2023-10-14 ENCOUNTER — Other Ambulatory Visit (HOSPITAL_COMMUNITY): Payer: Self-pay

## 2023-10-15 ENCOUNTER — Other Ambulatory Visit: Payer: Self-pay

## 2023-10-16 ENCOUNTER — Other Ambulatory Visit (HOSPITAL_BASED_OUTPATIENT_CLINIC_OR_DEPARTMENT_OTHER): Payer: Self-pay

## 2023-10-17 ENCOUNTER — Other Ambulatory Visit: Payer: Self-pay

## 2023-11-21 ENCOUNTER — Other Ambulatory Visit (HOSPITAL_BASED_OUTPATIENT_CLINIC_OR_DEPARTMENT_OTHER): Payer: Self-pay

## 2023-11-25 ENCOUNTER — Encounter: Payer: Self-pay | Admitting: Family Medicine

## 2023-11-25 ENCOUNTER — Ambulatory Visit: Payer: Commercial Managed Care - PPO | Admitting: Family Medicine

## 2023-11-25 VITALS — BP 133/79 | HR 68 | Wt 254.6 lb

## 2023-11-25 DIAGNOSIS — F988 Other specified behavioral and emotional disorders with onset usually occurring in childhood and adolescence: Secondary | ICD-10-CM | POA: Diagnosis not present

## 2023-11-25 DIAGNOSIS — F3341 Major depressive disorder, recurrent, in partial remission: Secondary | ICD-10-CM

## 2023-11-25 DIAGNOSIS — R7303 Prediabetes: Secondary | ICD-10-CM | POA: Diagnosis not present

## 2023-11-25 DIAGNOSIS — Z23 Encounter for immunization: Secondary | ICD-10-CM | POA: Diagnosis not present

## 2023-11-25 LAB — BASIC METABOLIC PANEL
BUN: 13 mg/dL (ref 6–23)
CO2: 29 meq/L (ref 19–32)
Calcium: 9.4 mg/dL (ref 8.4–10.5)
Chloride: 104 meq/L (ref 96–112)
Creatinine, Ser: 0.76 mg/dL (ref 0.40–1.20)
GFR: 84.33 mL/min (ref >60.00)
Glucose, Bld: 97 mg/dL (ref 70–99)
Potassium: 4.8 meq/L (ref 3.5–5.1)
Sodium: 140 meq/L (ref 135–145)

## 2023-11-25 LAB — HEMOGLOBIN A1C: Hgb A1c MFr Bld: 6.3 % (ref 4.6–6.5)

## 2023-11-25 NOTE — Progress Notes (Signed)
 OFFICE VISIT  11/25/2023  CC:  Chief Complaint  Patient presents with   Medical Management of Chronic Issues    Pt is fasting.     Patient is a 63 y.o. female who presents for 23-month follow-up adult ADD, depression, and prediabetes. A/P as of last visit: #1 adult ADD, doing well on Adderall  ER 30 mg daily and an occasional as needed dose of Adderall  10 mg in the afternoon.   #2 recurrent major depressive disorder, GAD. She is stable on Prozac  20 mg a day and Wellbutrin  XL 300 mg a day. She did an online talk therapy with a provider once.  She is considering doing something similar again soon.   #3 prediabetes. Adherence to diet waxes and wanes.  Weight waxes and wanes. Hemoglobin A1c has improved nicely since getting on metformin  500 mg a day. Will check hemoglobin A1c today.  INTERIM HX: Donna Willis is feeling fine, although she is embarrassed that she has gained weight since last visit.  She has not been eating healthy diet lately and has not been walking for exercise anymore.  She simply has been out of her routine.  Recently has had several weeks of some tickle type cough.  Little bit improvement when she uses her Flovent  consistently.  She has not had to use her rescue inhaler. No fever or shortness of breath.  Pt states all is going well with the med at current dosing (Adderall  XR 30 mg every morning and Adderall  10 mg q. afternoon as needed): much improved focus, concentration, task completion.  Less frustration, better multitasking, less impulsivity and restlessness.  Mood and anxiety levels are stable. No side effects from the medication.   PMP AWARE reviewed today: most recent rx for Adderall  XR 30 mg Was filled 09/30/2023, # 90, rx by me.  Most recent Adderall  10 mg was filled 11/22/2022, #90, prescription by me. No red flags.   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 Nicholaus: hasn't seen him in yrs or so, was on elmiron and coldn't tell much difference.   MDD (major depressive disorder)    Mild persistent asthma 08/31/2013   [facility] pulm: stable as of pulm f/u 02/2018 and 05/2019: no med changes made---1 yr f/u recommended.   OSA on CPAP 07/2020   Personal history of  adenomatous colonic polyp 10/28/2012   10/2012 - 4x10 mm tubular adenoma; repeat 09/03/16 showed NO POLYPS.  Recall 5 yrs (08/2021).   Prediabetes 08/2019   08/20/19 HbA1c 6.4%->recommended metformin  at that time.   Sleep apnea     Past Surgical History:  Procedure Laterality Date   CARDIOVASCULAR STRESS TEST  09/2012   Rest/Stress myoview: normal   CESAREAN SECTION  1988   CHOLECYSTECTOMY  2002   COLONOSCOPY     09/27/22, one adenoma, +diverticulosis and redundant colon.  Recall 7 yrs   COLONOSCOPY W/ POLYPECTOMY  10/22/12; 09/03/16  2013; tubular adenoma, no high grade dysplasia (Dr. Avram).  Repeat 08/2016: no polyps.  Recall 5 yrs (08/2021--Dr. Avram).   CPAP  07/2020   CYSTOSCOPY     with bladder bx 07/22/18-->   EPIGASTRIC HERNIA REPAIR N/A 06/26/2019   Procedure: LAPAROSCOPIC EPIGASTRIC HERNIA REPAIR WITH MESH;  Surgeon: Ethyl Lenis, MD;  Location: [facility];  Service: General;  Laterality: N/A;   HERNIA REPAIR  2003   umbilical, during lap chole   POLYPECTOMY     TONSILLECTOMY AND ADENOIDECTOMY  1974   UMBILICAL HERNIA REPAIR  08/19/2012   Procedure: LAPAROSCOPIC UMBILICAL HERNIA;  Surgeon: Lenis VEAR Ethyl, MD;  Location: [facility];  Service: General;  Laterality: N/A;   wisdom  teeth removal      Outpatient Medications Prior to Visit  Medication Sig Dispense Refill   acetaminophen  (TYLENOL ) 500 MG tablet Take 1,000 mg by mouth every 6 (six) hours as needed for moderate pain.     amphetamine -dextroamphetamine  (ADDERALL  XR) 30 MG 24 hr capsule Take 1 capsule (30 mg total) by mouth daily. 90 capsule 0   amphetamine -dextroamphetamine  (ADDERALL ) 10 MG tablet Take 1 tablet (10 mg total) by mouth daily in the afternoon. 90 tablet 0   buPROPion  (WELLBUTRIN  XL) 300 MG 24 hr tablet Take 1 tablet (300 mg total) by mouth daily. 90 tablet 3   Cinnamon 500 MG capsule Take 500 mg by mouth daily.     estradiol  (VIVELLE -DOT) 0.075 MG/24HR Place 1 patch onto the skin 2 (two) times a week. 24 patch 4   famotidine (PEPCID) 20 MG tablet Take 20 mg by mouth as needed for heartburn or indigestion.     FLUoxetine  (PROZAC ) 20 MG capsule Take 1 capsule (20 mg total) by mouth daily. 90 capsule 1   fluticasone  (FLOVENT  HFA) 110 MCG/ACT inhaler Inhale 2 puffs into the lungs daily. 12 g 11   Ibuprofen  (MOTRIN  PO) Take by mouth as needed.     Lactobacillus (ACIDOPHILUS) 100 MG CAPS Take by mouth in the morning and at bedtime.     Melatonin 10 MG CAPS Take by mouth.     metFORMIN  (GLUCOPHAGE ) 500 MG tablet Take 1 tablet (500 mg total) by mouth every evening. 90 tablet 3   mupirocin  ointment (BACTROBAN ) 2 % Apply 1 application topically 3 (three) times daily. 22 g 1   progesterone  (PROMETRIUM ) 100 MG capsule Take 1 capsule (100 mg total) by mouth daily. 90 capsule 4   COVID-19 mRNA vaccine 2023-2024 (COMIRNATY ) syringe Inject into the muscle. 0.3 mL 0   No facility-administered medications prior to visit.    Allergies  Allergen Reactions   Cipro [Ciprofloxacin Hcl] Itching   Ciprofloxacin Itching    Review of Systems As per HPI  PE:    11/25/2023    8:09 AM 06/28/2023    8:42 AM 05/23/2023    8:53 AM  Vitals with BMI  Height  5' 3.75 5' 5  Weight 254 lbs 10 oz 242 lbs 235 lbs 13 oz   BMI  41.88 39.24  Systolic 133 124 881  Diastolic 79 72 83  Pulse 68  69    Physical Exam  Gen: Alert, well appearing.  Patient is oriented to person, place, time, and situation. AFFECT: pleasant, lucid thought and speech. CV: RRR, no m/r/g.   LUNGS: CTA bilat, nonlabored resps, good aeration in all lung fields.   LABS:  Last CBC Lab Results  Component Value Date   WBC 6.6 05/23/2023   HGB 14.0 05/23/2023  HCT 43.0 05/23/2023   MCV 91.4 05/23/2023   MCH 28.3 06/23/2019   RDW 13.7 05/23/2023   PLT 277.0 05/23/2023   Last metabolic panel Lab Results  Component Value Date   GLUCOSE 92 05/23/2023   NA 140 05/23/2023   K 4.3 05/23/2023   CL 106 05/23/2023   CO2 24 05/23/2023   BUN 18 05/23/2023   CREATININE 0.80 05/23/2023   GFR 79.58 05/23/2023   CALCIUM 9.5 05/23/2023   PROT 7.3 05/23/2023   ALBUMIN 4.3 05/23/2023   BILITOT 0.5 05/23/2023   ALKPHOS 55 05/23/2023   AST 19 05/23/2023   ALT 25 05/23/2023   ANIONGAP 10 04/26/2019   Last lipids Lab Results  Component Value Date   CHOL 254 (H) 05/23/2023   HDL 55.80 05/23/2023   LDLCALC 102 (H) 10/03/2020   LDLDIRECT 155.0 05/23/2023   TRIG 213.0 (H) 05/23/2023   CHOLHDL 5 05/23/2023   Last hemoglobin A1c Lab Results  Component Value Date   HGBA1C 6.0 05/23/2023   Last thyroid  functions Lab Results  Component Value Date   TSH 4.69 05/23/2023   IMPRESSION AND PLAN:  #1 adult ADD, doing well on Adderall  ER 30 mg daily and an occasional as needed dose of Adderall  10 mg in the afternoon.   #2 recurrent major depressive disorder, GAD. She is stable on Prozac  20 mg a day and Wellbutrin  XL 300 mg a day.   #3 prediabetes. Adherence to diet waxes and wanes.  Weight waxes and wanes. Hemoglobin A1c has improved nicely since getting on metformin  500 mg a day-->6.0% at last check 6 mo ago. Will check hemoglobin A1c today.  An After Visit Summary was printed and given to the patient.  FOLLOW UP: Return in  about 6 months (around 05/24/2024) for annual CPE (fasting).  Signed:  Gerlene Hockey, MD           11/25/2023

## 2023-11-26 NOTE — Telephone Encounter (Signed)
 No further action needed.

## 2024-01-01 ENCOUNTER — Inpatient Hospital Stay: Admission: RE | Admit: 2024-01-01 | Payer: Commercial Managed Care - PPO | Source: Ambulatory Visit

## 2024-01-04 ENCOUNTER — Other Ambulatory Visit: Payer: Self-pay | Admitting: Family Medicine

## 2024-01-04 ENCOUNTER — Other Ambulatory Visit (HOSPITAL_BASED_OUTPATIENT_CLINIC_OR_DEPARTMENT_OTHER): Payer: Self-pay

## 2024-01-05 ENCOUNTER — Ambulatory Visit
Admission: EM | Admit: 2024-01-05 | Discharge: 2024-01-05 | Disposition: A | Discharge status: Home / Self Care | Payer: Commercial Managed Care - PPO | Attending: Family Medicine | Admitting: Family Medicine

## 2024-01-05 ENCOUNTER — Other Ambulatory Visit: Payer: Self-pay

## 2024-01-05 DIAGNOSIS — R059 Cough, unspecified: Secondary | ICD-10-CM | POA: Diagnosis not present

## 2024-01-05 DIAGNOSIS — J40 Bronchitis, not specified as acute or chronic: Secondary | ICD-10-CM

## 2024-01-05 MED ORDER — PREDNISONE 10 MG (21) PO TBPK: ORAL_TABLET | Freq: Every day | ORAL | 0 refills | Status: DC

## 2024-01-05 MED ORDER — DOXYCYCLINE HYCLATE 100 MG PO CAPS: 100.0000 mg | ORAL_CAPSULE | Freq: Two times a day (BID) | ORAL | 0 refills | Status: AC

## 2024-01-05 MED ORDER — PROMETHAZINE-DM 6.25-15 MG/5ML PO SYRP: 5.0000 mL | ORAL_SOLUTION | Freq: Two times a day (BID) | ORAL | 0 refills | Status: DC | PRN

## 2024-01-05 MED ORDER — METHYLPREDNISOLONE SODIUM SUCC 125 MG IJ SOLR
125.0000 mg | Freq: Once | INTRAMUSCULAR | Status: AC
  Administered 2024-01-05: 125 mg via INTRAMUSCULAR

## 2024-01-05 NOTE — ED Provider Notes (Signed)
 [facility]    CSN: [id number] Arrival date & time: 01/05/24  1426      History   Chief Complaint Chief Complaint  Patient presents with   Cough    HPI Donna Willis is a 63 y.o. female.   HPI pleasant 63 year old female presents with cough for 1 week.  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 Earlene Plater: hasn't seen him in yrs or so, was on elmiron and coldn't tell much difference.   MDD (major depressive disorder)    Mild persistent asthma 08/31/2013   [facility] pulm: stable as of pulm f/u 02/2018 and 05/2019: no med changes made---1 yr f/u recommended.   OSA on CPAP 07/2020   Personal history of  adenomatous colonic polyp 10/28/2012   10/2012 - 4x10 mm tubular adenoma; repeat 09/03/16 showed NO POLYPS.  Recall 5 yrs (08/2021).   Prediabetes 08/2019   08/20/19 HbA1c 6.4%->recommended metformin at that time.   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 08/18/2012    Past Surgical History:  Procedure Laterality Date   CARDIOVASCULAR STRESS TEST  09/2012   Rest/Stress myoview: normal   CESAREAN SECTION  1988   CHOLECYSTECTOMY  2002   COLONOSCOPY     09/27/22, one adenoma, +diverticulosis and redundant colon.  Recall 7 yrs   COLONOSCOPY W/ POLYPECTOMY  10/22/12; 09/03/16   2013; tubular adenoma, no high grade dysplasia (Dr. Leone Payor).  Repeat 08/2016: no polyps.  Recall 5 yrs (08/2021--Dr. Leone Payor).   CPAP  07/2020   CYSTOSCOPY     with bladder bx 07/22/18-->   EPIGASTRIC HERNIA REPAIR N/A 06/26/2019   Procedure: LAPAROSCOPIC EPIGASTRIC HERNIA REPAIR WITH MESH;  Surgeon: Ovidio Kin, MD;  Location: [facility];  Service: General;  Laterality: N/A;   HERNIA REPAIR  2003   umbilical, during lap chole   POLYPECTOMY     TONSILLECTOMY AND ADENOIDECTOMY  1974   UMBILICAL HERNIA REPAIR  08/19/2012   Procedure: LAPAROSCOPIC UMBILICAL HERNIA;  Surgeon: Kandis Cocking, MD;  Location: [facility];  Service: General;  Laterality: N/A;   wisdom teeth removal      OB History     Gravida  1   Para  1   Term  Preterm      AB      Living  1      SAB      IAB      Ectopic      Multiple      Live Births  1            Home Medications    Prior to Admission medications   Medication Sig Start Date End Date Taking? Authorizing Provider  doxycycline (VIBRAMYCIN) 100 MG capsule Take 1 capsule (100 mg total) by mouth 2 (two) times daily for 10 days. 01/05/24 01/15/24 Yes Trevor Iha, FNP  predniSONE (STERAPRED UNI-PAK 21 TAB) 10 MG (21) TBPK tablet Take by mouth daily. Take 6 tabs by mouth daily  for 2 days, then 5 tabs for 2 days, then 4 tabs for 2 days, then 3 tabs for 2 days, 2 tabs for 2 days, then 1 tab by mouth daily for 2 days 01/05/24  Yes Trevor Iha, FNP  promethazine-dextromethorphan  (PROMETHAZINE-DM) 6.25-15 MG/5ML syrup Take 5 mLs by mouth 2 (two) times daily as needed for cough. 01/05/24  Yes Trevor Iha, FNP  acetaminophen (TYLENOL) 500 MG tablet Take 1,000 mg by mouth every 6 (six) hours as needed for moderate pain.    [provider]  amphetamine-dextroamphetamine (ADDERALL XR) 30 MG 24 hr capsule Take 1 capsule (30 mg total) by mouth daily. 09/30/23   McGowen, Maryjean Morn, MD  amphetamine-dextroamphetamine (ADDERALL) 10 MG tablet Take 1 tablet (10 mg total) by mouth daily in the afternoon. 11/22/22   McGowen, Maryjean Morn, MD  buPROPion (WELLBUTRIN XL) 300 MG 24 hr tablet Take 1 tablet (300 mg total) by mouth daily. 06/25/23   McGowen, Maryjean Morn, MD  Cinnamon 500 MG capsule Take 500 mg by mouth daily.    [provider]  estradiol (VIVELLE-DOT) 0.075 MG/24HR Place 1 patch onto the skin 2 (two) times a week. 07/01/23   Chrzanowski, Jami B, NP  famotidine (PEPCID) 20 MG tablet Take 20 mg by mouth as needed for heartburn or indigestion.    [provider]  FLUoxetine (PROZAC) 20 MG capsule Take 1 capsule (20 mg total) by mouth daily. 07/25/23   McGowen, Maryjean Morn, MD  fluticasone (FLOVENT HFA) 110 MCG/ACT inhaler Inhale 2 puffs into the lungs daily. 05/23/23 05/22/24  McGowen, Maryjean Morn, MD  Ibuprofen (MOTRIN PO) Take by mouth as needed.    [provider]  Lactobacillus (ACIDOPHILUS) 100 MG CAPS Take by mouth in the morning and at bedtime.    [provider]  Melatonin 10 MG CAPS Take by mouth.    [provider]  metFORMIN (GLUCOPHAGE) 500 MG tablet Take 1 tablet (500 mg total) by mouth every evening. 05/23/23   McGowen, Maryjean Morn, MD  mupirocin ointment (BACTROBAN) 2 % Apply 1 application topically 3 (three) times daily. 08/16/21   McGowen, Maryjean Morn, MD  progesterone (PROMETRIUM) 100 MG capsule Take 1 capsule (100 mg total) by mouth daily. 06/28/23   Chrzanowski, Lamona Curl, NP    Family History Family History  Problem Relation Age of  Onset   Colon polyps Mother    Diverticulitis Father    Kidney failure Brother    Kidney disease Brother    Other Maternal Uncle        great-stom or colon cancer   Colon cancer Maternal Uncle    Rectal cancer Neg Hx    Esophageal cancer Neg Hx    Stomach cancer Neg Hx  Social History Social History   Tobacco Use   Smoking status: Never    Passive exposure: Never   Smokeless tobacco: Never  Vaping Use   Vaping status: Never Used  Substance Use Topics   Alcohol use: Yes    Comment: once per year   Drug use: Never     Allergies   Cipro [ciprofloxacin hcl] and Ciprofloxacin   Review of Systems Review of Systems   Physical Exam Triage Vital Signs ED Triage Vitals  Encounter Vitals Group     BP 01/05/24 1451 135/81     Systolic BP Percentile --      Diastolic BP Percentile --      Pulse Rate 01/05/24 1451 78     Resp 01/05/24 1451 16     Temp 01/05/24 1451 97.6 F (36.4 C)     Temp src --      SpO2 01/05/24 1451 93 %     Weight --      Height --      Head Circumference --      Peak Flow --      Pain Score 01/05/24 1450 0     Pain Loc --      Pain Education --      Exclude from Growth Chart --    No data found.  Updated Vital Signs BP 135/81   Pulse 78   Temp 97.6 F (36.4 C)   Resp 16   LMP 05/30/2019 (Exact Date)   SpO2 93%    Physical Exam Vitals and nursing note reviewed.  Constitutional:      Appearance: Normal appearance. She is normal weight.  HENT:     Head: Normocephalic and atraumatic.     Right Ear: Tympanic membrane, ear canal and external ear normal.     Left Ear: Tympanic membrane, ear canal and external ear normal.     Mouth/Throat:     Mouth: Mucous membranes are moist.     Pharynx: Oropharynx is clear.  Eyes:     Extraocular Movements: Extraocular movements intact.     Conjunctiva/sclera: Conjunctivae normal.     Pupils: Pupils are equal, round, and reactive to light.  Cardiovascular:     Rate and Rhythm: Normal rate  and regular rhythm.     Pulses: Normal pulses.     Heart sounds: Normal heart sounds.  Pulmonary:     Effort: Pulmonary effort is normal.     Breath sounds: Normal breath sounds. No wheezing, rhonchi or rales.  Musculoskeletal:        General: Normal range of motion.     Cervical back: Normal range of motion and neck supple.  Skin:    General: Skin is warm and dry.  Neurological:     General: No focal deficit present.     Mental Status: She is alert and oriented to person, place, and time. Mental status is at baseline.      UC Treatments / Results  Labs (all labs ordered are listed, but only abnormal results are displayed) Labs Reviewed - No data to display  EKG   Radiology No results found.  Procedures Procedures (including critical care time)  Medications Ordered in UC Medications  methylPREDNISolone sodium succinate (SOLU-MEDROL) 125 mg/2 mL injection 125 mg (125 mg Intramuscular Given 01/05/24 1525)    Initial Impression / Assessment and Plan / UC Course  I have reviewed the triage vital signs and the nursing notes.  Pertinent labs & imaging results that were  available during my care of the patient were reviewed by me and considered in my medical decision making (see chart for details).     MDM: 1.  Cough, unspecified-Rx'd doxycycline 100 mg capsule: Take 1 capsule twice daily x 10 days, Rx'd Promethazine DM 6.25-15 Mg/5 mL syrup: Take 5 mL twice daily, as needed for cough; 2.  Bronchitis-IM Solu-Medrol 125 mg given once in clinic and prior to discharge, Rx'd Sterapred Unipak 21 tab (tapering from 60 mg to 10 mg over 10 days). Advised patient to take medications as directed with food to completion.  Advised patient to take prednisone with first dose of doxycycline for the next 10 days.  Advised may take Promethazine DM at night for cough prior to sleep due to sedative effects.  Encouraged to increase daily water intake to 64 ounces per day while taking these  medications.  Advised if symptoms worsen and/or unresolved please follow-up with PCP or here for further evaluation. Final Clinical Impressions(s) / UC Diagnoses   Final diagnoses:  Cough, unspecified type  Bronchitis     Discharge Instructions      Advised patient to take medications as directed with food to completion.  Advised patient to take prednisone with first dose of doxycycline for the next 10 days.  Advised may take Promethazine DM at night for cough prior to sleep due to sedative effects.  Encouraged to increase daily water intake to 64 ounces per day while taking these medications.  Advised if symptoms worsen and/or unresolved please follow-up with PCP or here for further evaluation.     ED Prescriptions     Medication Sig Dispense Auth. Provider   doxycycline (VIBRAMYCIN) 100 MG capsule Take 1 capsule (100 mg total) by mouth 2 (two) times daily for 10 days. 20 capsule Trevor Iha, FNP   predniSONE (STERAPRED UNI-PAK 21 TAB) 10 MG (21) TBPK tablet Take by mouth daily. Take 6 tabs by mouth daily  for 2 days, then 5 tabs for 2 days, then 4 tabs for 2 days, then 3 tabs for 2 days, 2 tabs for 2 days, then 1 tab by mouth daily for 2 days 42 tablet Trevor Iha, FNP   promethazine-dextromethorphan (PROMETHAZINE-DM) 6.25-15 MG/5ML syrup Take 5 mLs by mouth 2 (two) times daily as needed for cough. 118 mL Trevor Iha, FNP      PDMP not reviewed this encounter.   Trevor Iha, FNP 01/05/24 1534

## 2024-01-05 NOTE — ED Triage Notes (Signed)
 Pt presents to uc with co of cough for 1 week  Pt reports she thinks she had had flu with cough body aches and chills cough remains while other symptoms have improvement. Pt reports musinex and inhaler.

## 2024-01-05 NOTE — Discharge Instructions (Addendum)
 Advised patient to take medications as directed with food to completion.  Advised patient to take prednisone with first dose of doxycycline for the next 10 days.  Advised may take Promethazine DM at night for cough prior to sleep due to sedative effects.  Encouraged to increase daily water intake to 64 ounces per day while taking these medications.  Advised if symptoms worsen and/or unresolved please follow-up with PCP or here for further evaluation.

## 2024-01-06 ENCOUNTER — Other Ambulatory Visit (HOSPITAL_BASED_OUTPATIENT_CLINIC_OR_DEPARTMENT_OTHER): Payer: Self-pay

## 2024-01-06 MED ORDER — AMPHETAMINE-DEXTROAMPHET ER 30 MG PO CP24
30.0000 mg | ORAL_CAPSULE | Freq: Every day | ORAL | 0 refills | Status: DC
  Filled 2024-01-06: qty 90, 90d supply, fill #0

## 2024-01-06 NOTE — Telephone Encounter (Signed)
 Requesting: adderall 30mg  Contract: N/A UDS: N/A Last Visit: 12/13/23 Next Visit: 06/15/24 Last Refill: 09/30/23 (90,0)  Please Advise. Med pending

## 2024-01-11 ENCOUNTER — Other Ambulatory Visit (HOSPITAL_BASED_OUTPATIENT_CLINIC_OR_DEPARTMENT_OTHER): Payer: Self-pay

## 2024-01-27 DIAGNOSIS — G4733 Obstructive sleep apnea (adult) (pediatric): Secondary | ICD-10-CM | POA: Diagnosis not present

## 2024-02-04 ENCOUNTER — Other Ambulatory Visit (HOSPITAL_BASED_OUTPATIENT_CLINIC_OR_DEPARTMENT_OTHER): Payer: Self-pay

## 2024-02-04 ENCOUNTER — Other Ambulatory Visit: Payer: Self-pay | Admitting: Family Medicine

## 2024-02-04 ENCOUNTER — Other Ambulatory Visit: Payer: Self-pay

## 2024-02-04 MED ORDER — FLUOXETINE HCL 20 MG PO CAPS
20.0000 mg | ORAL_CAPSULE | Freq: Every day | ORAL | 0 refills | Status: DC
  Filled 2024-02-04: qty 90, 90d supply, fill #0

## 2024-02-24 ENCOUNTER — Other Ambulatory Visit (HOSPITAL_BASED_OUTPATIENT_CLINIC_OR_DEPARTMENT_OTHER): Payer: Self-pay

## 2024-04-02 ENCOUNTER — Other Ambulatory Visit (HOSPITAL_BASED_OUTPATIENT_CLINIC_OR_DEPARTMENT_OTHER): Payer: Self-pay

## 2024-04-13 ENCOUNTER — Other Ambulatory Visit (HOSPITAL_BASED_OUTPATIENT_CLINIC_OR_DEPARTMENT_OTHER): Payer: Self-pay

## 2024-04-13 ENCOUNTER — Other Ambulatory Visit: Payer: Self-pay | Admitting: Family Medicine

## 2024-04-13 MED ORDER — AMPHETAMINE-DEXTROAMPHET ER 30 MG PO CP24
30.0000 mg | ORAL_CAPSULE | Freq: Every day | ORAL | 0 refills | Status: DC
  Filled 2024-04-13 (×2): qty 90, 90d supply, fill #0

## 2024-04-13 NOTE — Telephone Encounter (Signed)
 Requesting: adderall  xr 30mg  Contract: N/A UDS: N/A Last Visit: 11/25/23 Next Visit: 06/15/24 Last Refill: 01/06/24 (90,0)  Please Advise. Rx pending

## 2024-04-29 DIAGNOSIS — G4733 Obstructive sleep apnea (adult) (pediatric): Secondary | ICD-10-CM | POA: Diagnosis not present

## 2024-05-08 ENCOUNTER — Other Ambulatory Visit (HOSPITAL_BASED_OUTPATIENT_CLINIC_OR_DEPARTMENT_OTHER): Payer: Self-pay

## 2024-05-08 ENCOUNTER — Other Ambulatory Visit: Payer: Self-pay | Admitting: Family Medicine

## 2024-05-08 MED ORDER — FLUOXETINE HCL 20 MG PO CAPS
20.0000 mg | ORAL_CAPSULE | Freq: Every day | ORAL | 0 refills | Status: DC
  Filled 2024-05-08: qty 90, 90d supply, fill #0

## 2024-05-22 ENCOUNTER — Other Ambulatory Visit: Payer: Self-pay | Admitting: Family Medicine

## 2024-05-25 ENCOUNTER — Other Ambulatory Visit (HOSPITAL_BASED_OUTPATIENT_CLINIC_OR_DEPARTMENT_OTHER): Payer: Self-pay

## 2024-05-25 MED ORDER — METFORMIN HCL 500 MG PO TABS
500.0000 mg | ORAL_TABLET | Freq: Every evening | ORAL | 0 refills | Status: DC
  Filled 2024-05-25: qty 90, 90d supply, fill #0

## 2024-06-05 DIAGNOSIS — H5213 Myopia, bilateral: Secondary | ICD-10-CM | POA: Diagnosis not present

## 2024-06-10 NOTE — Progress Notes (Unsigned)
 SABRA

## 2024-06-10 NOTE — Patient Instructions (Signed)

## 2024-06-10 NOTE — Progress Notes (Unsigned)
 PATIENT: Donna Willis DOB: 14-Jun-1961  REASON FOR VISIT: follow up HISTORY FROM: patient  Virtual Visit via Mychart Video Note  I connected with Donna Willis on 06/11/24 at  9:30 AM EDT by telephone and verified that I am speaking with the correct person using two identifiers.   I discussed the limitations, risks, security and privacy concerns of performing an evaluation and management service by video and the availability of in person appointments. I also discussed with the patient that there may be a patient responsible charge related to this service. The patient expressed understanding and agreed to proceed.   History of Present Illness:  06/11/24 ALL (Mychart): Donna Willis returns for follow up for OSA on CPAP. She is doing well on therapy. She is using CPAP nightly for about 7 hours, on average. She denies concerns with machine or supplies. She does note significant benefit in sleep quality and daytime energy on therapy.     06/11/2023 ALL (Mychart): Donna Willis is a 63 y.o. female here today for follow up for OSA on CPAP. She was last seen by Donna Buck 06/2022 and doing well. She reports continuing to do well until the past month. She has been out of town more frequently caring for her family and has had some illnesses that limited use. She tested positive for Covid yesterday. She does note significant improvement in sleep quality when using CPAP. She denies concerns with machine or supplies.     History (copied from Donna Willis previous note)  Donna Willis is a 63 year old right-handed woman with an underlying medical history of interstitial cystitis, prediabetes, reflux disease, depression, asthma, allergic rhinitis, histoplasmosis, and obesity, who presents for follow-up consultation of her obstructive sleep apnea, on CPAP therapy.  The patient is unaccompanied today. I last saw her on 04/18/21, at which time she reported doing well.  She did have increase in stress and some increase  in daytime somnolence, attributed this to an increase in her antidepressant dose.   From the sleep apnea treatment standpoint she was doing well and advised to follow-up in 1 year.     Today, 06/19/2022: I reviewed CPAP compliance data from 05/18/2022 through 06/16/2022, which is a total of 30 days, during which time she used her machine 28 days with percent use days greater than 4 hours at 87%, indicating very good compliance with an average usage of 7 hours and 3 minutes, residual AHI at goal at 0.3/h, leak acceptable with the 95th percentile at 14.1 L/min on a pressure of 10 cm with EPR of 3.  She reports doing well with her machine, she is compliant with treatment and continues to benefit from it.  She is typically up-to-date with her supplies.  She uses a hybrid style fullface mask and her DME's choice home.  She had some changes in her medication regimen within the past year, Prozac  is down to 20 mg daily, Wellbutrin  300 mg daily and she has been on Adderall  20 mg once daily long-acting per PCP.  Her Epworth sleepiness score is 7 out of 24.   Observations/Objective:  Generalized: Well developed, in no acute distress  Mentation: Alert oriented to time, place, history taking. Follows all commands speech and language fluent   Assessment and Plan:  63 y.o. year old female  has a past medical history of Abdominal wall hernia, Allergic rhinitis, Allergy, Atypical chest pain (08/2012), Borderline hyperlipidemia (05/2018; 08/2019), Contact dermatitis, Daytime sleepiness, Epigastric hernia (04/2019), GERD (gastroesophageal reflux disease), Hay fever,  Hernia, History of histoplasmosis (08/31/2013), Hyperlipidemia, mixed, Interstitial cystitis, MDD (major depressive disorder), Mild persistent asthma (08/31/2013), OSA on CPAP (07/2020), Personal history of  adenomatous colonic polyp (10/28/2012), Prediabetes (08/2019), and Sleep apnea. here with     ICD-10-CM   1. OSA on CPAP  G47.33 For home use only DME  continuous positive airway pressure (CPAP)      Chance is doing well on CPAP therapy. Most recent 30 day compliance report shows excellent daily and four hour compliance. She was encouraged to continue CPAP nightly for at least 4 hours. We will update supply orders. She will follow up with me in 1 year, sooner if needed.    Orders Placed This Encounter  Procedures   For home use only DME continuous positive airway pressure (CPAP)    Heated Humidity with all supplies as needed    Length of Need:   Lifetime    Patient has OSA or probable OSA:   Yes    Is the patient currently using CPAP in the home:   Yes    Settings:   Other see comments    CPAP supplies needed:   Mask, headgear, cushions, filters, heated tubing and water chamber    No orders of the defined types were placed in this encounter.    Follow Up Instructions:  I discussed the assessment and treatment plan with the patient. The patient was provided an opportunity to ask questions and all were answered. The patient agreed with the plan and demonstrated an understanding of the instructions.   The patient was advised to call back or seek an in-person evaluation if the symptoms worsen or if the condition fails to improve as anticipated.  I provided 15 minutes of non-face-to-face time during this encounter. Patient located at their place of residence during Mychart visit. Provider is in the office.    Hamish Banks, NP

## 2024-06-11 ENCOUNTER — Encounter: Payer: Self-pay | Admitting: Family Medicine

## 2024-06-11 ENCOUNTER — Telehealth: Payer: Commercial Managed Care - PPO | Admitting: Family Medicine

## 2024-06-11 DIAGNOSIS — G4733 Obstructive sleep apnea (adult) (pediatric): Secondary | ICD-10-CM | POA: Diagnosis not present

## 2024-06-15 ENCOUNTER — Other Ambulatory Visit (HOSPITAL_BASED_OUTPATIENT_CLINIC_OR_DEPARTMENT_OTHER): Payer: Self-pay

## 2024-06-15 ENCOUNTER — Other Ambulatory Visit: Payer: Self-pay

## 2024-06-15 ENCOUNTER — Ambulatory Visit (INDEPENDENT_AMBULATORY_CARE_PROVIDER_SITE_OTHER): Payer: Commercial Managed Care - PPO | Admitting: Family Medicine

## 2024-06-15 VITALS — BP 115/75 | HR 67 | Temp 97.7°F | Ht 65.0 in | Wt 245.6 lb

## 2024-06-15 DIAGNOSIS — F988 Other specified behavioral and emotional disorders with onset usually occurring in childhood and adolescence: Secondary | ICD-10-CM | POA: Diagnosis not present

## 2024-06-15 DIAGNOSIS — Z79899 Other long term (current) drug therapy: Secondary | ICD-10-CM | POA: Diagnosis not present

## 2024-06-15 DIAGNOSIS — Z0000 Encounter for general adult medical examination without abnormal findings: Secondary | ICD-10-CM | POA: Diagnosis not present

## 2024-06-15 DIAGNOSIS — R7303 Prediabetes: Secondary | ICD-10-CM | POA: Diagnosis not present

## 2024-06-15 LAB — CBC WITH DIFFERENTIAL/PLATELET
Basophils Absolute: 0 K/uL (ref 0.0–0.1)
Basophils Relative: 1 % (ref 0.0–3.0)
Eosinophils Absolute: 0.2 K/uL (ref 0.0–0.7)
Eosinophils Relative: 3.9 % (ref 0.0–5.0)
HCT: 40.9 % (ref 36.0–46.0)
Hemoglobin: 13.1 g/dL (ref 12.0–15.0)
Lymphocytes Relative: 26.8 % (ref 12.0–46.0)
Lymphs Abs: 1.2 K/uL (ref 0.7–4.0)
MCHC: 32.1 g/dL (ref 30.0–36.0)
MCV: 89.7 fl (ref 78.0–100.0)
Monocytes Absolute: 0.5 K/uL (ref 0.1–1.0)
Monocytes Relative: 10.7 % (ref 3.0–12.0)
Neutro Abs: 2.6 K/uL (ref 1.4–7.7)
Neutrophils Relative %: 57.6 % (ref 43.0–77.0)
Platelets: 240 K/uL (ref 150.0–400.0)
RBC: 4.56 Mil/uL (ref 3.87–5.11)
RDW: 13.6 % (ref 11.5–15.5)
WBC: 4.5 K/uL (ref 4.0–10.5)

## 2024-06-15 LAB — COMPREHENSIVE METABOLIC PANEL WITH GFR
ALT: 22 U/L (ref 0–35)
AST: 21 U/L (ref 0–37)
Albumin: 4 g/dL (ref 3.5–5.2)
Alkaline Phosphatase: 56 U/L (ref 39–117)
BUN: 17 mg/dL (ref 6–23)
CO2: 27 meq/L (ref 19–32)
Calcium: 9 mg/dL (ref 8.4–10.5)
Chloride: 101 meq/L (ref 96–112)
Creatinine, Ser: 0.83 mg/dL (ref 0.40–1.20)
GFR: 75.57 mL/min (ref >60.00)
Glucose, Bld: 102 mg/dL — ABNORMAL HIGH (ref 70–99)
Potassium: 4.2 meq/L (ref 3.5–5.1)
Sodium: 137 meq/L (ref 135–145)
Total Bilirubin: 0.5 mg/dL (ref 0.2–1.2)
Total Protein: 6.9 g/dL (ref 6.0–8.3)

## 2024-06-15 LAB — LIPID PANEL
Cholesterol: 232 mg/dL — ABNORMAL HIGH (ref 0–200)
HDL: 63.1 mg/dL (ref >39.00)
LDL Cholesterol: 142 mg/dL — ABNORMAL HIGH (ref 0–99)
NonHDL: 169.22
Total CHOL/HDL Ratio: 4
Triglycerides: 135 mg/dL (ref 0.0–149.0)
VLDL: 27 mg/dL (ref 0.0–40.0)

## 2024-06-15 LAB — TSH: TSH: 3.76 u[IU]/mL (ref 0.35–5.50)

## 2024-06-15 LAB — HEMOGLOBIN A1C: Hgb A1c MFr Bld: 6.4 % (ref 4.6–6.5)

## 2024-06-15 MED ORDER — AMPHETAMINE-DEXTROAMPHETAMINE 10 MG PO TABS
10.0000 mg | ORAL_TABLET | Freq: Every day | ORAL | 0 refills | Status: DC
  Filled 2024-06-15: qty 90, 90d supply, fill #0

## 2024-06-15 MED ORDER — AMPHETAMINE-DEXTROAMPHET ER 30 MG PO CP24
30.0000 mg | ORAL_CAPSULE | Freq: Every day | ORAL | 0 refills | Status: DC
  Filled 2024-06-15 – 2024-07-11 (×3): qty 90, 90d supply, fill #0

## 2024-06-15 NOTE — Patient Instructions (Signed)

## 2024-06-15 NOTE — Progress Notes (Signed)
Office Note 06/15/2024  CC:  Chief Complaint  Patient presents with   Annual Exam    Pt is fasting   Patient is a 63 y.o. female who is here for annual health maintenance exam and 30-month follow-up adult ADD, depression, and prediabetes. A/P as of last visit: #1 adult ADD, doing well on Adderall  ER 30 mg daily and an occasional as needed dose of Adderall  10 mg in the afternoon.   #2 recurrent major depressive disorder, GAD. She is stable on Prozac  20 mg a day and Wellbutrin  XL 300 mg a day.   #3 prediabetes. Adherence to diet waxes and wanes.  Weight waxes and wanes. Hemoglobin A1c has improved nicely since getting on metformin  500 mg a day-->6.0% at last check 6 mo ago. Will check hemoglobin A1c today.  INTERIM HX: Her hemoglobin A1c rose to 6.3% last visit.  No change in medication was made. She feels well. She comments on concern about having a couple of episodes in the last couple of weeks of feeling like she is having balance issues.  She has not fallen. She brings this up mainly because of concern for Parkinson disease family history (her aunt).  ADD:  Pt states all is going well with the med at current dosing (Adderall  XR 30 mg daily, Adderall  10 mg in the afternoon as needed): much improved focus, concentration, task completion.  Less frustration, better multitasking, less impulsivity and restlessness.  Mood is stable. No side effects from the medication.  PMP AWARE reviewed today: most recent rx for Adderall  XR 30 mg was filled 04/13/2024, # 90, rx by me. Most recent Adderall  10 mg prescription filled 11/22/2022, #90, prescription by me. No red flags.  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 Nicholaus: hasn't seen him in yrs or so, was on elmiron and coldn't tell much difference.   MDD (major depressive disorder)    Mild persistent asthma 08/31/2013   [facility] pulm: stable as of pulm f/u 02/2018 and 05/2019: no med changes made---1 yr f/u recommended.   OSA on CPAP 07/2020   Personal history of  adenomatous colonic polyp 10/28/2012   10/2012 - 4x10 mm tubular adenoma; repeat 09/03/16 showed NO POLYPS.  Recall 5 yrs (08/2021).   Prediabetes 08/2019   08/20/19 HbA1c 6.4%->recommended metformin  at that time.   Sleep apnea     Past Surgical History:  Procedure Laterality Date   CARDIOVASCULAR STRESS TEST  09/2012   Rest/Stress myoview: normal   CESAREAN SECTION  1988   CHOLECYSTECTOMY  2002   COLONOSCOPY     09/27/22, one adenoma, +diverticulosis and redundant colon.  Recall 7 yrs   COLONOSCOPY W/ POLYPECTOMY  10/22/12; 09/03/16   2013; tubular adenoma, no high grade dysplasia (Dr. Avram).  Repeat 08/2016: no polyps.  Recall 5 yrs (08/2021--Dr. Avram).   CPAP  07/2020   CYSTOSCOPY     with bladder  bx 07/22/18-->   EPIGASTRIC HERNIA REPAIR N/A 06/26/2019   Procedure: LAPAROSCOPIC EPIGASTRIC HERNIA REPAIR WITH MESH;  Surgeon: Ethyl Lenis, MD;  Location: [facility];  Service: General;  Laterality: N/A;   HERNIA REPAIR  2003   umbilical, during lap chole   POLYPECTOMY     TONSILLECTOMY AND ADENOIDECTOMY  1974   UMBILICAL HERNIA REPAIR  08/19/2012   Procedure: LAPAROSCOPIC UMBILICAL HERNIA;  Surgeon: Lenis VEAR Ethyl, MD;  Location: [facility];  Service: General;  Laterality: N/A;   wisdom teeth removal      Family History  Problem Relation Age of Onset   Colon polyps Mother    Diverticulitis Father    Kidney failure Brother    Kidney disease Brother    Other  Maternal Uncle        great-stom or colon cancer   Colon cancer Maternal Uncle    Rectal cancer Neg Hx    Esophageal cancer Neg Hx    Stomach cancer Neg Hx     Social History   Socioeconomic History   Marital status: Married    Spouse name: Not on file   Number of children: Not on file   Years of education: Not on file   Highest education level: Bachelor's degree (e.g., BA, AB, BS)  Occupational History   Not on file  Tobacco Use   Smoking status: Never    Passive exposure: Never   Smokeless tobacco: Never  Vaping Use   Vaping status: Never Used  Substance and Sexual Activity   Alcohol use: Yes    Comment: once per year   Drug use: Never   Sexual activity: Not Currently    Partners: Male    Birth control/protection: Post-menopausal    Comment: menarche 63yo, sexual debut 63yo  Other Topics Concern   Not on file  Social History Narrative   Widow 2023,   Has 1 son.   Orig from [place]  but has lived in [place] >30 yrs.   Occupation: Charity fundraiser in Engineer, agricultural for [employer]: reading, crafts, bowling.Exercise: walks 40 min 3X/week.  No T/A/Ds   Social Drivers of Health   Financial Resource Strain: Low Risk  (06/15/2024)   Overall Financial Resource Strain (CARDIA)    Difficulty of Paying Living Expenses: Not hard at all  Food Insecurity: No Food Insecurity (06/15/2024)   Hunger Vital Sign    Worried About Running Out of Food in the Last Year: Never true    Ran Out of Food in the Last Year: Never true  Transportation Needs: No Transportation Needs (06/15/2024)   PRAPARE - Administrator, Civil Service (Medical): No    Lack of Transportation (Non-Medical): No  Physical Activity: Insufficiently Active (06/15/2024)   Exercise Vital Sign    Days of Exercise per Week: 3 days    Minutes of Exercise per Session: 40 min  Stress: No Stress Concern Present (06/15/2024)   [employer] of Occupational Health - Occupational Stress Questionnaire    Feeling of Stress: Only  a little  Social Connections: Moderately Integrated (06/15/2024)   Social Connection and Isolation Panel    Frequency of Communication with Friends and Family: More than three times a week    Frequency of Social Gatherings with Friends and Family: More than three times a week    Attends Religious Services: More than 4 times per year    Active Member of [employer] or Organizations: Yes    Attends Banker Meetings: More than 4 times  per year    Marital Status: Widowed  Intimate Partner Violence: Not on file    Outpatient Medications Prior to Visit  Medication Sig Dispense Refill   acetaminophen  (TYLENOL ) 500 MG tablet Take 1,000 mg by mouth every 6 (six) hours as needed for moderate pain.     amphetamine -dextroamphetamine  (ADDERALL  XR) 30 MG 24 hr capsule Take 1 capsule (30 mg total) by mouth daily. 90 capsule 0   amphetamine -dextroamphetamine  (ADDERALL ) 10 MG tablet Take 1 tablet (10 mg total) by mouth daily in the afternoon. 90 tablet 0   buPROPion  (WELLBUTRIN  XL) 300 MG 24 hr tablet Take 1 tablet (300 mg total) by mouth daily. 90 tablet 3   Cinnamon 500 MG capsule Take 500 mg by mouth daily.     estradiol  (VIVELLE -DOT) 0.075 MG/24HR Place 1 patch onto the skin 2 (two) times a week. 24 patch 4   famotidine (PEPCID) 20 MG tablet Take 20 mg by mouth as needed for heartburn or indigestion.     FLUoxetine  (PROZAC ) 20 MG capsule Take 1 capsule (20 mg total) by mouth daily. 90 capsule 0   fluticasone  (FLOVENT  HFA) 110 MCG/ACT inhaler Inhale 2 puffs into the lungs daily. 12 g 11   Ibuprofen  (MOTRIN  PO) Take by mouth as needed.     Lactobacillus (ACIDOPHILUS) 100 MG CAPS Take by mouth in the morning and at bedtime.     Melatonin 10 MG CAPS Take by mouth.     metFORMIN  (GLUCOPHAGE ) 500 MG tablet Take 1 tablet (500 mg total) by mouth every evening. 90 tablet 0   mupirocin  ointment (BACTROBAN ) 2 % Apply 1 application topically 3 (three) times daily. 22 g 1   predniSONE  (STERAPRED UNI-PAK 21  TAB) 10 MG (21) TBPK tablet Take by mouth daily. Take 6 tabs by mouth daily  for 2 days, then 5 tabs for 2 days, then 4 tabs for 2 days, then 3 tabs for 2 days, 2 tabs for 2 days, then 1 tab by mouth daily for 2 days 42 tablet 0   progesterone  (PROMETRIUM ) 100 MG capsule Take 1 capsule (100 mg total) by mouth daily. 90 capsule 4   promethazine -dextromethorphan (PROMETHAZINE -DM) 6.25-15 MG/5ML syrup Take 5 mLs by mouth 2 (two) times daily as needed for cough. 118 mL 0   No facility-administered medications prior to visit.    Allergies  Allergen Reactions   Cipro [Ciprofloxacin Hcl] Itching   Ciprofloxacin Itching    Review of Systems  Constitutional:  Negative for appetite change, chills, fatigue and fever.  HENT:  Negative for congestion, dental problem, ear pain and sore throat.   Eyes:  Negative for discharge, redness and visual disturbance.  Respiratory:  Negative for cough, chest tightness, shortness of breath and wheezing.   Cardiovascular:  Negative for chest pain, palpitations and leg swelling.  Gastrointestinal:  Negative for abdominal pain, blood in stool, diarrhea, nausea and vomiting.  Genitourinary:  Negative for difficulty urinating, dysuria, flank pain, frequency, hematuria and urgency.  Musculoskeletal:  Negative for arthralgias, back pain, joint swelling, myalgias and neck stiffness.  Skin:  Negative for pallor and rash.  Neurological:  Negative for dizziness, speech difficulty, weakness and headaches.  Hematological:  Negative for adenopathy. Does not bruise/bleed easily.  Psychiatric/Behavioral:  Negative for confusion and sleep disturbance. The patient is not nervous/anxious.    PE;    06/15/2024    7:59 AM 01/05/2024    2:51 PM 11/25/2023    8:09 AM  Vitals with BMI  Height 5' 5  Weight 245 lbs 10 oz  254 lbs 10 oz  BMI 40.87    Systolic 115 135 866  Diastolic 75 81 79  Pulse 67 78 68   Exam chaperoned by Jesusa Hahn, CMA  Gen: Alert, well appearing.   Patient is oriented to person, place, time, and situation. AFFECT: pleasant, lucid thought and speech. ENT: Ears: EACs clear, normal epithelium.  TMs with good light reflex and landmarks bilaterally.  Eyes: no injection, icteris, swelling, or exudate.  EOMI, PERRLA. Nose: no drainage or turbinate edema/swelling.  No injection or focal lesion.  Mouth: lips without lesion/swelling.  Oral mucosa pink and moist.  Dentition intact and without obvious caries or gingival swelling.  Oropharynx without erythema, exudate, or swelling.  Neck: supple/nontender.  No LAD, mass, or TM.  Carotid pulses 2+ bilaterally, without bruits. CV: RRR, no m/r/g.   LUNGS: CTA bilat, nonlabored resps, good aeration in all lung fields. ABD: soft, NT, ND, BS normal.  No hepatospenomegaly or mass.  No bruits. EXT: no clubbing, cyanosis, or edema.  Musculoskeletal: no joint swelling, erythema, warmth, or tenderness.  ROM of all joints intact. Skin - no sores or suspicious lesions or rashes or color changes  Pertinent labs:  Lab Results  Component Value Date   TSH 4.69 05/23/2023   Lab Results  Component Value Date   WBC 6.6 05/23/2023   HGB 14.0 05/23/2023   HCT 43.0 05/23/2023   MCV 91.4 05/23/2023   PLT 277.0 05/23/2023   Lab Results  Component Value Date   CREATININE 0.76 11/25/2023   BUN 13 11/25/2023   NA 140 11/25/2023   K 4.8 11/25/2023   CL 104 11/25/2023   CO2 29 11/25/2023   Lab Results  Component Value Date   ALT 25 05/23/2023   AST 19 05/23/2023   ALKPHOS 55 05/23/2023   BILITOT 0.5 05/23/2023   Lab Results  Component Value Date   CHOL 254 (H) 05/23/2023   Lab Results  Component Value Date   HDL 55.80 05/23/2023   Lab Results  Component Value Date   LDLCALC 102 (H) 10/03/2020   Lab Results  Component Value Date   TRIG 213.0 (H) 05/23/2023   Lab Results  Component Value Date   CHOLHDL 5 05/23/2023   Lab Results  Component Value Date   HGBA1C 6.3 11/25/2023   ASSESSMENT AND  PLAN:   #1 health maintenance exam: Reviewed age and gender appropriate health maintenance issues (prudent diet, regular exercise, health risks of tobacco and excessive alcohol, use of seatbelts, fire alarms in home, use of sunscreen).  Also reviewed age and gender appropriate health screening as well as vaccine recommendations. Vaccines: All up-to-date Labs: Fasting health panel ordered. Cervical ca screening: Per GYN MD: has appt 06/30/24. Breast ca screening: She is almost due for her annual mammogram. Colon ca screening: Recall 2030.  #2 adult ADD, doing well on Adderall  ER 30 mg daily and an occasional as needed dose of Adderall  10 mg in the afternoon. UDS today.  #2 recurrent major depressive disorder, GAD. She is stable on Prozac  20 mg a day and Wellbutrin  XL 300 mg a day.   #3 prediabetes. Adherence to diet waxes and wanes.  Weight is down 9 pounds since last visit. Continue 500 mg metformin  daily and check hemoglobin A1c and fasting glucose today.  An After Visit Summary was printed and given to the patient.  FOLLOW UP:  No follow-ups on file.  Signed:  Phil Pricilla Moehle, MD  06/15/2024  

## 2024-06-17 LAB — DRUG MONITORING PANEL 375977 , URINE
Alcohol Metabolites: NEGATIVE ng/mL (ref <500)
Amphetamine: 3182 ng/mL — ABNORMAL HIGH (ref <250)
Amphetamines: POSITIVE ng/mL — AB (ref <500)
Barbiturates: NEGATIVE ng/mL (ref <300)
Benzodiazepines: NEGATIVE ng/mL (ref <100)
Cocaine Metabolite: NEGATIVE ng/mL (ref <150)
Desmethyltramadol: NEGATIVE ng/mL (ref <100)
Marijuana Metabolite: NEGATIVE ng/mL (ref <20)
Methamphetamine: NEGATIVE ng/mL (ref <250)
Opiates: NEGATIVE ng/mL (ref <100)
Oxycodone: NEGATIVE ng/mL (ref <100)
Tramadol: NEGATIVE ng/mL (ref <100)

## 2024-06-17 LAB — DM TEMPLATE

## 2024-06-18 ENCOUNTER — Other Ambulatory Visit (HOSPITAL_BASED_OUTPATIENT_CLINIC_OR_DEPARTMENT_OTHER): Payer: Self-pay

## 2024-06-18 ENCOUNTER — Ambulatory Visit: Payer: Self-pay | Admitting: Family Medicine

## 2024-06-18 MED ORDER — METFORMIN HCL 500 MG PO TABS
500.0000 mg | ORAL_TABLET | Freq: Two times a day (BID) | ORAL | 1 refills | Status: DC
  Filled 2024-06-18 – 2024-07-22 (×5): qty 180, 90d supply, fill #0
  Filled 2024-10-16: qty 180, 90d supply, fill #1

## 2024-06-30 ENCOUNTER — Other Ambulatory Visit (HOSPITAL_BASED_OUTPATIENT_CLINIC_OR_DEPARTMENT_OTHER): Payer: Self-pay

## 2024-06-30 ENCOUNTER — Encounter: Payer: Self-pay | Admitting: Radiology

## 2024-06-30 ENCOUNTER — Ambulatory Visit (INDEPENDENT_AMBULATORY_CARE_PROVIDER_SITE_OTHER): Payer: Commercial Managed Care - PPO | Admitting: Radiology

## 2024-06-30 VITALS — BP 126/72 | HR 83 | Ht 63.75 in | Wt 242.0 lb

## 2024-06-30 DIAGNOSIS — Z01419 Encounter for gynecological examination (general) (routine) without abnormal findings: Secondary | ICD-10-CM

## 2024-06-30 DIAGNOSIS — Z79890 Hormone replacement therapy (postmenopausal): Secondary | ICD-10-CM | POA: Diagnosis not present

## 2024-06-30 DIAGNOSIS — Z13820 Encounter for screening for osteoporosis: Secondary | ICD-10-CM

## 2024-06-30 DIAGNOSIS — Z1331 Encounter for screening for depression: Secondary | ICD-10-CM | POA: Diagnosis not present

## 2024-06-30 MED ORDER — PROGESTERONE MICRONIZED 100 MG PO CAPS
100.0000 mg | ORAL_CAPSULE | Freq: Every day | ORAL | 4 refills | Status: AC
  Filled 2024-06-30: qty 90, 90d supply, fill #0
  Filled 2024-10-16: qty 90, 90d supply, fill #1

## 2024-06-30 MED ORDER — ESTRADIOL 0.075 MG/24HR TD PTTW
1.0000 | MEDICATED_PATCH | TRANSDERMAL | 4 refills | Status: AC
Start: 2024-07-02 — End: ?
  Filled 2024-06-30: qty 24, 84d supply, fill #0
  Filled 2024-09-25: qty 24, 84d supply, fill #1

## 2024-06-30 NOTE — Progress Notes (Signed)
 SAMAURI KELLENBERGER 1961-10-17 [id number]   History: Postmenopausal 63 y.o. presents for annual exam. Doing well on HRT. No gyn concerns.    Gynecologic History Postmenopausal Last Pap: 2023. Results were: normal Last mammogram: 07/05/23. Results were: normal Last colonoscopy: 09/2022   Obstetric History OB History  Gravida Para Term Preterm AB Living  1 1    1   SAB IAB Ectopic Multiple Live Births      1    # Outcome Date GA Lbr Len/2nd Weight Sex Type Anes PTL Lv  1 Para                06/30/2024    9:08 AM 06/15/2024    8:01 AM 11/22/2022   11:16 AM  Depression screen PHQ 2/9  Decreased Interest 0 0 0  Down, Depressed, Hopeless 1 1 1   PHQ - 2 Score 1 1 1   Altered sleeping 1 1 1   Tired, decreased energy 0 1 0  Change in appetite 1 1 1   Feeling bad or failure about yourself  0 1 1  Trouble concentrating 1 0 0  Moving slowly or fidgety/restless 0 0 0  Suicidal thoughts 0 0 0  PHQ-9 Score 4 5 4   Difficult doing work/chores Somewhat difficult Somewhat difficult Somewhat difficult     The following portions of the patient's history were reviewed and updated as appropriate: allergies, current medications, past family history, past medical history, past social history, past surgical history, and problem list.  Review of Systems Pertinent items noted in HPI and remainder of comprehensive ROS otherwise negative.  Past medical history, past surgical history, family history and social history were all reviewed and documented in the EPIC chart.  Exam:  Vitals:   06/30/24 0912  BP: 126/72  Pulse: 83  SpO2: 99%  Weight: 242 lb (109.8 kg)  Height: 5' 3.75 (1.619 m)   Body mass index is 41.87 kg/m.  General appearance:  Normal Thyroid :  Symmetrical, normal in size, without palpable masses or nodularity. Respiratory  Auscultation:  Clear without wheezing or rhonchi Cardiovascular  Auscultation:  Regular rate, without rubs, murmurs or gallops  Edema/varicosities:  Not  grossly evident Abdominal  Soft,nontender, without masses, guarding or rebound.  Liver/spleen:  No organomegaly noted  Hernia:  None appreciated  Skin  Inspection:  Grossly normal Breasts: Examined lying and sitting.   Right: Without masses, retractions, nipple discharge or axillary adenopathy.   Left: Without masses, retractions, nipple discharge or axillary adenopathy. Genitourinary   Inguinal/mons:  Normal without inguinal adenopathy  External genitalia:  Normal appearing vulva with no masses, tenderness, or lesions  BUS/Urethra/Skene's glands:  Normal  Vagina:  Normal appearing with normal color and discharge, no lesions. Atrophy: mild   Cervix:  Normal appearing without discharge or lesions  Uterus:  Normal in size, shape and contour.  Midline and mobile, nontender  Adnexa/parametria:     Rt: Normal in size, without masses or tenderness.   Lt: Normal in size, without masses or tenderness.  Anus and perineum: Normal    Darice Hoit, CMA present for exam  Assessment/Plan:   1. Well female exam with routine gynecological exam (Primary) Pap 2026  2. Screening for osteoporosis - DG Bone Density; Future  3. Hormone replacement therapy (HRT) - estradiol  (VIVELLE -DOT) 0.075 MG/24HR; Place 1 patch onto the skin 2 (two) times a week.  Dispense: 24 patch; Refill: 4 - progesterone  (PROMETRIUM ) 100 MG capsule; Take 1 capsule (100 mg total) by mouth daily.  Dispense: 90 capsule;  Refill: 4  4. Depression screen     Return in 1 year for annual or sooner prn.  Maxximus Gotay B WHNP-BC, 10:28 AM 06/30/2024

## 2024-07-01 ENCOUNTER — Other Ambulatory Visit: Payer: Self-pay | Admitting: Family Medicine

## 2024-07-01 DIAGNOSIS — Z1231 Encounter for screening mammogram for malignant neoplasm of breast: Secondary | ICD-10-CM

## 2024-07-03 ENCOUNTER — Other Ambulatory Visit (HOSPITAL_BASED_OUTPATIENT_CLINIC_OR_DEPARTMENT_OTHER): Payer: Self-pay

## 2024-07-03 ENCOUNTER — Other Ambulatory Visit: Payer: Self-pay | Admitting: Family Medicine

## 2024-07-03 ENCOUNTER — Ambulatory Visit
Admission: RE | Admit: 2024-07-03 | Discharge: 2024-07-03 | Disposition: A | Discharge status: Home / Self Care | Source: Ambulatory Visit | Attending: Family Medicine | Admitting: Family Medicine

## 2024-07-03 DIAGNOSIS — Z1231 Encounter for screening mammogram for malignant neoplasm of breast: Secondary | ICD-10-CM

## 2024-07-03 MED ORDER — BUPROPION HCL ER (XL) 300 MG PO TB24
300.0000 mg | ORAL_TABLET | Freq: Every day | ORAL | 3 refills | Status: AC
  Filled 2024-07-03: qty 90, 90d supply, fill #0
  Filled 2024-10-02 – 2024-10-03 (×2): qty 90, 90d supply, fill #1

## 2024-07-04 ENCOUNTER — Other Ambulatory Visit (HOSPITAL_BASED_OUTPATIENT_CLINIC_OR_DEPARTMENT_OTHER): Payer: Self-pay

## 2024-07-11 ENCOUNTER — Other Ambulatory Visit (HOSPITAL_BASED_OUTPATIENT_CLINIC_OR_DEPARTMENT_OTHER): Payer: Self-pay

## 2024-07-22 ENCOUNTER — Other Ambulatory Visit (HOSPITAL_BASED_OUTPATIENT_CLINIC_OR_DEPARTMENT_OTHER): Payer: Self-pay

## 2024-07-27 ENCOUNTER — Ambulatory Visit
Admission: RE | Admit: 2024-07-27 | Discharge: 2024-07-27 | Disposition: A | Discharge status: Home / Self Care | Source: Ambulatory Visit | Attending: Family Medicine | Admitting: Family Medicine

## 2024-07-27 DIAGNOSIS — Z1231 Encounter for screening mammogram for malignant neoplasm of breast: Secondary | ICD-10-CM | POA: Diagnosis not present

## 2024-07-30 ENCOUNTER — Other Ambulatory Visit: Payer: Self-pay | Admitting: Family Medicine

## 2024-07-30 DIAGNOSIS — R928 Other abnormal and inconclusive findings on diagnostic imaging of breast: Secondary | ICD-10-CM

## 2024-07-31 DIAGNOSIS — G4733 Obstructive sleep apnea (adult) (pediatric): Secondary | ICD-10-CM | POA: Diagnosis not present

## 2024-08-03 ENCOUNTER — Other Ambulatory Visit: Payer: Self-pay | Admitting: Family Medicine

## 2024-08-05 ENCOUNTER — Other Ambulatory Visit (HOSPITAL_BASED_OUTPATIENT_CLINIC_OR_DEPARTMENT_OTHER): Payer: Self-pay

## 2024-08-05 MED ORDER — FLUTICASONE PROPIONATE HFA 110 MCG/ACT IN AERO
2.0000 | INHALATION_SPRAY | Freq: Every day | RESPIRATORY_TRACT | 1 refills | Status: AC
  Filled 2024-08-05: qty 36, 90d supply, fill #0

## 2024-08-07 ENCOUNTER — Other Ambulatory Visit: Payer: Self-pay | Admitting: Family Medicine

## 2024-08-07 ENCOUNTER — Ambulatory Visit
Admission: RE | Admit: 2024-08-07 | Discharge: 2024-08-07 | Disposition: A | Discharge status: Home / Self Care | Source: Ambulatory Visit | Attending: Family Medicine | Admitting: Family Medicine

## 2024-08-07 DIAGNOSIS — R928 Other abnormal and inconclusive findings on diagnostic imaging of breast: Secondary | ICD-10-CM | POA: Diagnosis not present

## 2024-08-07 DIAGNOSIS — N6321 Unspecified lump in the left breast, upper outer quadrant: Secondary | ICD-10-CM

## 2024-08-07 DIAGNOSIS — N6489 Other specified disorders of breast: Secondary | ICD-10-CM | POA: Diagnosis not present

## 2024-08-08 ENCOUNTER — Other Ambulatory Visit (HOSPITAL_BASED_OUTPATIENT_CLINIC_OR_DEPARTMENT_OTHER): Payer: Self-pay

## 2024-08-08 ENCOUNTER — Other Ambulatory Visit: Payer: Self-pay | Admitting: Family Medicine

## 2024-08-10 ENCOUNTER — Other Ambulatory Visit (HOSPITAL_BASED_OUTPATIENT_CLINIC_OR_DEPARTMENT_OTHER): Payer: Self-pay

## 2024-08-10 ENCOUNTER — Other Ambulatory Visit: Payer: Self-pay | Admitting: Medical Genetics

## 2024-08-10 MED ORDER — FLUOXETINE HCL 20 MG PO CAPS
20.0000 mg | ORAL_CAPSULE | Freq: Every day | ORAL | 1 refills | Status: DC
  Filled 2024-08-10: qty 90, 90d supply, fill #0
  Filled 2024-11-09: qty 90, 90d supply, fill #1

## 2024-08-11 ENCOUNTER — Encounter: Payer: Self-pay | Admitting: Family Medicine

## 2024-09-25 ENCOUNTER — Other Ambulatory Visit: Payer: Self-pay

## 2024-09-25 ENCOUNTER — Other Ambulatory Visit (HOSPITAL_BASED_OUTPATIENT_CLINIC_OR_DEPARTMENT_OTHER): Payer: Self-pay

## 2024-10-03 ENCOUNTER — Other Ambulatory Visit (HOSPITAL_BASED_OUTPATIENT_CLINIC_OR_DEPARTMENT_OTHER): Payer: Self-pay

## 2024-10-16 ENCOUNTER — Other Ambulatory Visit: Payer: Self-pay | Admitting: Family Medicine

## 2024-10-16 ENCOUNTER — Other Ambulatory Visit: Payer: Self-pay

## 2024-10-16 NOTE — Telephone Encounter (Signed)
 Requesting: adderall   Contract: 06/15/24 UDS: 06/15/24 Last Visit: 06/15/24 Next Visit: 12/17/24 Last Refill: 06/15/24 (90,0)  Please Advise. Rx pending

## 2024-10-18 MED ORDER — AMPHETAMINE-DEXTROAMPHET ER 30 MG PO CP24
30.0000 mg | ORAL_CAPSULE | Freq: Every day | ORAL | 0 refills | Status: DC
  Filled 2024-10-18 – 2024-10-20 (×2): qty 90, 90d supply, fill #0

## 2024-10-19 ENCOUNTER — Other Ambulatory Visit (HOSPITAL_BASED_OUTPATIENT_CLINIC_OR_DEPARTMENT_OTHER): Payer: Self-pay

## 2024-10-20 ENCOUNTER — Other Ambulatory Visit (HOSPITAL_BASED_OUTPATIENT_CLINIC_OR_DEPARTMENT_OTHER): Payer: Self-pay

## 2024-10-20 ENCOUNTER — Encounter (HOSPITAL_BASED_OUTPATIENT_CLINIC_OR_DEPARTMENT_OTHER): Payer: Self-pay

## 2024-10-20 NOTE — Telephone Encounter (Signed)
 No further action needed at this time.

## 2024-10-21 ENCOUNTER — Other Ambulatory Visit (HOSPITAL_BASED_OUTPATIENT_CLINIC_OR_DEPARTMENT_OTHER): Payer: Self-pay

## 2024-10-23 ENCOUNTER — Other Ambulatory Visit (HOSPITAL_BASED_OUTPATIENT_CLINIC_OR_DEPARTMENT_OTHER): Payer: Self-pay

## 2024-10-23 ENCOUNTER — Other Ambulatory Visit: Payer: Self-pay

## 2024-10-30 DIAGNOSIS — G4733 Obstructive sleep apnea (adult) (pediatric): Secondary | ICD-10-CM | POA: Diagnosis not present

## 2024-12-17 ENCOUNTER — Ambulatory Visit: Admitting: Family Medicine

## 2024-12-17 ENCOUNTER — Other Ambulatory Visit: Payer: Self-pay

## 2024-12-17 ENCOUNTER — Other Ambulatory Visit (HOSPITAL_BASED_OUTPATIENT_CLINIC_OR_DEPARTMENT_OTHER): Payer: Self-pay

## 2024-12-17 ENCOUNTER — Encounter: Payer: Self-pay | Admitting: Family Medicine

## 2024-12-17 VITALS — BP 121/75 | HR 75 | Temp 97.4°F | Ht 63.75 in | Wt 241.0 lb

## 2024-12-17 DIAGNOSIS — Z79899 Other long term (current) drug therapy: Secondary | ICD-10-CM

## 2024-12-17 DIAGNOSIS — R7303 Prediabetes: Secondary | ICD-10-CM

## 2024-12-17 DIAGNOSIS — M25561 Pain in right knee: Secondary | ICD-10-CM

## 2024-12-17 DIAGNOSIS — F988 Other specified behavioral and emotional disorders with onset usually occurring in childhood and adolescence: Secondary | ICD-10-CM

## 2024-12-17 DIAGNOSIS — F3341 Major depressive disorder, recurrent, in partial remission: Secondary | ICD-10-CM

## 2024-12-17 LAB — BASIC METABOLIC PANEL WITH GFR
BUN: 13 mg/dL (ref 6–23)
CO2: 28 meq/L (ref 19–32)
Calcium: 9.4 mg/dL (ref 8.4–10.5)
Chloride: 102 meq/L (ref 96–112)
Creatinine, Ser: 0.8 mg/dL (ref 0.40–1.20)
GFR: 78.71 mL/min
Glucose, Bld: 90 mg/dL (ref 70–99)
Potassium: 4.2 meq/L (ref 3.5–5.1)
Sodium: 138 meq/L (ref 135–145)

## 2024-12-17 LAB — HEMOGLOBIN A1C: Hgb A1c MFr Bld: 6 % (ref 4.6–6.5)

## 2024-12-17 MED ORDER — AMPHETAMINE-DEXTROAMPHETAMINE 10 MG PO TABS
10.0000 mg | ORAL_TABLET | Freq: Every day | ORAL | 0 refills | Status: AC
  Filled 2024-12-17: qty 90, 90d supply, fill #0

## 2024-12-17 MED ORDER — METFORMIN HCL 500 MG PO TABS
500.0000 mg | ORAL_TABLET | Freq: Two times a day (BID) | ORAL | 3 refills | Status: AC
  Filled 2024-12-17: qty 180, 90d supply, fill #0

## 2024-12-17 MED ORDER — AMPHETAMINE-DEXTROAMPHET ER 30 MG PO CP24
30.0000 mg | ORAL_CAPSULE | Freq: Every day | ORAL | 0 refills | Status: AC
  Filled 2024-12-17: qty 90, 90d supply, fill #0

## 2024-12-17 MED ORDER — FLUOXETINE HCL 20 MG PO CAPS
20.0000 mg | ORAL_CAPSULE | Freq: Every day | ORAL | 3 refills | Status: AC
  Filled 2024-12-17: qty 90, 90d supply, fill #0

## 2024-12-17 NOTE — Progress Notes (Signed)
 OFFICE VISIT  12/17/2024  CC:  Chief Complaint  Patient presents with   Medical Management of Chronic Issues    Patient is a 65 y.o. female who presents for 70-month follow-up adult ADD, depression, and prediabetes. A/P as of last visit: #1 adult ADD, doing well on Adderall  ER 30 mg daily and an occasional as needed dose of Adderall  10 mg in the afternoon. UDS today.   #2 recurrent major depressive disorder, GAD. She is stable on Prozac  20 mg a day and Wellbutrin  XL 300 mg a day.   #3 prediabetes. Adherence to diet waxes and wanes.  Weight is down 9 pounds since last visit. Continue 500 mg metformin  daily and check hemoglobin A1c and fasting glucose today.  INTERIM HX: A1c rose to 6.4% last visit.  We increased her metformin  to 500 mg twice a day at that time. She reports no problem with this.   Pt states all is going well with the med at current dosing: much improved focus, concentration, task completion.  Less frustration, better multitasking, less impulsivity and restlessness.  Mood is stable. No side effects from the medication.  PMP AWARE reviewed today: most recent rx for Adderall  XR 30 mg was filled 10/21/2024, #90, prescription by me. Most recent Adderall  10 mg prescription was filled 06/15/2024, #90, prescription by me. No red flags.  The last few weeks that she has noted persistent pain in the right knee.  Describes it as a soreness, particular with walking a long distance or trying to get in and out of her car.  It feels stiff.  Occasionally feels like it might buckle. No catching, no swelling.  She sometimes takes ibuprofen  and it helps.  She has a brace as well. She had a similar problem a few years ago, was seen by sports medicine, was told she had a slight meniscus tear.  The problem went away with some home rehab and rest.  She has never had an injection or any surgical procedure.  ROS as above, plus--> no fevers, no CP, no SOB, no wheezing, no cough, no dizziness,  no HAs, no rashes, no melena/hematochezia.  No polyuria or polydipsia.  No myalgias. No focal weakness, paresthesias, or tremors.  No acute vision or hearing abnormalities.  No dysuria or unusual/new urinary urgency or frequency.  No recent changes in lower legs. No n/v/d or abd pain.  No palpitations.     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 Nicholaus: hasn't seen him in yrs or so, was on elmiron and coldn't tell much difference.   MDD (major depressive disorder)    Mild persistent asthma 08/31/2013   [place] pulm: stable as of pulm f/u 02/2018 and 05/2019: no med changes made---1 yr f/u recommended.   OSA on CPAP 07/2020   Personal history of  adenomatous colonic polyp 10/28/2012   10/2012 - 4x10 mm tubular  adenoma; repeat 09/03/16 showed NO POLYPS.  Recall 5 yrs (08/2021).   Prediabetes 08/2019   08/20/19 HbA1c 6.4%->recommended metformin  at that time.   Sleep apnea July 2021    Past Surgical History:  Procedure Laterality Date   CARDIOVASCULAR STRESS TEST  09/2012   Rest/Stress myoview: normal   CESAREAN SECTION  1988   CHOLECYSTECTOMY  2002   COLONOSCOPY     09/27/22, one adenoma, +diverticulosis and redundant colon.  Recall 7 yrs   COLONOSCOPY W/ POLYPECTOMY  10/22/12; 09/03/16   2013; tubular adenoma, no high grade dysplasia (Dr. Avram).  Repeat 08/2016: no polyps.  Recall 5 yrs (08/2021--Dr. Avram).   CPAP  07/2020   CYSTOSCOPY     with bladder bx 07/22/18-->    EPIGASTRIC HERNIA REPAIR N/A 06/26/2019   Procedure: LAPAROSCOPIC EPIGASTRIC HERNIA REPAIR WITH MESH;  Surgeon: Ethyl Lenis, MD;  Location: [facility];  Service: General;  Laterality: N/A;   HERNIA REPAIR  2003, 2013,  2020   umbilical, during lap chole   POLYPECTOMY     TONSILLECTOMY AND ADENOIDECTOMY  1974   UMBILICAL HERNIA REPAIR  08/19/2012   Procedure: LAPAROSCOPIC UMBILICAL HERNIA;  Surgeon: Lenis VEAR Ethyl, MD;  Location: [facility];  Service: General;  Laterality: N/A;   wisdom teeth removal      Outpatient Medications Prior to Visit  Medication Sig Dispense Refill   acetaminophen  (TYLENOL ) 500 MG tablet Take 1,000 mg by mouth every 6 (six) hours as needed for moderate pain.     buPROPion  (WELLBUTRIN  XL) 300 MG 24 hr tablet Take 1 tablet (300 mg total) by mouth daily. 90 tablet 3   Cinnamon 500 MG capsule Take 500 mg by mouth daily.     estradiol  (VIVELLE -DOT) 0.075 MG/24HR Place 1 patch onto the skin 2 (two) times a week. 24 patch 4   famotidine (PEPCID) 20 MG tablet Take 20 mg by mouth as needed for heartburn or indigestion.     fluticasone  (FLOVENT  HFA) 110 MCG/ACT inhaler Inhale 2 puffs into the lungs daily. 36 g 1   Ibuprofen  (MOTRIN  PO) Take by mouth as needed.     Lactobacillus (ACIDOPHILUS) 100 MG CAPS Take by mouth in the morning and at bedtime.     Melatonin 10 MG CAPS Take by mouth.     mupirocin  ointment (BACTROBAN ) 2 % Apply 1 application topically 3 (three) times daily. (Patient taking differently: Apply 1 application  topically as needed.) 22 g 1   progesterone  (PROMETRIUM ) 100 MG capsule Take 1 capsule (100 mg total) by mouth daily. 90 capsule 4   amphetamine -dextroamphetamine  (ADDERALL  XR) 30 MG 24 hr capsule Take 1 capsule (30 mg total) by mouth daily. 90 capsule 0   amphetamine -dextroamphetamine  (ADDERALL ) 10 MG tablet Take 1 tablet (10 mg total) by mouth daily in the afternoon. 90 tablet 0   FLUoxetine  (PROZAC ) 20 MG capsule Take 1 capsule (20 mg total) by mouth daily.  90 capsule 1   metFORMIN  (GLUCOPHAGE ) 500 MG tablet Take 1 tablet (500 mg total) by mouth 2 (two) times daily. 180 tablet 1   No facility-administered medications prior to visit.    Allergies[1]  Review of Systems As per HPI  PE:    12/17/2024    8:21 AM 06/30/2024    9:12 AM 06/15/2024    7:59 AM  Vitals with BMI  Height 5' 3.75 5' 3.75 5' 5  Weight 241 lbs 242 lbs 245 lbs 10 oz  BMI 41.71 41.88 40.87  Systolic 121 126 884  Diastolic 75 72 75  Pulse 75 83 67     Physical Exam  Gen: Alert, well appearing.  Patient is oriented to person, place, time, and situation. AFFECT: pleasant, lucid thought and speech. CV: RRR, no m/r/g.   LUNGS: CTA bilat, nonlabored resps, good aeration in all lung fields. R knee pain with flexion past 90deg, extension is full. Mild peripatellar TTP medial aspect, also signif focal medial joint line TTP. Otherwise nontender. No effusion.  No instability.  LABS:  Last CBC Lab Results  Component Value Date   WBC 4.5 06/15/2024   HGB 13.1 06/15/2024   HCT 40.9 06/15/2024   MCV 89.7 06/15/2024   MCH 28.3 06/23/2019   RDW 13.6 06/15/2024   PLT 240.0 06/15/2024   Last metabolic panel Lab Results  Component Value Date   GLUCOSE 102 (H) 06/15/2024   NA 137 06/15/2024   K 4.2 06/15/2024   CL 101 06/15/2024   CO2 27 06/15/2024   BUN 17 06/15/2024   CREATININE 0.83 06/15/2024   GFR 75.57 06/15/2024   CALCIUM 9.0 06/15/2024   PROT 6.9 06/15/2024   ALBUMIN 4.0 06/15/2024   BILITOT 0.5 06/15/2024   ALKPHOS 56 06/15/2024   AST 21 06/15/2024   ALT 22 06/15/2024   ANIONGAP 10 04/26/2019   Last lipids Lab Results  Component Value Date   CHOL 232 (H) 06/15/2024   HDL 63.10 06/15/2024   LDLCALC 142 (H) 06/15/2024   LDLDIRECT 155.0 05/23/2023   TRIG 135.0 06/15/2024   CHOLHDL 4 06/15/2024   Last hemoglobin A1c Lab Results  Component Value Date   HGBA1C 6.4 06/15/2024   Last thyroid  functions Lab Results  Component Value Date    TSH 3.76 06/15/2024   IMPRESSION AND PLAN:  #1 adult ADD, doing well on Adderall  ER 30 mg daily and an occasional as needed dose of Adderall  10 mg in the afternoon. RF #90 each today. CSC and UDS UTD.  #2 recurrent major depressive disorder, GAD. She is stable on Prozac  20 mg a day and Wellbutrin  XL 300 mg a day.   #3 prediabetes. Adherence to diet waxes and wanes.  Weight is down 4 more pounds since last visit. Continue 500 mg metformin  twice daily and check hemoglobin A1c and fasting glucose today.  #4 Right knee pain, hx meniscal tear. She may return to see me for further evaluation of this if it is not significantly improved in the next few weeks.  Additionally she may choose to get back in with her sports medicine physician.  An After Visit Summary was printed and given to the patient.  FOLLOW UP: Return in about 6 months (around 06/16/2025) for annual CPE (fasting).  Signed:  Gerlene Hockey, MD           12/17/2024     [1]  Allergies Allergen Reactions   Cipro [Ciprofloxacin Hcl] Itching   Ciprofloxacin Itching

## 2024-12-18 ENCOUNTER — Ambulatory Visit: Payer: Self-pay | Admitting: Family Medicine

## 2025-02-05 ENCOUNTER — Encounter

## 2025-02-05 ENCOUNTER — Other Ambulatory Visit

## 2025-06-22 ENCOUNTER — Encounter: Admitting: Family Medicine

## 2025-06-29 ENCOUNTER — Telehealth: Admitting: Family Medicine

## 2025-07-01 ENCOUNTER — Ambulatory Visit: Admitting: Radiology
